# Patient Record
Sex: Male | Born: 1955 | Race: White | Hispanic: No | Marital: Single | Smoking: Never smoker
Health system: Southern US, Community
[De-identification: ages and names within clinical notes are randomized; demographics above are authoritative.]

## PROBLEM LIST (undated history)

## (undated) DIAGNOSIS — E785 Hyperlipidemia, unspecified: Secondary | ICD-10-CM

## (undated) DIAGNOSIS — R7303 Prediabetes: Secondary | ICD-10-CM

## (undated) DIAGNOSIS — F419 Anxiety disorder, unspecified: Secondary | ICD-10-CM

## (undated) DIAGNOSIS — G20 Parkinson's disease: Secondary | ICD-10-CM

## (undated) DIAGNOSIS — G20A1 Parkinson's disease without dyskinesia, without mention of fluctuations: Secondary | ICD-10-CM

## (undated) DIAGNOSIS — I2510 Atherosclerotic heart disease of native coronary artery without angina pectoris: Secondary | ICD-10-CM

## (undated) DIAGNOSIS — I10 Essential (primary) hypertension: Secondary | ICD-10-CM

## (undated) HISTORY — PX: CHOLECYSTECTOMY: SHX55

## (undated) HISTORY — PX: CORONARY ANGIOPLASTY: SHX604

## (undated) HISTORY — DX: Parkinson's disease: G20

## (undated) HISTORY — DX: Atherosclerotic heart disease of native coronary artery without angina pectoris: I25.10

## (undated) HISTORY — DX: Anxiety disorder, unspecified: F41.9

## (undated) HISTORY — PX: CARDIAC CATHETERIZATION: SHX172

## (undated) HISTORY — PX: TONSILLECTOMY: SUR1361

## (undated) HISTORY — PX: EYE SURGERY: SHX253

## (undated) HISTORY — DX: Hyperlipidemia, unspecified: E78.5

## (undated) HISTORY — DX: Prediabetes: R73.03

## (undated) HISTORY — DX: Essential (primary) hypertension: I10

## (undated) HISTORY — DX: Parkinson's disease without dyskinesia, without mention of fluctuations: G20.A1

## (undated) HISTORY — PX: CORONARY ARTERY BYPASS GRAFT: SHX141

## (undated) HISTORY — PX: APPENDECTOMY: SHX54

## (undated) HISTORY — PX: NASAL SEPTUM SURGERY: SHX37

---

## 2005-12-24 ENCOUNTER — Ambulatory Visit: Payer: Self-pay | Admitting: Family Medicine

## 2006-01-17 ENCOUNTER — Ambulatory Visit: Payer: Self-pay | Admitting: Family Medicine

## 2006-11-01 ENCOUNTER — Ambulatory Visit: Payer: Self-pay | Admitting: Unknown Physician Specialty

## 2007-05-13 ENCOUNTER — Ambulatory Visit: Payer: Self-pay | Admitting: Family Medicine

## 2007-05-16 ENCOUNTER — Ambulatory Visit: Payer: Self-pay | Admitting: Family Medicine

## 2007-06-11 ENCOUNTER — Inpatient Hospital Stay: Payer: Self-pay | Admitting: Internal Medicine

## 2007-06-11 ENCOUNTER — Other Ambulatory Visit: Payer: Self-pay

## 2007-06-12 ENCOUNTER — Other Ambulatory Visit: Payer: Self-pay

## 2007-08-20 ENCOUNTER — Encounter: Payer: Self-pay | Admitting: Cardiology

## 2007-09-03 ENCOUNTER — Encounter: Payer: Self-pay | Admitting: Cardiology

## 2009-07-10 ENCOUNTER — Inpatient Hospital Stay: Payer: Self-pay | Admitting: Internal Medicine

## 2010-12-20 ENCOUNTER — Ambulatory Visit: Payer: Self-pay | Admitting: Family Medicine

## 2012-01-02 ENCOUNTER — Ambulatory Visit: Payer: Self-pay | Admitting: Family Medicine

## 2012-05-02 ENCOUNTER — Ambulatory Visit: Payer: Self-pay | Admitting: Family Medicine

## 2013-07-13 ENCOUNTER — Ambulatory Visit: Payer: Self-pay | Admitting: Family Medicine

## 2014-07-01 ENCOUNTER — Ambulatory Visit: Payer: Self-pay | Admitting: Family Medicine

## 2015-07-04 HISTORY — PX: CAROTID STENT: SHX1301

## 2015-07-12 ENCOUNTER — Ambulatory Visit: Payer: Self-pay

## 2015-07-15 ENCOUNTER — Other Ambulatory Visit: Payer: Self-pay | Admitting: Neurology

## 2015-07-15 DIAGNOSIS — R1310 Dysphagia, unspecified: Secondary | ICD-10-CM

## 2015-07-19 ENCOUNTER — Other Ambulatory Visit: Payer: Self-pay

## 2015-07-25 ENCOUNTER — Encounter: Payer: BLUE CROSS/BLUE SHIELD | Attending: Cardiology | Admitting: *Deleted

## 2015-07-25 ENCOUNTER — Other Ambulatory Visit: Payer: Self-pay | Admitting: *Deleted

## 2015-07-25 VITALS — Ht 70.4 in | Wt 203.3 lb

## 2015-07-25 DIAGNOSIS — Z955 Presence of coronary angioplasty implant and graft: Secondary | ICD-10-CM | POA: Diagnosis not present

## 2015-07-25 NOTE — Patient Instructions (Signed)
Patient Instructions  Patient Details  Name: Sean Barajas MRN: [id number] Date of Birth: 01-28-1956 Referring Provider:  Marcina Millard, MD  Below are the personal goals you chose as well as exercise and nutrition goals. Our goal is to help you keep on track towards obtaining and maintaining your goals. We will be discussing your progress on these goals with you throughout the program.  Initial Exercise Prescription:     Initial Exercise Prescription - 07/25/15 1400    Date of Initial Exercise Prescription   Date 07/25/15   Treadmill   MPH 2.5   Grade 0   Minutes 15   Bike   Level 0.4   Minutes 10   Recumbant Bike   Level 3   RPM 50   Watts 25   Minutes 10   NuStep   Level 3   Watts 40   Minutes 15   Arm Ergometer   Level 1   Watts 8   Minutes 10   Arm/Foot Ergometer   Level 4   Watts 15   Minutes 10   Cybex   Level 1   RPM 50   Minutes 10   Recumbant Elliptical   Level 1   RPM 40   Watts 10   Minutes 10   Elliptical   Level 1   Speed 3   Minutes 1   REL-XR   Level 2   Watts 35   Minutes 15   Prescription Details   Frequency (times per week) 3   Duration Progress to 30 minutes of continuous aerobic without signs/symptoms of physical distress   Intensity   THRR REST +  30   Ratings of Perceived Exertion 11-15   Progression Continue progressive overload as per policy without signs/symptoms or physical distress.   Resistance Training   Training Prescription Yes   Weight 2   Reps 10-15      Exercise Goals: Frequency: Be able to perform aerobic exercise three times per week working toward 3-5 days per week.  Intensity: Work with a perceived exertion of 11 (fairly light) - 15 (hard) as tolerated. Follow your new exercise prescription and watch for changes in prescription as you progress with the program. Changes will be reviewed with you when they are made.  Duration: You should be able to do 30 minutes of continuous aerobic exercise in  addition to a 5 minute warm-up and a 5 minute cool-down routine.  Nutrition Goals: Your personal nutrition goals will be established when you do your nutrition analysis with the dietician.  The following are nutrition guidelines to follow: Cholesterol < /day Sodium < /day Fiber: Men over 50 yrs - 30 grams per day  Personal Goals:     Personal Goals and Risk Factors at Admission - 07/25/15 1531    Personal Goals and Risk Factors on Admission    Weight Management No   Increase Aerobic Exercise and Physical Activity Yes   Intervention While in program, learn and follow the exercise prescription taught. Start at a low level workload and increase workload after able to maintain previous level for 30 minutes. Increase time before increasing intensity.   Quit Smoking No   Understand more about Heart/Pulmonary Disease. Yes   Intervention While in program utilize professionals for any questions, and attend the education sessions. Great websites to use are www.americanheart.org or www.lung.org for reliable information.   Diabetes No   Hypertension Yes   Goal Participant will see blood pressure controlled within the values  of 140/35mm/Hg or within value directed by their physician.   Intervention Provide nutrition & aerobic exercise along with prescribed medications to achieve BP 140/90 or less.   Lipids Yes   Goal Cholesterol controlled with medications as prescribed, with individualized exercise RX and with personalized nutrition plan. Value goals: LDL < 70mg , HDL > 40mg . Participant states understanding of desired cholesterol values and following prescriptions.   Intervention Provide nutrition & aerobic exercise along with prescribed medications to achieve LDL 70mg , HDL >40mg .   Stress Yes   Goal To meet with psychosocial counselor for stress and relaxation information and guidance. To state understanding of performing relaxation techniques and or identifying personal stressors.    Intervention Provide education on types of stress, identifiying stressors, and ways to cope with stress. Provide demonstration and active practice of relaxation techniques.   Personal Goal Other No      Tobacco Use Initial Evaluation: History  Smoking status  . Never Smoker   Smokeless tobacco  . Not on file    Copy of goals given to participant.

## 2015-07-25 NOTE — Progress Notes (Signed)
Cardiac Individual Treatment Plan  Patient Details  Name: Sean Barajas MRN: [id number] Date of Birth: 1956-06-08 Referring Provider:  Marcina Millard, MD  Initial Encounter Date: Date: 07/25/15  Visit Diagnosis: No diagnosis found.  Patient's Home Medications on Admission:  Current outpatient prescriptions:  .  albuterol (PROAIR HFA) 108 (90 BASE) MCG/ACT inhaler, Inhale into the lungs., Disp: , Rfl:  .  ARTIFICIAL TEARS 0.1-0.3 % SOLN, Apply to eye., Disp: , Rfl:  .  aspirin EC 81 MG tablet, Take by mouth., Disp: , Rfl:  .  carbidopa-levodopa (SINEMET IR) 25-100 MG per tablet, Take by mouth., Disp: , Rfl:  .  carvedilol (COREG) 3.125 MG tablet, Take by mouth., Disp: , Rfl:  .  nitroGLYCERIN (NITROSTAT) 0.4 MG SL tablet, Place under the tongue., Disp: , Rfl:  .  rosuvastatin (CRESTOR) 20 MG tablet, Take by mouth., Disp: , Rfl:  .  sildenafil (VIAGRA) 100 MG tablet, Take by mouth., Disp: , Rfl:  .  ticagrelor (BRILINTA) 90 MG TABS tablet, Take by mouth., Disp: , Rfl:  .  traZODone (DESYREL) 50 MG tablet, Take by mouth., Disp: , Rfl:  .  valACYclovir (VALTREX) 500 MG tablet, 500 mg 2 (two) times daily. , Disp: , Rfl:   Past Medical History: No past medical history on file.  Tobacco Use: History  Smoking status  . Not on file  Smokeless tobacco  . Not on file    Labs: Recent Review Flowsheet Data    There is no flowsheet data to display.       Exercise Target Goals: Date: 07/25/15  Exercise Program Goal: Individual exercise prescription set with THRR, safety & activity barriers. Participant demonstrates ability to understand and report RPE using BORG scale, to self-measure pulse accurately, and to acknowledge the importance of the exercise prescription.  Exercise Prescription Goal: Starting with aerobic activity 30 plus minutes a day, 3 days per week for initial exercise prescription. Provide home exercise prescription and guidelines that participant  acknowledges understanding prior to discharge.  Activity Barriers & Risk Stratification:   6 Minute Walk:     6 Minute Walk      07/25/15 1413       6 Minute Walk   Phase Initial     Walk Time 6 minutes     Resting HR 58 bpm     Resting BP 138/84 mmHg        Initial Exercise Prescription:     Initial Exercise Prescription - 07/25/15 1400    Date of Initial Exercise Prescription   Date 07/25/15   Bike   Level 0.4   Minutes 10   Recumbant Bike   Level 3   RPM 50   Watts 25   Minutes 10   NuStep   Level 3   Watts 40   Minutes 15   Arm Ergometer   Level 1   Watts 8   Minutes 10   Arm/Foot Ergometer   Level 4   Watts 15   Minutes 10   Cybex   Level 1   RPM 50   Minutes 10   Recumbant Elliptical   Level 1   RPM 40   Watts 10   Minutes 10   Elliptical   Level 1   Speed 3   Minutes 1   REL-XR   Level 2   Watts 35   Minutes 15      Exercise Prescription Changes:   Discharge Exercise Prescription (Final Exercise Prescription Changes):  Nutrition:  Target Goals: Understanding of nutrition guidelines, daily intake of sodium 1500mg , cholesterol 200mg , calories 30% from fat and 7% or less from saturated fats, daily to have 5 or more servings of fruits and vegetables.  Biometrics:     Pre Biometrics - 07/25/15 1406    Pre Biometrics   Height 5' 10.4" (1.788 m)   Weight 203 lb 4.8 oz (92.216 kg)   Waist Circumference 37.75 inches   Hip Circumference 39.75 inches   Waist to Hip Ratio 0.95 %   BMI (Calculated) 28.9       Nutrition Therapy Plan and Nutrition Goals:   Nutrition Discharge: Rate Your Plate Scores:   Nutrition Goals Re-Evaluation:   Psychosocial: Target Goals: Acknowledge presence or absence of depression, maximize coping skills, provide positive support system. Participant is able to verbalize types and ability to use techniques and skills needed for reducing stress and depression.  Initial Review & Psychosocial  Screening:   Quality of Life Scores:   PHQ-9:     Recent Review Flowsheet Data    There is no flowsheet data to display.      Psychosocial Evaluation and Intervention:   Psychosocial Re-Evaluation:   Vocational Rehabilitation: Provide vocational rehab assistance to qualifying candidates.   Vocational Rehab Evaluation & Intervention:   Education: Education Goals: Education classes will be provided on a weekly basis, covering required topics. Participant will state understanding/return demonstration of topics presented.  Learning Barriers/Preferences:   Education Topics: General Nutrition Guidelines/Fats and Fiber: -Group instruction provided by verbal, written material, models and posters to present the general guidelines for heart healthy nutrition. Gives an explanation and review of dietary fats and fiber.   Controlling Sodium/Reading Food Labels: -Group verbal and written material supporting the discussion of sodium use in heart healthy nutrition. Review and explanation with models, verbal and written materials for utilization of the food label.   Exercise Physiology & Risk Factors: - Group verbal and written instruction with models to review the exercise physiology of the cardiovascular system and associated critical values. Details cardiovascular disease risk factors and the goals associated with each risk factor.   Aerobic Exercise & Resistance Training: - Gives group verbal and written discussion on the health impact of inactivity. On the components of aerobic and resistive training programs and the benefits of this training and how to safely progress through these programs.   Flexibility, Balance, General Exercise Guidelines: - Provides group verbal and written instruction on the benefits of flexibility and balance training programs. Provides general exercise guidelines with specific guidelines to those with heart or lung disease. Demonstration and skill  practice provided.   Stress Management: - Provides group verbal and written instruction about the health risks of elevated stress, cause of high stress, and healthy ways to reduce stress.   Depression: - Provides group verbal and written instruction on the correlation between heart/lung disease and depressed mood, treatment options, and the stigmas associated with seeking treatment.   Anatomy & Physiology of the Heart: - Group verbal and written instruction and models provide basic cardiac anatomy and physiology, with the coronary electrical and arterial systems. Review of: AMI, Angina, Valve disease, Heart Failure, Cardiac Arrhythmia, Pacemakers, and the ICD.   Cardiac Procedures: - Group verbal and written instruction and models to describe the testing methods done to diagnose heart disease. Reviews the outcomes of the test results. Describes the treatment choices: Medical Management, Angioplasty, or Coronary Bypass Surgery.   Cardiac Medications: - Group verbal and written instruction to  review commonly prescribed medications for heart disease. Reviews the medication, class of the drug, and side effects. Includes the steps to properly store meds and maintain the prescription regimen.   Go Sex-Intimacy & Heart Disease, Get SMART - Goal Setting: - Group verbal and written instruction through game format to discuss heart disease and the return to sexual intimacy. Provides group verbal and written material to discuss and apply goal setting through the application of the S.M.A.R.T. Method.   Other Matters of the Heart: - Provides group verbal, written materials and models to describe Heart Failure, Angina, Valve Disease, and Diabetes in the realm of heart disease. Includes description of the disease process and treatment options available to the cardiac patient.   Exercise & Equipment Safety: - Individual verbal instruction and demonstration of equipment use and safety with use of the  equipment.   Infection Prevention: - Provides verbal and written material to individual with discussion of infection control including proper hand washing and proper equipment cleaning during exercise session.   Falls Prevention: - Provides verbal and written material to individual with discussion of falls prevention and safety.   Diabetes: - Individual verbal and written instruction to review signs/symptoms of diabetes, desired ranges of glucose level fasting, after meals and with exercise. Advice that pre and post exercise glucose checks will be done for 3 sessions at entry of program.    Knowledge Questionnaire Score:   Personal Goals and Risk Factors at Admission:   Personal Goals and Risk Factors Review:    Personal Goals Discharge:     Comments: Orientation session today.

## 2015-07-28 DIAGNOSIS — Z955 Presence of coronary angioplasty implant and graft: Secondary | ICD-10-CM | POA: Diagnosis not present

## 2015-07-28 NOTE — Progress Notes (Signed)
Daily Session Note  Patient Details  Name: CARLISLE ENKE MRN: [id number] Date of Birth: [birth year] Referring Provider:  Isaias Cowman, MD  Encounter Date: 07/28/2015  Check In:     Session Check In - 07/28/15 0903    Check-In   Staff Present Lestine Box BS, ACSM EP-C, Exercise Physiologist;Carroll Enterkin RN, BSN;Other   ER physicians immediately available to respond to emergencies See telemetry face sheet for immediately available ER MD   Medication changes reported     No   Fall or balance concerns reported    No   Warm-up and Cool-down Performed on first and last piece of equipment   VAD Patient? No   Pain Assessment   Currently in Pain? No/denies         Goals Met:  Proper associated with RPD/PD & O2 Sat Exercise tolerated well No report of cardiac concerns or symptoms Strength training completed today  Goals Unmet:  Not Applicable  Goals Comments:    Dr. Emily Filbert is Medical Director for [place] and [facility].

## 2015-08-02 ENCOUNTER — Encounter: Payer: BLUE CROSS/BLUE SHIELD | Admitting: *Deleted

## 2015-08-02 DIAGNOSIS — Z955 Presence of coronary angioplasty implant and graft: Secondary | ICD-10-CM

## 2015-08-02 NOTE — Progress Notes (Signed)
Daily Session Note  Patient Details  Name: Mihran M Maldonado MRN: [id number] Date of Birth: 04/17/1956 Referring Provider:  Paraschos, Alexander, MD  Encounter Date: 08/02/2015  Check In:     Session Check In - 08/02/15 1041    Check-In   Staff Present Diane Wright RN, BSN;Renee MacMillan MS, ACSM CEP Exercise Physiologist;Other   ER physicians immediately available to respond to emergencies See telemetry face sheet for immediately available ER MD   Medication changes reported     No   Fall or balance concerns reported    No   Warm-up and Cool-down Performed on first and last piece of equipment   VAD Patient? No   Pain Assessment   Currently in Pain? No/denies   Multiple Pain Sites No         Goals Met:  Independence with exercise equipment Exercise tolerated well No report of cardiac concerns or symptoms Strength training completed today  Goals Unmet:  Not Applicable  Goals Comments:   Dr. Mark Miller is Medical Director for [facility] and [facility]. 

## 2015-08-04 ENCOUNTER — Encounter: Payer: BLUE CROSS/BLUE SHIELD | Attending: Cardiology

## 2015-08-04 DIAGNOSIS — Z955 Presence of coronary angioplasty implant and graft: Secondary | ICD-10-CM

## 2015-08-04 NOTE — Progress Notes (Signed)
Daily Session Note  Patient Details  Name: Sean Barajas MRN: [id number] Date of Birth: [birth year] Referring Provider:  Isaias Cowman, MD  Encounter Date: 08/04/2015  Check In:     Session Check In - 08/04/15 0927    Check-In   Staff Present Lestine Box BS, ACSM EP-C, Exercise Physiologist;Other;Carroll Enterkin RN, BSN   ER physicians immediately available to respond to emergencies See telemetry face sheet for immediately available ER MD   Medication changes reported     No   Fall or balance concerns reported    No   Warm-up and Cool-down Performed on first and last piece of equipment   VAD Patient? No   Pain Assessment   Currently in Pain? No/denies         Goals Met:  Proper associated with RPD/PD & O2 Sat Exercise tolerated well No report of cardiac concerns or symptoms Strength training completed today  Goals Unmet:  Not Applicable  Goals Comments:    Dr. Emily Filbert is Medical Director for [place] and [facility].

## 2015-08-11 DIAGNOSIS — Z955 Presence of coronary angioplasty implant and graft: Secondary | ICD-10-CM | POA: Diagnosis not present

## 2015-08-11 NOTE — Progress Notes (Signed)
Daily Session Note  Patient Details  Name: Sean Barajas MRN: [id number] Date of Birth: Jan 08, 1956 Referring Provider:  Isaias Cowman, MD  Encounter Date: 08/11/2015  Check In:     Session Check In - 08/11/15 0925    Check-In   Staff Present Nyoka Cowden RN;Steven Way BS, ACSM EP-C, Exercise Physiologist;Other   ER physicians immediately available to respond to emergencies See telemetry face sheet for immediately available ER MD   Medication changes reported     No   Fall or balance concerns reported    No   Warm-up and Cool-down Performed on first and last piece of equipment   VAD Patient? No           Exercise Prescription Changes - 08/11/15 0900    Recumbant Bike   Level 4   RPM 65   Minutes 15      Goals Met:  Independence with exercise equipment Exercise tolerated well No report of cardiac concerns or symptoms Strength training completed today  Goals Unmet:  Not Applicable  Goals Comments:    Dr. Emily Filbert is Medical Director for [place] [facility].

## 2015-08-16 ENCOUNTER — Ambulatory Visit: Payer: BLUE CROSS/BLUE SHIELD | Attending: Neurology

## 2015-08-16 ENCOUNTER — Encounter: Payer: Self-pay | Admitting: *Deleted

## 2015-08-16 DIAGNOSIS — Z955 Presence of coronary angioplasty implant and graft: Secondary | ICD-10-CM

## 2015-08-16 DIAGNOSIS — G4733 Obstructive sleep apnea (adult) (pediatric): Secondary | ICD-10-CM | POA: Insufficient documentation

## 2015-08-16 NOTE — Progress Notes (Signed)
Cardiac Individual Treatment Plan  Patient Details  Name: Sean Barajas MRN: [id number] Date of Birth: 04-22-1956 Referring Provider:  Marcina Millard, MD  Initial Encounter Date:    Visit Diagnosis: Stented coronary artery  Patient's Home Medications on Admission:  Current outpatient prescriptions:  .  albuterol (PROAIR HFA) 108 (90 BASE) MCG/ACT inhaler, Inhale into the lungs., Disp: , Rfl:  .  ARTIFICIAL TEARS 0.1-0.3 % SOLN, Apply to eye., Disp: , Rfl:  .  aspirin EC 81 MG tablet, Take by mouth., Disp: , Rfl:  .  carbidopa-levodopa (SINEMET IR) 25-100 MG per tablet, Take by mouth., Disp: , Rfl:  .  carvedilol (COREG) 3.125 MG tablet, Take by mouth., Disp: , Rfl:  .  nitroGLYCERIN (NITROSTAT) 0.4 MG SL tablet, Place under the tongue., Disp: , Rfl:  .  rosuvastatin (CRESTOR) 20 MG tablet, Take by mouth., Disp: , Rfl:  .  sildenafil (VIAGRA) 100 MG tablet, Take by mouth., Disp: , Rfl:  .  ticagrelor (BRILINTA) 90 MG TABS tablet, Take by mouth., Disp: , Rfl:  .  traZODone (DESYREL) 50 MG tablet, Take by mouth., Disp: , Rfl:  .  valACYclovir (VALTREX) 500 MG tablet, 500 mg 2 (two) times daily. , Disp: , Rfl:   Past Medical History: Past Medical History  Diagnosis Date  . Coronary artery disease   . Hypertension   . Hyperlipidemia     Tobacco Use: History  Smoking status  . Never Smoker   Smokeless tobacco  . Not on file    Labs: Recent Review Flowsheet Data    There is no flowsheet data to display.       Exercise Target Goals:    Exercise Program Goal: Individual exercise prescription set with THRR, safety & activity barriers. Participant demonstrates ability to understand and report RPE using BORG scale, to self-measure pulse accurately, and to acknowledge the importance of the exercise prescription.  Exercise Prescription Goal: Starting with aerobic activity 30 plus minutes a day, 3 days per week for initial exercise prescription. Provide home exercise  prescription and guidelines that participant acknowledges understanding prior to discharge.  Activity Barriers & Risk Stratification:     Activity Barriers & Risk Stratification - 07/25/15 1530    Activity Barriers & Risk Stratification   Activity Barriers Back Problems;Balance Concerns;History of Falls   Risk Stratification High      6 Minute Walk:     6 Minute Walk      07/25/15 1413       6 Minute Walk   Phase Initial     Distance 1500 feet     Walk Time 6 minutes     Resting HR 58 bpm     Resting BP 138/84 mmHg     Max Ex. HR 95 bpm     Max Ex. BP 136/80 mmHg     RPE 11     Symptoms No        Initial Exercise Prescription:     Initial Exercise Prescription - 07/25/15 1400    Date of Initial Exercise Prescription   Date 07/25/15   Treadmill   MPH 2.5   Grade 0   Minutes 15   Bike   Level 0.4   Minutes 10   Recumbant Bike   Level 3   RPM 50   Watts 25   Minutes 10   NuStep   Level 3   Watts 40   Minutes 15   Arm Ergometer   Level 1  Watts 8   Minutes 10   Arm/Foot Ergometer   Level 4   Watts 15   Minutes 10   Cybex   Level 1   RPM 50   Minutes 10   Recumbant Elliptical   Level 1   RPM 40   Watts 10   Minutes 10   Elliptical   Level 1   Speed 3   Minutes 1   REL-XR   Level 2   Watts 35   Minutes 15   Prescription Details   Frequency (times per week) 3   Duration Progress to 30 minutes of continuous aerobic without signs/symptoms of physical distress   Intensity   THRR REST +  30   Ratings of Perceived Exertion 11-15   Progression Continue progressive overload as per policy without signs/symptoms or physical distress.   Resistance Training   Training Prescription Yes   Weight 2   Reps 10-15      Exercise Prescription Changes:     Exercise Prescription Changes      08/11/15 0900 08/11/15 1300         Exercise Review   Progression  Yes      Response to Exercise   Blood Pressure (Admit)  118/74 mmHg      Blood  Pressure (Exercise)  146/86 mmHg      Blood Pressure (Exit)  118/64 mmHg      Heart Rate (Admit)  62 bpm      Heart Rate (Exercise)  98 bpm      Heart Rate (Exit)  58 bpm      Rating of Perceived Exertion (Exercise)  12      Duration  Progress to 30 minutes of continuous aerobic without signs/symptoms of physical distress      Intensity  Rest + 30      Progression  Continue progressive overload as per policy without signs/symptoms or physical distress.      Resistance Training   Training Prescription  Yes      Weight  4      Reps  10-15      Interval Training   Interval Training  No      Treadmill   MPH  3      Grade  0      Minutes  15      Recumbant Bike   Level 4 4      RPM 65 65      Minutes 15 15      NuStep   Level  4      Watts  50      Minutes  15         Discharge Exercise Prescription (Final Exercise Prescription Changes):     Exercise Prescription Changes - 08/11/15 1300    Exercise Review   Progression Yes   Response to Exercise   Blood Pressure (Admit) 118/74 mmHg   Blood Pressure (Exercise) 146/86 mmHg   Blood Pressure (Exit) 118/64 mmHg   Heart Rate (Admit) 62 bpm   Heart Rate (Exercise) 98 bpm   Heart Rate (Exit) 58 bpm   Rating of Perceived Exertion (Exercise) 12   Duration Progress to 30 minutes of continuous aerobic without signs/symptoms of physical distress   Intensity Rest + 30   Progression Continue progressive overload as per policy without signs/symptoms or physical distress.   Resistance Training   Training Prescription Yes   Weight 4   Reps 10-15   Interval Training  Interval Training No   Treadmill   MPH 3   Grade 0   Minutes 15   Recumbant Bike   Level 4   RPM 65   Minutes 15   NuStep   Level 4   Watts 50   Minutes 15      Nutrition:  Target Goals: Understanding of nutrition guidelines, daily intake of sodium 1500mg , cholesterol 200mg , calories 30% from fat and 7% or less from saturated fats, daily to have 5 or more  servings of fruits and vegetables.  Biometrics:     Pre Biometrics - 07/25/15 1406    Pre Biometrics   Height 5' 10.4" (1.788 m)   Weight 203 lb 4.8 oz (92.216 kg)   Waist Circumference 37.75 inches   Hip Circumference 39.75 inches   Waist to Hip Ratio 0.95 %   BMI (Calculated) 28.9       Nutrition Therapy Plan and Nutrition Goals:     Nutrition Therapy & Goals - 07/25/15 1529    Nutrition Therapy   Drug/Food Interactions Statins/Certain Fruits   Intervention Plan   Intervention Using nutrition plan and personal goals to gain a healthy nutrition lifestyle. Add exercise as prescribed.      Nutrition Discharge: Rate Your Plate Scores:   Nutrition Goals Re-Evaluation:   Psychosocial: Target Goals: Acknowledge presence or absence of depression, maximize coping skills, provide positive support system. Participant is able to verbalize types and ability to use techniques and skills needed for reducing stress and depression.  Initial Review & Psychosocial Screening:   Quality of Life Scores:     Quality of Life - 07/25/15 1549    Quality of Life Scores   Health/Function Pre 14.67 %   Socioeconomic Pre 24.21 %   Psych/Spiritual Pre 21.07 %   Family Pre 22.5 %   GLOBAL Pre 19 %      PHQ-9:     Recent Review Flowsheet Data    Depression screen [facility] 2/9 07/25/2015   Decreased Interest 2   Down, Depressed, Hopeless 0   PHQ - 2 Score 2   Altered sleeping 3   Tired, decreased energy 3   Change in appetite 0   Feeling bad or failure about yourself  0   Trouble concentrating 0   Moving slowly or fidgety/restless 3   Suicidal thoughts 0   PHQ-9 Score 11   Difficult doing work/chores Somewhat difficult       Psychosocial Evaluation and Intervention:   Psychosocial Re-Evaluation:   Vocational Rehabilitation: Provide vocational rehab assistance to qualifying candidates.   Vocational Rehab Evaluation & Intervention:     Vocational Rehab - 07/25/15 1530     Initial Vocational Rehab Evaluation & Intervention   Assessment shows need for Vocational Rehabilitation No      Education: Education Goals: Education classes will be provided on a weekly basis, covering required topics. Participant will state understanding/return demonstration of topics presented.  Learning Barriers/Preferences:     Learning Barriers/Preferences - 07/25/15 1530    Learning Barriers/Preferences   Learning Barriers None   Learning Preferences None      Education Topics: General Nutrition Guidelines/Fats and Fiber: -Group instruction provided by verbal, written material, models and posters to present the general guidelines for heart healthy nutrition. Gives an explanation and review of dietary fats and fiber.   Controlling Sodium/Reading Food Labels: -Group verbal and written material supporting the discussion of sodium use in heart healthy nutrition. Review and explanation with models, verbal and written materials  for utilization of the food label.   Exercise Physiology & Risk Factors: - Group verbal and written instruction with models to review the exercise physiology of the cardiovascular system and associated critical values. Details cardiovascular disease risk factors and the goals associated with each risk factor.          Cardiac Rehab from 08/11/2015 in [facility] Cardiac Re[facility]   Date  08/11/15   Educator  SW   Instruction Review Code  2- meets goals/outcomes      Aerobic Exercise & Resistance Training: - Gives group verbal and written discussion on the health impact of inactivity. On the components of aerobic and resistive training programs and the benefits of this training and how to safely progress through these programs.   Flexibility, Balance, General Exercise Guidelines: - Provides group verbal and written instruction on the benefits of flexibility and balance training programs. Provides general exercise guidelines with specific guidelines to those with  heart or lung disease. Demonstration and skill practice provided.   Stress Management: - Provides group verbal and written instruction about the health risks of elevated stress, cause of high stress, and healthy ways to reduce stress.   Depression: - Provides group verbal and written instruction on the correlation between heart/lung disease and depressed mood, treatment options, and the stigmas associated with seeking treatment.   Anatomy & Physiology of the Heart: - Group verbal and written instruction and models provide basic cardiac anatomy and physiology, with the coronary electrical and arterial systems. Review of: AMI, Angina, Valve disease, Heart Failure, Cardiac Arrhythmia, Pacemakers, and the ICD.   Cardiac Procedures: - Group verbal and written instruction and models to describe the testing methods done to diagnose heart disease. Reviews the outcomes of the test results. Describes the treatment choices: Medical Management, Angioplasty, or Coronary Bypass Surgery.   Cardiac Medications: - Group verbal and written instruction to review commonly prescribed medications for heart disease. Reviews the medication, class of the drug, and side effects. Includes the steps to properly store meds and maintain the prescription regimen.   Go Sex-Intimacy & Heart Disease, Get SMART - Goal Setting: - Group verbal and written instruction through game format to discuss heart disease and the return to sexual intimacy. Provides group verbal and written material to discuss and apply goal setting through the application of the S.M.A.R.T. Method.   Other Matters of the Heart: - Provides group verbal, written materials and models to describe Heart Failure, Angina, Valve Disease, and Diabetes in the realm of heart disease. Includes description of the disease process and treatment options available to the cardiac patient.   Exercise & Equipment Safety: - Individual verbal instruction and  demonstration of equipment use and safety with use of the equipment.      Cardiac Rehab from 08/11/2015 in [facility] Card[facility]   Date  07/25/15   Educator  C. Enterkin,RN   Instruction Review Code  1- partially meets, needs review/practice      Infection Prevention: - Provides verbal and written material to individual with discussion of infection control including proper hand washing and proper equipment cleaning during exercise session.      Cardiac Rehab from 08/11/2015 in [facility]   Date  07/25/15   Educator  C. Enterkin,RN   Instruction Review Code  2- meets goals/outcomes      Falls Prevention: - Provides verbal and written material to individual with discussion of falls prevention and safety.      Cardiac Rehab from 08/11/2015 in [facility]  Cardiac Rehab   Date  07/25/15   Educator  C. Enterkin,RN   Instruction Review Code  2- meets goals/outcomes      Diabetes: - Individual verbal and written instruction to review signs/symptoms of diabetes, desired ranges of glucose level fasting, after meals and with exercise. Advice that pre and post exercise glucose checks will be done for 3 sessions at entry of program.    Knowledge Questionnaire Score:     Knowledge Questionnaire Score - 07/25/15 1427    Knowledge Questionnaire Score   Pre Score 22      Personal Goals and Risk Factors at Admission:     Personal Goals and Risk Factors at Admission - 07/25/15 1531    Personal Goals and Risk Factors on Admission    Weight Management No   Increase Aerobic Exercise and Physical Activity Yes   Intervention While in program, learn and follow the exercise prescription taught. Start at a low level workload and increase workload after able to maintain previous level for 30 minutes. Increase time before increasing intensity.   Quit Smoking No   Understand more about Heart/Pulmonary Disease. Yes   Intervention While in program utilize professionals for any questions, and attend the  education sessions. Great websites to use are www.americanheart.org or www.lung.org for reliable information.   Diabetes No   Hypertension Yes   Goal Participant will see blood pressure controlled within the values of 140/15mm/Hg or within value directed by their physician.   Intervention Provide nutrition & aerobic exercise along with prescribed medications to achieve BP 140/90 or less.   Lipids Yes   Goal Cholesterol controlled with medications as prescribed, with individualized exercise RX and with personalized nutrition plan. Value goals: LDL < 70mg , HDL > 40mg . Participant states understanding of desired cholesterol values and following prescriptions.   Intervention Provide nutrition & aerobic exercise along with prescribed medications to achieve LDL 70mg , HDL >40mg .   Stress Yes   Goal To meet with psychosocial counselor for stress and relaxation information and guidance. To state understanding of performing relaxation techniques and or identifying personal stressors.   Intervention Provide education on types of stress, identifiying stressors, and ways to cope with stress. Provide demonstration and active practice of relaxation techniques.   Personal Goal Other No      Personal Goals and Risk Factors Review:    Personal Goals Discharge:     Comments: 30 day review  Has attended 3 sessions continue with ITP.

## 2015-08-17 ENCOUNTER — Other Ambulatory Visit: Payer: Self-pay | Admitting: *Deleted

## 2015-08-17 DIAGNOSIS — Z955 Presence of coronary angioplasty implant and graft: Secondary | ICD-10-CM

## 2015-09-06 ENCOUNTER — Encounter: Payer: BLUE CROSS/BLUE SHIELD | Attending: Cardiology

## 2015-09-06 DIAGNOSIS — Z955 Presence of coronary angioplasty implant and graft: Secondary | ICD-10-CM | POA: Insufficient documentation

## 2015-09-08 ENCOUNTER — Telehealth: Payer: Self-pay | Admitting: *Deleted

## 2015-09-08 ENCOUNTER — Encounter: Payer: Self-pay | Admitting: *Deleted

## 2015-09-08 DIAGNOSIS — Z955 Presence of coronary angioplasty implant and graft: Secondary | ICD-10-CM

## 2015-09-08 NOTE — Telephone Encounter (Signed)
I called Sean Barajas and he says he still wants to be on the Cardiac Rehab roster but his MD is sending him for balance retraining for his Parkinson's disease.

## 2015-09-08 NOTE — Progress Notes (Signed)
Cardiac Individual Treatment Plan  Patient Details  Name: Sean Barajas MRN: [id number] Date of Birth: 1956-10-25 Referring Provider:  No ref. provider found  Initial Encounter Date:    Visit Diagnosis: Stented coronary artery  Patient's Home Medications on Admission:  Current outpatient prescriptions:  .  albuterol (PROAIR HFA) 108 (90 BASE) MCG/ACT inhaler, Inhale into the lungs., Disp: , Rfl:  .  ARTIFICIAL TEARS 0.1-0.3 % SOLN, Apply to eye., Disp: , Rfl:  .  aspirin EC 81 MG tablet, Take by mouth., Disp: , Rfl:  .  carbidopa-levodopa (SINEMET IR) 25-100 MG per tablet, Take by mouth., Disp: , Rfl:  .  carvedilol (COREG) 3.125 MG tablet, Take by mouth., Disp: , Rfl:  .  nitroGLYCERIN (NITROSTAT) 0.4 MG SL tablet, Place under the tongue., Disp: , Rfl:  .  rosuvastatin (CRESTOR) 20 MG tablet, Take by mouth., Disp: , Rfl:  .  sildenafil (VIAGRA) 100 MG tablet, Take by mouth., Disp: , Rfl:  .  ticagrelor (BRILINTA) 90 MG TABS tablet, Take by mouth., Disp: , Rfl:  .  traZODone (DESYREL) 50 MG tablet, Take by mouth., Disp: , Rfl:  .  valACYclovir (VALTREX) 500 MG tablet, 500 mg 2 (two) times daily. , Disp: , Rfl:   Past Medical History: Past Medical History  Diagnosis Date  . Coronary artery disease   . Hypertension   . Hyperlipidemia     Tobacco Use: History  Smoking status  . Never Smoker   Smokeless tobacco  . Not on file    Labs: Recent Review Flowsheet Data    There is no flowsheet data to display.       Exercise Target Goals:    Exercise Program Goal: Individual exercise prescription set with THRR, safety & activity barriers. Participant demonstrates ability to understand and report RPE using BORG scale, to self-measure pulse accurately, and to acknowledge the importance of the exercise prescription.  Exercise Prescription Goal: Starting with aerobic activity 30 plus minutes a day, 3 days per week for initial exercise prescription. Provide home exercise  prescription and guidelines that participant acknowledges understanding prior to discharge.  Activity Barriers & Risk Stratification:     Activity Barriers & Risk Stratification - 07/25/15 1530    Activity Barriers & Risk Stratification   Activity Barriers Back Problems;Balance Concerns;History of Falls   Risk Stratification High      6 Minute Walk:     6 Minute Walk      07/25/15 1413       6 Minute Walk   Phase Initial     Distance 1500 feet     Walk Time 6 minutes     Resting HR 58 bpm     Resting BP 138/84 mmHg     Max Ex. HR 95 bpm     Max Ex. BP 136/80 mmHg     RPE 11     Symptoms No        Initial Exercise Prescription:     Initial Exercise Prescription - 07/25/15 1400    Date of Initial Exercise Prescription   Date 07/25/15   Treadmill   MPH 2.5   Grade 0   Minutes 15   Bike   Level 0.4   Minutes 10   Recumbant Bike   Level 3   RPM 50   Watts 25   Minutes 10   NuStep   Level 3   Watts 40   Minutes 15   Arm Ergometer   Level 1  Watts 8   Minutes 10   Arm/Foot Ergometer   Level 4   Watts 15   Minutes 10   Cybex   Level 1   RPM 50   Minutes 10   Recumbant Elliptical   Level 1   RPM 40   Watts 10   Minutes 10   Elliptical   Level 1   Speed 3   Minutes 1   REL-XR   Level 2   Watts 35   Minutes 15   Prescription Details   Frequency (times per week) 3   Duration Progress to 30 minutes of continuous aerobic without signs/symptoms of physical distress   Intensity   THRR REST +  30   Ratings of Perceived Exertion 11-15   Progression Continue progressive overload as per policy without signs/symptoms or physical distress.   Resistance Training   Training Prescription Yes   Weight 2   Reps 10-15      Exercise Prescription Changes:     Exercise Prescription Changes      08/11/15 0900 08/11/15 1300         Exercise Review   Progression  Yes      Response to Exercise   Blood Pressure (Admit)  118/74 mmHg      Blood  Pressure (Exercise)  146/86 mmHg      Blood Pressure (Exit)  118/64 mmHg      Heart Rate (Admit)  62 bpm      Heart Rate (Exercise)  98 bpm      Heart Rate (Exit)  58 bpm      Rating of Perceived Exertion (Exercise)  12      Duration  Progress to 30 minutes of continuous aerobic without signs/symptoms of physical distress      Intensity  Rest + 30      Progression  Continue progressive overload as per policy without signs/symptoms or physical distress.      Resistance Training   Training Prescription  Yes      Weight  4      Reps  10-15      Interval Training   Interval Training  No      Treadmill   MPH  3      Grade  0      Minutes  15      Recumbant Bike   Level 4 4      RPM 65 65      Minutes 15 15      NuStep   Level  4      Watts  50      Minutes  15         Discharge Exercise Prescription (Final Exercise Prescription Changes):     Exercise Prescription Changes - 08/11/15 1300    Exercise Review   Progression Yes   Response to Exercise   Blood Pressure (Admit) 118/74 mmHg   Blood Pressure (Exercise) 146/86 mmHg   Blood Pressure (Exit) 118/64 mmHg   Heart Rate (Admit) 62 bpm   Heart Rate (Exercise) 98 bpm   Heart Rate (Exit) 58 bpm   Rating of Perceived Exertion (Exercise) 12   Duration Progress to 30 minutes of continuous aerobic without signs/symptoms of physical distress   Intensity Rest + 30   Progression Continue progressive overload as per policy without signs/symptoms or physical distress.   Resistance Training   Training Prescription Yes   Weight 4   Reps 10-15   Interval Training  Interval Training No   Treadmill   MPH 3   Grade 0   Minutes 15   Recumbant Bike   Level 4   RPM 65   Minutes 15   NuStep   Level 4   Watts 50   Minutes 15      Nutrition:  Target Goals: Understanding of nutrition guidelines, daily intake of sodium 1500mg , cholesterol 200mg , calories 30% from fat and 7% or less from saturated fats, daily to have 5 or more  servings of fruits and vegetables.  Biometrics:     Pre Biometrics - 07/25/15 1406    Pre Biometrics   Height 5' 10.4" (1.788 m)   Weight 203 lb 4.8 oz (92.216 kg)   Waist Circumference 37.75 inches   Hip Circumference 39.75 inches   Waist to Hip Ratio 0.95 %   BMI (Calculated) 28.9       Nutrition Therapy Plan and Nutrition Goals:     Nutrition Therapy & Goals - 07/25/15 1529    Nutrition Therapy   Drug/Food Interactions Statins/Certain Fruits   Intervention Plan   Intervention Using nutrition plan and personal goals to gain a healthy nutrition lifestyle. Add exercise as prescribed.      Nutrition Discharge: Rate Your Plate Scores:   Nutrition Goals Re-Evaluation:     Nutrition Goals Re-Evaluation      09/08/15 1107           Personal Goal #1 Re-Evaluation   Personal Goal #1 Sanjiv has been dealing with his Parkinson's disease and going to a neurologist who is going to send him for physical therapy. Has been out of Cardiac Rehab since 08/11/2015       Goal Progress Seen No          Psychosocial: Target Goals: Acknowledge presence or absence of depression, maximize coping skills, provide positive support system. Participant is able to verbalize types and ability to use techniques and skills needed for reducing stress and depression.  Initial Review & Psychosocial Screening:   Quality of Life Scores:     Quality of Life - 07/25/15 1549    Quality of Life Scores   Health/Function Pre 14.67 %   Socioeconomic Pre 24.21 %   Psych/Spiritual Pre 21.07 %   Family Pre 22.5 %   GLOBAL Pre 19 %      PHQ-9:     Recent Review Flowsheet Data    Depression screen [facility] 2/9 07/25/2015   Decreased Interest 2   Down, Depressed, Hopeless 0   PHQ - 2 Score 2   Altered sleeping 3   Tired, decreased energy 3   Change in appetite 0   Feeling bad or failure about yourself  0   Trouble concentrating 0   Moving slowly or fidgety/restless 3   Suicidal thoughts 0    PHQ-9 Score 11   Difficult doing work/chores Somewhat difficult       Psychosocial Evaluation and Intervention:   Psychosocial Re-Evaluation:     Psychosocial Re-Evaluation      09/08/15 1109           Psychosocial Re-Evaluation   Interventions Encouraged to attend Cardiac Rehabilitation for the exercise;Stress management education       Comments Stress due to "late stage Parkinson's disease [place] reports". Neal has been dealing with his Parkinson's disease and going to a neurologist who is going to send him for physical therapy. Has been out of Cardiac Rehab since 08/11/2015  Vocational Rehabilitation: Provide vocational rehab assistance to qualifying candidates.   Vocational Rehab Evaluation & Intervention:     Vocational Rehab - 07/25/15 1530    Initial Vocational Rehab Evaluation & Intervention   Assessment shows need for Vocational Rehabilitation No      Education: Education Goals: Education classes will be provided on a weekly basis, covering required topics. Participant will state understanding/return demonstration of topics presented.  Learning Barriers/Preferences:     Learning Barriers/Preferences - 07/25/15 1530    Learning Barriers/Preferences   Learning Barriers None   Learning Preferences None      Education Topics: General Nutrition Guidelines/Fats and Fiber: -Group instruction provided by verbal, written material, models and posters to present the general guidelines for heart healthy nutrition. Gives an explanation and review of dietary fats and fiber.   Controlling Sodium/Reading Food Labels: -Group verbal and written material supporting the discussion of sodium use in heart healthy nutrition. Review and explanation with models, verbal and written materials for utilization of the food label.   Exercise Physiology & Risk Factors: - Group verbal and written instruction with models to review the exercise physiology of the cardiovascular  system and associated critical values. Details cardiovascular disease risk factors and the goals associated with each risk factor.          Cardiac Rehab from 08/11/2015 in [facility] Cardiac Rehab   Date  08/11/15   Educator  SW   Instruction Review Code  2- meets goals/outcomes      Aerobic Exercise & Resistance Training: - Gives group verbal and written discussion on the health impact of inactivity. On the components of aerobic and resistive training programs and the benefits of this training and how to safely progress through these programs.   Flexibility, Balance, General Exercise Guidelines: - Provides group verbal and written instruction on the benefits of flexibility and balance training programs. Provides general exercise guidelines with specific guidelines to those with heart or lung disease. Demonstration and skill practice provided.   Stress Management: - Provides group verbal and written instruction about the health risks of elevated stress, cause of high stress, and healthy ways to reduce stress.   Depression: - Provides group verbal and written instruction on the correlation between heart/lung disease and depressed mood, treatment options, and the stigmas associated with seeking treatment.   Anatomy & Physiology of the Heart: - Group verbal and written instruction and models provide basic cardiac anatomy and physiology, with the coronary electrical and arterial systems. Review of: AMI, Angina, Valve disease, Heart Failure, Cardiac Arrhythmia, Pacemakers, and the ICD.   Cardiac Procedures: - Group verbal and written instruction and models to describe the testing methods done to diagnose heart disease. Reviews the outcomes of the test results. Describes the treatment choices: Medical Management, Angioplasty, or Coronary Bypass Surgery.   Cardiac Medications: - Group verbal and written instruction to review commonly prescribed medications for heart disease. Reviews the  medication, class of the drug, and side effects. Includes the steps to properly store meds and maintain the prescription regimen.   Go Sex-Intimacy & Heart Disease, Get SMART - Goal Setting: - Group verbal and written instruction through game format to discuss heart disease and the return to sexual intimacy. Provides group verbal and written material to discuss and apply goal setting through the application of the S.M.A.R.T. Method.   Other Matters of the Heart: - Provides group verbal, written materials and models to describe Heart Failure, Angina, Valve Disease, and Diabetes in the realm of heart  disease. Includes description of the disease process and treatment options available to the cardiac patient.   Exercise & Equipment Safety: - Individual verbal instruction and demonstration of equipment use and safety with use of the equipment.      Cardiac Rehab from 08/11/2015 in [facility] Cardiac Rehab   Date  07/25/15   Educator  C. Safiatou Islam,RN   Instruction Review Code  1- partially meets, needs review/practice      Infection Prevention: - Provides verbal and written material to individual with discussion of infection control including proper hand washing and proper equipment cleaning during exercise session.      Cardiac Rehab from 08/11/2015 in [facility] Cardiac Rehab   Date  07/25/15   Educator  C. Sanae Willetts,RN   Instruction Review Code  2- meets goals/outcomes      Falls Prevention: - Provides verbal and written material to individual with discussion of falls prevention and safety.      Cardiac Rehab from 08/11/2015 in [facility] Cardiac Rehab   Date  07/25/15   Educator  C. Wah Sabic,RN   Instruction Review Code  2- meets goals/outcomes      Diabetes: - Individual verbal and written instruction to review signs/symptoms of diabetes, desired ranges of glucose level fasting, after meals and with exercise. Advice that pre and post exercise glucose checks will be done for 3 sessions at entry of  program.    Knowledge Questionnaire Score:     Knowledge Questionnaire Score - 07/25/15 1427    Knowledge Questionnaire Score   Pre Score 22      Personal Goals and Risk Factors at Admission:     Personal Goals and Risk Factors at Admission - 07/25/15 1531    Personal Goals and Risk Factors on Admission    Weight Management No   Increase Aerobic Exercise and Physical Activity Yes   Intervention While in program, learn and follow the exercise prescription taught. Start at a low level workload and increase workload after able to maintain previous level for 30 minutes. Increase time before increasing intensity.   Quit Smoking No   Understand more about Heart/Pulmonary Disease. Yes   Intervention While in program utilize professionals for any questions, and attend the education sessions. Great websites to use are www.americanheart.org or www.lung.org for reliable information.   Diabetes No   Hypertension Yes   Goal Participant will see blood pressure controlled within the values of 140/9mm/Hg or within value directed by their physician.   Intervention Provide nutrition & aerobic exercise along with prescribed medications to achieve BP 140/90 or less.   Lipids Yes   Goal Cholesterol controlled with medications as prescribed, with individualized exercise RX and with personalized nutrition plan. Value goals: LDL < , HDL > . Participant states understanding of desired cholesterol values and following prescriptions.   Intervention Provide nutrition & aerobic exercise along with prescribed medications to achieve LDL 70mg , HDL >40mg .   Stress Yes   Goal To meet with psychosocial counselor for stress and relaxation information and guidance. To state understanding of performing relaxation techniques and or identifying personal stressors.   Intervention Provide education on types of stress, identifiying stressors, and ways to cope with stress. Provide demonstration and active practice of  relaxation techniques.   Personal Goal Other No      Personal Goals and Risk Factors Review:      Goals and Risk Factor Review      09/08/15 1108           Increase Aerobic  Exercise and Physical Activity   Goals Progress/Improvement seen  No       Comments Markis has been dealing with his Parkinson's disease and going to a neurologist who is going to send him for physical therapy. Has been out of Cardiac Rehab since 08/11/2015       Hypertension   Goal --  Bp checked at his neurology appt lately.        Stress   Goal --  Isahia has been dealing with his Parkinson's disease and going to a neurologist who is going to send him for physical therapy. Has been out of Cardiac Rehab since 08/11/2015          Personal Goals Discharge (Final Personal Goals and Risk Factors Review):      Goals and Risk Factor Review - 09/08/15 1108    Increase Aerobic Exercise and Physical Activity   Goals Progress/Improvement seen  No   Comments Laquinton has been dealing with his Parkinson's disease and going to a neurologist who is going to send him for physical therapy. Has been out of Cardiac Rehab since 08/11/2015   Hypertension   Goal --  Bp checked at his neurology appt lately.    Stress   Goal --  Earnest has been dealing with his Parkinson's disease and going to a neurologist who is going to send him for physical therapy. Has been out of Cardiac Rehab since 08/11/2015       Comments: Sopheap has been dealing with his Parkinson's disease and going to a neurologist who is going to send him for physical therapy. Has been out of Cardiac Rehab since 08/11/2015

## 2015-09-14 NOTE — Progress Notes (Signed)
Cardiac Individual Treatment Plan  Patient Details  Name: Sean Barajas MRN: [id number] Date of Birth: 1956-10-25 Referring Provider:  No ref. provider found  Initial Encounter Date:    Visit Diagnosis: Stented coronary artery  Patient's Home Medications on Admission:  Current outpatient prescriptions:  .  albuterol (PROAIR HFA) 108 (90 BASE) MCG/ACT inhaler, Inhale into the lungs., Disp: , Rfl:  .  ARTIFICIAL TEARS 0.1-0.3 % SOLN, Apply to eye., Disp: , Rfl:  .  aspirin EC 81 MG tablet, Take by mouth., Disp: , Rfl:  .  carbidopa-levodopa (SINEMET IR) 25-100 MG per tablet, Take by mouth., Disp: , Rfl:  .  carvedilol (COREG) 3.125 MG tablet, Take by mouth., Disp: , Rfl:  .  nitroGLYCERIN (NITROSTAT) 0.4 MG SL tablet, Place under the tongue., Disp: , Rfl:  .  rosuvastatin (CRESTOR) 20 MG tablet, Take by mouth., Disp: , Rfl:  .  sildenafil (VIAGRA) 100 MG tablet, Take by mouth., Disp: , Rfl:  .  ticagrelor (BRILINTA) 90 MG TABS tablet, Take by mouth., Disp: , Rfl:  .  traZODone (DESYREL) 50 MG tablet, Take by mouth., Disp: , Rfl:  .  valACYclovir (VALTREX) 500 MG tablet, 500 mg 2 (two) times daily. , Disp: , Rfl:   Past Medical History: Past Medical History  Diagnosis Date  . Coronary artery disease   . Hypertension   . Hyperlipidemia     Tobacco Use: History  Smoking status  . Never Smoker   Smokeless tobacco  . Not on file    Labs: Recent Review Flowsheet Data    There is no flowsheet data to display.       Exercise Target Goals:    Exercise Program Goal: Individual exercise prescription set with THRR, safety & activity barriers. Participant demonstrates ability to understand and report RPE using BORG scale, to self-measure pulse accurately, and to acknowledge the importance of the exercise prescription.  Exercise Prescription Goal: Starting with aerobic activity 30 plus minutes a day, 3 days per week for initial exercise prescription. Provide home exercise  prescription and guidelines that participant acknowledges understanding prior to discharge.  Activity Barriers & Risk Stratification:     Activity Barriers & Risk Stratification - 07/25/15 1530    Activity Barriers & Risk Stratification   Activity Barriers Back Problems;Balance Concerns;History of Falls   Risk Stratification High      6 Minute Walk:     6 Minute Walk      07/25/15 1413       6 Minute Walk   Phase Initial     Distance 1500 feet     Walk Time 6 minutes     Resting HR 58 bpm     Resting BP 138/84 mmHg     Max Ex. HR 95 bpm     Max Ex. BP 136/80 mmHg     RPE 11     Symptoms No        Initial Exercise Prescription:     Initial Exercise Prescription - 07/25/15 1400    Date of Initial Exercise Prescription   Date 07/25/15   Treadmill   MPH 2.5   Grade 0   Minutes 15   Bike   Level 0.4   Minutes 10   Recumbant Bike   Level 3   RPM 50   Watts 25   Minutes 10   NuStep   Level 3   Watts 40   Minutes 15   Arm Ergometer   Level 1  Watts 8   Minutes 10   Arm/Foot Ergometer   Level 4   Watts 15   Minutes 10   Cybex   Level 1   RPM 50   Minutes 10   Recumbant Elliptical   Level 1   RPM 40   Watts 10   Minutes 10   Elliptical   Level 1   Speed 3   Minutes 1   REL-XR   Level 2   Watts 35   Minutes 15   Prescription Details   Frequency (times per week) 3   Duration Progress to 30 minutes of continuous aerobic without signs/symptoms of physical distress   Intensity   THRR REST +  30   Ratings of Perceived Exertion 11-15   Progression Continue progressive overload as per policy without signs/symptoms or physical distress.   Resistance Training   Training Prescription Yes   Weight 2   Reps 10-15      Exercise Prescription Changes:     Exercise Prescription Changes      08/11/15 0900 08/11/15 1300         Exercise Review   Progression  Yes      Response to Exercise   Blood Pressure (Admit)  118/74 mmHg      Blood  Pressure (Exercise)  146/86 mmHg      Blood Pressure (Exit)  118/64 mmHg      Heart Rate (Admit)  62 bpm      Heart Rate (Exercise)  98 bpm      Heart Rate (Exit)  58 bpm      Rating of Perceived Exertion (Exercise)  12      Duration  Progress to 30 minutes of continuous aerobic without signs/symptoms of physical distress      Intensity  Rest + 30      Progression  Continue progressive overload as per policy without signs/symptoms or physical distress.      Resistance Training   Training Prescription  Yes      Weight  4      Reps  10-15      Interval Training   Interval Training  No      Treadmill   MPH  3      Grade  0      Minutes  15      Recumbant Bike   Level 4 4      RPM 65 65      Minutes 15 15      NuStep   Level  4      Watts  50      Minutes  15         Discharge Exercise Prescription (Final Exercise Prescription Changes):     Exercise Prescription Changes - 08/11/15 1300    Exercise Review   Progression Yes   Response to Exercise   Blood Pressure (Admit) 118/74 mmHg   Blood Pressure (Exercise) 146/86 mmHg   Blood Pressure (Exit) 118/64 mmHg   Heart Rate (Admit) 62 bpm   Heart Rate (Exercise) 98 bpm   Heart Rate (Exit) 58 bpm   Rating of Perceived Exertion (Exercise) 12   Duration Progress to 30 minutes of continuous aerobic without signs/symptoms of physical distress   Intensity Rest + 30   Progression Continue progressive overload as per policy without signs/symptoms or physical distress.   Resistance Training   Training Prescription Yes   Weight 4   Reps 10-15   Interval Training  Interval Training No   Treadmill   MPH 3   Grade 0   Minutes 15   Recumbant Bike   Level 4   RPM 65   Minutes 15   NuStep   Level 4   Watts 50   Minutes 15      Nutrition:  Target Goals: Understanding of nutrition guidelines, daily intake of sodium 1500mg , cholesterol 200mg , calories 30% from fat and 7% or less from saturated fats, daily to have 5 or more  servings of fruits and vegetables.  Biometrics:     Pre Biometrics - 07/25/15 1406    Pre Biometrics   Height 5' 10.4" (1.788 m)   Weight 203 lb 4.8 oz (92.216 kg)   Waist Circumference 37.75 inches   Hip Circumference 39.75 inches   Waist to Hip Ratio 0.95 %   BMI (Calculated) 28.9       Nutrition Therapy Plan and Nutrition Goals:     Nutrition Therapy & Goals - 07/25/15 1529    Nutrition Therapy   Drug/Food Interactions Statins/Certain Fruits   Intervention Plan   Intervention Using nutrition plan and personal goals to gain a healthy nutrition lifestyle. Add exercise as prescribed.      Nutrition Discharge: Rate Your Plate Scores:   Nutrition Goals Re-Evaluation:     Nutrition Goals Re-Evaluation      09/08/15 1107           Personal Goal #1 Re-Evaluation   Personal Goal #1 Mansel has been dealing with his Parkinson's disease and going to a neurologist who is going to send him for physical therapy. Has been out of Cardiac Rehab since 08/11/2015       Goal Progress Seen No          Psychosocial: Target Goals: Acknowledge presence or absence of depression, maximize coping skills, provide positive support system. Participant is able to verbalize types and ability to use techniques and skills needed for reducing stress and depression.  Initial Review & Psychosocial Screening:   Quality of Life Scores:     Quality of Life - 07/25/15 1549    Quality of Life Scores   Health/Function Pre 14.67 %   Socioeconomic Pre 24.21 %   Psych/Spiritual Pre 21.07 %   Family Pre 22.5 %   GLOBAL Pre 19 %      PHQ-9:     Recent Review Flowsheet Data    Depression screen [facility] 2/9 07/25/2015   Decreased Interest 2   Down, Depressed, Hopeless 0   PHQ - 2 Score 2   Altered sleeping 3   Tired, decreased energy 3   Change in appetite 0   Feeling bad or failure about yourself  0   Trouble concentrating 0   Moving slowly or fidgety/restless 3   Suicidal thoughts 0    PHQ-9 Score 11   Difficult doing work/chores Somewhat difficult       Psychosocial Evaluation and Intervention:   Psychosocial Re-Evaluation:     Psychosocial Re-Evaluation      09/08/15 1109           Psychosocial Re-Evaluation   Interventions Encouraged to attend Cardiac Rehabilitation for the exercise;Stress management education       Comments Stress due to "late stage Parkinson's disease Jamaul reports". [place] has been dealing with his Parkinson's disease and going to a neurologist who is going to send him for physical therapy. Has been out of Cardiac Rehab since 08/11/2015  Vocational Rehabilitation: Provide vocational rehab assistance to qualifying candidates.   Vocational Rehab Evaluation & Intervention:     Vocational Rehab - 07/25/15 1530    Initial Vocational Rehab Evaluation & Intervention   Assessment shows need for Vocational Rehabilitation No      Education: Education Goals: Education classes will be provided on a weekly basis, covering required topics. Participant will state understanding/return demonstration of topics presented.  Learning Barriers/Preferences:     Learning Barriers/Preferences - 07/25/15 1530    Learning Barriers/Preferences   Learning Barriers None   Learning Preferences None      Education Topics: General Nutrition Guidelines/Fats and Fiber: -Group instruction provided by verbal, written material, models and posters to present the general guidelines for heart healthy nutrition. Gives an explanation and review of dietary fats and fiber.   Controlling Sodium/Reading Food Labels: -Group verbal and written material supporting the discussion of sodium use in heart healthy nutrition. Review and explanation with models, verbal and written materials for utilization of the food label.   Exercise Physiology & Risk Factors: - Group verbal and written instruction with models to review the exercise physiology of the cardiovascular  system and associated critical values. Details cardiovascular disease risk factors and the goals associated with each risk factor.          Cardiac Rehab from 08/11/2015 in [facility] Cardiac Rehab   Date  08/11/15   Educator  SW   Instruction Review Code  2- meets goals/outcomes      Aerobic Exercise & Resistance Training: - Gives group verbal and written discussion on the health impact of inactivity. On the components of aerobic and resistive training programs and the benefits of this training and how to safely progress through these programs.   Flexibility, Balance, General Exercise Guidelines: - Provides group verbal and written instruction on the benefits of flexibility and balance training programs. Provides general exercise guidelines with specific guidelines to those with heart or lung disease. Demonstration and skill practice provided.   Stress Management: - Provides group verbal and written instruction about the health risks of elevated stress, cause of high stress, and healthy ways to reduce stress.   Depression: - Provides group verbal and written instruction on the correlation between heart/lung disease and depressed mood, treatment options, and the stigmas associated with seeking treatment.   Anatomy & Physiology of the Heart: - Group verbal and written instruction and models provide basic cardiac anatomy and physiology, with the coronary electrical and arterial systems. Review of: AMI, Angina, Valve disease, Heart Failure, Cardiac Arrhythmia, Pacemakers, and the ICD.   Cardiac Procedures: - Group verbal and written instruction and models to describe the testing methods done to diagnose heart disease. Reviews the outcomes of the test results. Describes the treatment choices: Medical Management, Angioplasty, or Coronary Bypass Surgery.   Cardiac Medications: - Group verbal and written instruction to review commonly prescribed medications for heart disease. Reviews the  medication, class of the drug, and side effects. Includes the steps to properly store meds and maintain the prescription regimen.   Go Sex-Intimacy & Heart Disease, Get SMART - Goal Setting: - Group verbal and written instruction through game format to discuss heart disease and the return to sexual intimacy. Provides group verbal and written material to discuss and apply goal setting through the application of the S.M.A.R.T. Method.   Other Matters of the Heart: - Provides group verbal, written materials and models to describe Heart Failure, Angina, Valve Disease, and Diabetes in the realm of heart  disease. Includes description of the disease process and treatment options available to the cardiac patient.   Exercise & Equipment Safety: - Individual verbal instruction and demonstration of equipment use and safety with use of the equipment.      Cardiac Rehab from 08/11/2015 in [facility] Cardiac Rehab   Date  07/25/15   Educator  C. Annakate Soulier,RN   Instruction Review Code  1- partially meets, needs review/practice      Infection Prevention: - Provides verbal and written material to individual with discussion of infection control including proper hand washing and proper equipment cleaning during exercise session.      Cardiac Rehab from 08/11/2015 in [facility] Cardiac Rehab   Date  07/25/15   Educator  C. Justen Fonda,RN   Instruction Review Code  2- meets goals/outcomes      Falls Prevention: - Provides verbal and written material to individual with discussion of falls prevention and safety.      Cardiac Rehab from 08/11/2015 in [facility] Cardiac Rehab   Date  07/25/15   Educator  C. Denzil Bristol,RN   Instruction Review Code  2- meets goals/outcomes      Diabetes: - Individual verbal and written instruction to review signs/symptoms of diabetes, desired ranges of glucose level fasting, after meals and with exercise. Advice that pre and post exercise glucose checks will be done for 3 sessions at entry of  program.    Knowledge Questionnaire Score:     Knowledge Questionnaire Score - 07/25/15 1427    Knowledge Questionnaire Score   Pre Score 22      Personal Goals and Risk Factors at Admission:     Personal Goals and Risk Factors at Admission - 07/25/15 1531    Personal Goals and Risk Factors on Admission    Weight Management No   Increase Aerobic Exercise and Physical Activity Yes   Intervention While in program, learn and follow the exercise prescription taught. Start at a low level workload and increase workload after able to maintain previous level for 30 minutes. Increase time before increasing intensity.   Quit Smoking No   Understand more about Heart/Pulmonary Disease. Yes   Intervention While in program utilize professionals for any questions, and attend the education sessions. Great websites to use are www.americanheart.org or www.lung.org for reliable information.   Diabetes No   Hypertension Yes   Goal Participant will see blood pressure controlled within the values of 140/70mm/Hg or within value directed by their physician.   Intervention Provide nutrition & aerobic exercise along with prescribed medications to achieve BP 140/90 or less.   Lipids Yes   Goal Cholesterol controlled with medications as prescribed, with individualized exercise RX and with personalized nutrition plan. Value goals: LDL < , HDL > . Participant states understanding of desired cholesterol values and following prescriptions.   Intervention Provide nutrition & aerobic exercise along with prescribed medications to achieve LDL 70mg , HDL >40mg .   Stress Yes   Goal To meet with psychosocial counselor for stress and relaxation information and guidance. To state understanding of performing relaxation techniques and or identifying personal stressors.   Intervention Provide education on types of stress, identifiying stressors, and ways to cope with stress. Provide demonstration and active practice of  relaxation techniques.   Personal Goal Other No      Personal Goals and Risk Factors Review:      Goals and Risk Factor Review      09/08/15 1108 09/14/15 1417         Increase Aerobic  Exercise and Physical Activity   Goals Progress/Improvement seen  No No      Comments [name] has been dealing with his Parkinson's disease and going to a neurologist who is going to send him for physical therapy. Has been out of Cardiac Rehab since 08/11/2015 Out a lot.      Hypertension   Goal --  Bp checked at his neurology appt lately.        Stress   Goal --  [name] has been dealing with his Parkinson's disease and going to a neurologist who is going to send him for physical therapy. Has been out of Cardiac Rehab since 08/11/2015          Personal Goals Discharge (Final Personal Goals and Risk Factors Review):      Goals and Risk Factor Review - 09/14/15 1417    Increase Aerobic Exercise and Physical Activity   Goals Progress/Improvement seen  No   Comments Out a lot.       Comments: Has been out due to balance problems with Parkinson's disease.

## 2015-09-14 NOTE — Addendum Note (Signed)
Addended by: Virgina OrganENTERKIN, Alie Hardgrove on: 09/14/2015 02:19 PM   Modules accepted: Orders

## 2015-09-27 ENCOUNTER — Emergency Department
Admission: EM | Admit: 2015-09-27 | Discharge: 2015-09-27 | Disposition: A | Discharge status: Home / Self Care | Payer: BLUE CROSS/BLUE SHIELD | Attending: Emergency Medicine | Admitting: Emergency Medicine

## 2015-09-27 ENCOUNTER — Emergency Department: Payer: BLUE CROSS/BLUE SHIELD

## 2015-09-27 DIAGNOSIS — R4182 Altered mental status, unspecified: Secondary | ICD-10-CM | POA: Insufficient documentation

## 2015-09-27 DIAGNOSIS — T50905A Adverse effect of unspecified drugs, medicaments and biological substances, initial encounter: Secondary | ICD-10-CM

## 2015-09-27 DIAGNOSIS — T424X5A Adverse effect of benzodiazepines, initial encounter: Secondary | ICD-10-CM | POA: Diagnosis not present

## 2015-09-27 DIAGNOSIS — Z7982 Long term (current) use of aspirin: Secondary | ICD-10-CM | POA: Diagnosis not present

## 2015-09-27 DIAGNOSIS — I10 Essential (primary) hypertension: Secondary | ICD-10-CM | POA: Insufficient documentation

## 2015-09-27 DIAGNOSIS — F1210 Cannabis abuse, uncomplicated: Secondary | ICD-10-CM | POA: Insufficient documentation

## 2015-09-27 DIAGNOSIS — Z79899 Other long term (current) drug therapy: Secondary | ICD-10-CM | POA: Diagnosis not present

## 2015-09-27 DIAGNOSIS — F1310 Sedative, hypnotic or anxiolytic abuse, uncomplicated: Secondary | ICD-10-CM | POA: Diagnosis not present

## 2015-09-27 LAB — CBC
HCT: 46.8 % (ref 40.0–52.0)
HEMOGLOBIN: 16.2 g/dL (ref 13.0–18.0)
MCH: 32.3 pg (ref 26.0–34.0)
MCHC: 34.7 g/dL (ref 32.0–36.0)
MCV: 93.2 fL (ref 80.0–100.0)
Platelets: 144 10*3/uL — ABNORMAL LOW (ref 150–440)
RBC: 5.02 MIL/uL (ref 4.40–5.90)
RDW: 13.6 % (ref 11.5–14.5)
WBC: 4.5 10*3/uL (ref 3.8–10.6)

## 2015-09-27 LAB — ETHANOL: Alcohol, Ethyl (B): <5 mg/dL (ref <5)

## 2015-09-27 LAB — COMPREHENSIVE METABOLIC PANEL
ALK PHOS: 95 U/L (ref 38–126)
ALT: 14 U/L — ABNORMAL LOW (ref 17–63)
ANION GAP: 9 (ref 5–15)
AST: 27 U/L (ref 15–41)
Albumin: 5.3 g/dL — ABNORMAL HIGH (ref 3.5–5.0)
BUN: 14 mg/dL (ref 6–20)
CALCIUM: 9.3 mg/dL (ref 8.9–10.3)
CO2: 27 mmol/L (ref 22–32)
Chloride: 102 mmol/L (ref 101–111)
Creatinine, Ser: 0.93 mg/dL (ref 0.61–1.24)
Glucose, Bld: 143 mg/dL — ABNORMAL HIGH (ref 65–99)
Potassium: 3.8 mmol/L (ref 3.5–5.1)
Sodium: 138 mmol/L (ref 135–145)
TOTAL PROTEIN: 8.5 g/dL — AB (ref 6.5–8.1)
Total Bilirubin: 1.4 mg/dL — ABNORMAL HIGH (ref 0.3–1.2)

## 2015-09-27 LAB — URINE DRUG SCREEN, QUALITATIVE (ARMC ONLY)
Amphetamines, Ur Screen: NOT DETECTED
BARBITURATES, UR SCREEN: NOT DETECTED
BENZODIAZEPINE, UR SCRN: POSITIVE — AB
CANNABINOID 50 NG, UR ~~LOC~~: POSITIVE — AB
COCAINE METABOLITE, UR ~~LOC~~: NOT DETECTED
MDMA (Ecstasy)Ur Screen: NOT DETECTED
METHADONE SCREEN, URINE: NOT DETECTED
Opiate, Ur Screen: NOT DETECTED
Phencyclidine (PCP) Ur S: NOT DETECTED
TRICYCLIC, UR SCREEN: NOT DETECTED

## 2015-09-27 LAB — PROTIME-INR
INR: 1.03
Prothrombin Time: 13.7 seconds (ref 11.4–15.0)

## 2015-09-27 LAB — URINALYSIS COMPLETE WITH MICROSCOPIC (ARMC ONLY)
BILIRUBIN URINE: NEGATIVE
Bacteria, UA: NONE SEEN
GLUCOSE, UA: NEGATIVE mg/dL
HGB URINE DIPSTICK: NEGATIVE
KETONES UR: NEGATIVE mg/dL
LEUKOCYTES UA: NEGATIVE
NITRITE: NEGATIVE
Protein, ur: 100 mg/dL — AB
SPECIFIC GRAVITY, URINE: 1.012 (ref 1.005–1.030)
Squamous Epithelial / LPF: NONE SEEN
pH: 7 (ref 5.0–8.0)

## 2015-09-27 LAB — DIFFERENTIAL
BASOS ABS: 0 10*3/uL (ref 0–0.1)
Basophils Relative: 1 %
EOS PCT: 2 %
Eosinophils Absolute: 0.1 10*3/uL (ref 0–0.7)
LYMPHS PCT: 19 %
Lymphs Abs: 0.9 10*3/uL — ABNORMAL LOW (ref 1.0–3.6)
MONO ABS: 0.4 10*3/uL (ref 0.2–1.0)
MONOS PCT: 8 %
NEUTROS PCT: 70 %
Neutro Abs: 3.1 10*3/uL (ref 1.4–6.5)

## 2015-09-27 LAB — SALICYLATE LEVEL: Salicylate Lvl: <4 mg/dL (ref 2.8–30.0)

## 2015-09-27 LAB — ACETAMINOPHEN LEVEL: Acetaminophen (Tylenol), Serum: <10 ug/mL — ABNORMAL LOW (ref 10–30)

## 2015-09-27 LAB — GLUCOSE, CAPILLARY: Glucose-Capillary: 134 mg/dL — ABNORMAL HIGH (ref 65–99)

## 2015-09-27 LAB — APTT: APTT: 28 s (ref 24–36)

## 2015-09-27 LAB — TROPONIN I

## 2015-09-27 NOTE — ED Notes (Signed)
Pt arrived via EMS c/o altered mental status, headache, fatigue, tired, and lethargic. Co-worker reports pt appearing normal this morning, and around 4PM today began acting "different". Negative stroke scale according to EMS. CBG 139, BP 190/100.

## 2015-09-27 NOTE — Progress Notes (Signed)
   09/27/15 1900  Clinical Encounter Type  Visited With Patient and family together  Visit Type Code  Referral From Nurse  Consult/Referral To Chaplain  Responded to Code Stroke in ED.  Pt was responsive, family member present.  Provided pastoral presence and support to patient and family member.  Asbury Automotive GroupChaplain Ravonda Brecheen-pager [phone]

## 2015-09-27 NOTE — ED Provider Notes (Signed)
[facility] Emergency Department Provider Note  ____________________________________________  Time seen: Seen upon arrival to the emergency department  I have reviewed the triage vital signs and the nursing notes.   HISTORY  Chief Complaint Altered Mental Status    HPI Sean Barajas is a 59 y.o. male history of Parkinson's disease was presenting with a sudden onset of weakness that started at 4 PM. The patient says that he feels like he all the energy "sucked out of him." He says that he has been having difficulty sleeping for the past several evenings. Despite this he has still been taking his Xanax.He denies any pain at this time. He says he took Viagra recently but otherwise has not taken any unusual medications or any unusual amount of medications. Denies any nausea vomiting or diarrhea. No chest pain or shortness of breath.   Past Medical History  Diagnosis Date  . Coronary artery disease   . Hypertension   . Hyperlipidemia     There are no active problems to display for this patient.   Past Surgical History  Procedure Laterality Date  . Cardiac catheterization    . Coronary angioplasty    . Coronary artery bypass graft      Current Outpatient Rx  Name  Route  Sig  Dispense  Refill  . acetaminophen (TYLENOL) 500 MG tablet   Oral   Take 1,000 mg by mouth every 6 (six) hours as needed for mild pain.         Marland Kitchen albuterol (PROVENTIL HFA;VENTOLIN HFA) 108 (90 BASE) MCG/ACT inhaler   Inhalation   Inhale 2 puffs into the lungs every 6 (six) hours as needed for wheezing or shortness of breath.         . ALPRAZolam (XANAX) 0.5 MG tablet   Oral   Take 0.5 mg by mouth 2 (two) times daily as needed for anxiety.         . ARTIFICIAL TEAR OP   Ophthalmic   Apply 1 drop to eye as needed (for dry eyes).         Marland Kitchen aspirin EC 81 MG tablet   Oral   Take 81 mg by mouth daily.         . carbidopa-levodopa (SINEMET CR) 50-200 MG tablet    Oral   Take 1 tablet by mouth 2 (two) times daily.          . carbidopa-levodopa (SINEMET IR) 25-100 MG tablet   Oral   Take 1.5 tablets by mouth daily.         . carvedilol (COREG) 3.125 MG tablet   Oral   Take 3.125 mg by mouth 2 (two) times daily.         . nitroGLYCERIN (NITROSTAT) 0.4 MG SL tablet   Sublingual   Place 0.4 mg under the tongue every 5 (five) minutes as needed for chest pain.         . rasagiline (AZILECT) 1 MG TABS tablet   Oral   Take 1 mg by mouth daily.         . rosuvastatin (CRESTOR) 20 MG tablet   Oral   Take 20 mg by mouth at bedtime.         . selegiline (ELDEPRYL) 5 MG capsule   Oral   Take 5 mg by mouth 2 (two) times daily with a meal.         . sildenafil (VIAGRA) 100 MG tablet   Oral  Take 100 mg by mouth daily as needed for erectile dysfunction.         . ticagrelor (BRILINTA) 90 MG TABS tablet   Oral   Take 90 mg by mouth every 12 (twelve) hours.         . traZODone (DESYREL) 50 MG tablet   Oral   Take 50-75 mg by mouth at bedtime.         . valACYclovir (VALTREX) 500 MG tablet   Oral   Take 500 mg by mouth 2 (two) times daily.           Allergies Iodinated diagnostic agents and Sulfa antibiotics  History reviewed. No pertinent family history.  Social History Social History  Substance Use Topics  . Smoking status: Never Smoker   . Smokeless tobacco: None  . Alcohol Use: No    Review of Systems Constitutional: No fever/chills Eyes: No visual changes. ENT: No sore throat. Cardiovascular: Denies chest pain. Respiratory: Denies shortness of breath. Gastrointestinal: No abdominal pain.  No nausea, no vomiting.  No diarrhea.  No constipation. Genitourinary: Negative for dysuria. Musculoskeletal: Negative for back pain. Skin: Negative for rash. Neurological: Negative for headaches, focal weakness or numbness.  10-point ROS otherwise  negative.  ____________________________________________   PHYSICAL EXAM:  VITAL SIGNS: ED Triage Vitals  Enc Vitals Group     BP 09/27/15 1831 186/102 mmHg     Pulse Rate 09/27/15 1831 67     Resp 09/27/15 1831 18     Temp 09/27/15 1831 97.6 F (36.4 C)     Temp Source 09/27/15 1831 Oral     SpO2 09/27/15 1831 98 %     Weight 09/27/15 1831 213 lb (96.616 kg)     Height 09/27/15 1831 5\' 11"  (1.803 m)     Head Cir --      Peak Flow --      Pain Score 09/27/15 1833 3     Pain Loc --      Pain Edu? --      Excl. in GC? --     Constitutional: Alert and oriented. Appears drowsy but responsive to questioning.  Eyes: Conjunctivae are normal. PERRL. EOMI. Head: Atraumatic. Nose: No congestion/rhinnorhea. Mouth/Throat: Mucous membranes are moist.  Oropharynx non-erythematous. Neck: No stridor.   Cardiovascular: Normal rate, regular rhythm. Grossly normal heart sounds.  Good peripheral circulation. Respiratory: Normal respiratory effort.  No retractions. Lungs CTAB. Gastrointestinal: Soft and nontender. No distention. No abdominal bruits. No CVA tenderness. Musculoskeletal: No lower extremity tenderness nor edema.  No joint effusions. Neurologic:  Speech is slow but understandable. Appears to have mild left facial asymmetry with a possible mild left facial droop. However, when he smiles broadly his smile is equal. Skin:  Skin is warm, dry and intact. No rash noted. Psychiatric: Mood and affect are normal. Speech and behavior are normal.  NIH Stroke Scale   Person Administering Scale: Arelia LongestSchaevitz,  Thelda Gagan M  Administer stroke scale items in the order listed. Record performance in each category after each subscale exam. Do not go back and change scores. Follow directions provided for each exam technique. Scores should reflect what the patient does, not what the clinician thinks the patient can do. The clinician should record answers while administering the exam and work quickly. Except  where indicated, the patient should not be coached (i.e., repeated requests to patient to make a special effort).   1a  Level of consciousness: 0=alert; keenly responsive  1b. LOC questions:  0=Performs both  tasks correctly  1c. LOC commands: 0=Performs both tasks correctly  2.  Best Gaze: 0=normal  3.  Visual: 0=No visual loss  4. Facial Palsy: 1=Minor paralysis (flattened nasolabial fold, asymmetric on smiling)  5a.  Motor left arm: 0=No drift, limb holds 90 (or 45) degrees for full 10 seconds  5b.  Motor right arm: 0=No drift, limb holds 90 (or 45) degrees for full 10 seconds  6a. motor left leg: 0=No drift, limb holds 90 (or 45) degrees for full 10 seconds  6b  Motor right leg:  0=No drift, limb holds 90 (or 45) degrees for full 10 seconds  7. Limb Ataxia: 0=Absent  8.  Sensory: 0=Normal; no sensory loss  9. Best Language:  0=No aphasia, normal  10. Dysarthria: 0=Normal  11. Extinction and Inattention: 0=No abnormality  12. Distal motor function: 0=Normal   Total:   1   ____________________________________________   LABS (all labs ordered are listed, but only abnormal results are displayed)  Labs Reviewed  CBC - Abnormal; Notable for the following:    Platelets 144 (*)    All other components within normal limits  DIFFERENTIAL - Abnormal; Notable for the following:    Lymphs Abs 0.9 (*)    All other components within normal limits  COMPREHENSIVE METABOLIC PANEL - Abnormal; Notable for the following:    Glucose, Bld 143 (*)    Total Protein 8.5 (*)    Albumin 5.3 (*)    ALT 14 (*)    Total Bilirubin 1.4 (*)    All other components within normal limits  URINE DRUG SCREEN, QUALITATIVE ([facility] ONLY) - Abnormal; Notable for the following:    Cannabinoid 50 Ng, Ur  POSITIVE (*)    Benzodiazepine, Ur Scrn POSITIVE (*)    All other components within normal limits  URINALYSIS COMPLETEWITH MICROSCOPIC ([facility] ONLY) - Abnormal; Notable for the following:    Color, Urine YELLOW  (*)    APPearance CLEAR (*)    Protein, ur 100 (*)    All other components within normal limits  GLUCOSE, CAPILLARY - Abnormal; Notable for the following:    Glucose-Capillary 134 (*)    All other components within normal limits  ACETAMINOPHEN LEVEL - Abnormal; Notable for the following:    Acetaminophen (Tylenol), Serum <10 (*)    All other components within normal limits  ETHANOL  PROTIME-INR  APTT  TROPONIN I  SALICYLATE LEVEL   ____________________________________________  EKG  EMS EKG  ED ECG REPORT I, Arelia Longest, the attending physician, personally viewed and interpreted this ECG.   Date: 09/27/2015  EKG Time: 1802  Rate: 68  Rhythm: normal sinus rhythm  Axis: Normal axis  Intervals:none  ST&T Change: T-wave inversion in aVL. No ST elevation or depressions. No significant change from 07/10/2009. ____________________________________________  RADIOLOGY  Acute cardiopulmonary disease on the chest x-ray. No acute intracranial abnormality on the CT of the brain. ____________________________________________   PROCEDURES    ____________________________________________   INITIAL IMPRESSION / ASSESSMENT AND PLAN / ED COURSE  Pertinent labs & imaging results that were available during my care of the patient were reviewed by me and considered in my medical decision making (see chart for details).  ----------------------------------------- 8:39 PM on 09/27/2015 -----------------------------------------  I discussed case with Dr. Cyndie Chime of the specialist on-call neurology service who believes that the patient's presentation could be related to his Azilect medication. I then discussed the case with Dr. Wandra Arthurs and of the neurology service who recommends cutting the patient's  dose of Azilect 0.5 mg. Upon reevaluating the patient the patient says that he feels back to normal. His speech as well his affect have now returned normal. He still has no focal weakness  or facial asymmetry with smiling. I discussed with him the findings of his labs as well as imaging workup as well as the conversations I had with the neurologist. He says he has a pill cutter at home and will be able to cut his Azilect in half so that he only takes 0.5 mg per day. The patient also knows that he will follow-up with his neurologist Dr. Malvin Johns and needs call schedule an appointment. He is understanding of the plan and willing to comply. ____________________________________________   FINAL CLINICAL IMPRESSION(S) / ED DIAGNOSES  Acute medication side effect. Acute altered mental status.    Myrna Blazer, MD 09/27/15 [phone]

## 2015-09-28 ENCOUNTER — Encounter: Payer: Self-pay | Admitting: Occupational Therapy

## 2015-09-28 ENCOUNTER — Ambulatory Visit: Payer: BLUE CROSS/BLUE SHIELD | Attending: Neurology | Admitting: Occupational Therapy

## 2015-09-28 DIAGNOSIS — G20 Parkinson's disease: Secondary | ICD-10-CM

## 2015-09-28 DIAGNOSIS — R2681 Unsteadiness on feet: Secondary | ICD-10-CM | POA: Diagnosis present

## 2015-09-28 DIAGNOSIS — R279 Unspecified lack of coordination: Secondary | ICD-10-CM | POA: Diagnosis present

## 2015-09-28 DIAGNOSIS — M6281 Muscle weakness (generalized): Secondary | ICD-10-CM | POA: Diagnosis present

## 2015-09-28 DIAGNOSIS — R262 Difficulty in walking, not elsewhere classified: Secondary | ICD-10-CM | POA: Insufficient documentation

## 2015-09-28 NOTE — Therapy (Signed)
[facility] [address] [place], [place], [address] Phone: [phone]   Fax:  [phone]  Occupational Therapy Evaluation  Patient Details  Name: Sean Barajas MRN: [id number] Date of Birth: 10/08/1956 No Data Recorded  Encounter Date: 09/28/2015      OT End of Session - 09/28/15 1626    Visit Number 1   Number of Visits 17   Date for OT Re-Evaluation 11/07/15   OT Start Time 1100   OT Stop Time 1202   OT Time Calculation (min) 62 min   Activity Tolerance Patient tolerated treatment well   Behavior During Therapy [facility] for tasks assessed/performed      Past Medical History  Diagnosis Date  . Coronary artery disease   . Hypertension   . Hyperlipidemia     Past Surgical History  Procedure Laterality Date  . Cardiac catheterization    . Coronary angioplasty    . Coronary artery bypass graft      There were no vitals filed for this visit.  Visit Diagnosis:  Parkinson's disease (HCC) - Plan: Ot plan of care cert/re-cert  Muscle weakness (generalized) - Plan: Ot plan of care cert/re-cert  Lack of coordination - Plan: Ot plan of care cert/re-cert  Difficulty walking - Plan: Ot plan of care cert/re-cert  Unsteady gait - Plan: Ot plan of care cert/re-cert      Subjective Assessment - 09/28/15 1109    Patient Stated Goals Patient reports he wants to walk without stumbling, better balance.     Currently in Pain? Yes   Pain Score 2    Pain Location Head   Pain Orientation Right   Pain Type Acute pain   Pain Onset Yesterday   Pain Frequency Intermittent   Multiple Pain Sites No           [facility] OT Assessment - 09/28/15 1111    Assessment   Diagnosis Parkinson's disease   Onset Date 04/02/13   Prior Therapy cardiac rehab in 2016, stent placed in august   Balance Screen   Has the patient fallen in the past 6 months Yes   How many times? 12   Has the patient had a decrease in activity level  because of a fear of falling?  No   Is the patient reluctant to leave their home because of a fear of falling?  No   Home  Environment   Family/patient expects to be discharged to: Private residence   Living Arrangements Parent   Available Help at Discharge Family   Type of Home House   Home Access Stairs   Home Layout One level   Bathroom Shower/Tub Tub/Shower unit;Curtain   Bathroom Toilet Standard   Home Equipment [place] Parkane - single point   Additional Comments Pt has difficulty with picking up items from various heights, squatting to perform tasks at home and at work, tends to lose balance posterior, difficulty with walking down uneven surfaces such as small hill and tends to fall forwards.     Lives With Family  mom   Prior Function   Level of Independence Independent   Vocation Full time employment   ADL   Eating/Feeding Modified independent  difficulty swallowing, drinks at room temp   Grooming Modified independent   Lower Body Bathing Increased time   Upper Body Dressing Needs assist for fasteners;Increased time   Lower Body Dressing Increased time;Needs assist for fasteners  decreased balance   Toilet Tranfer Modified independent  Toileting - Clothing Manipulation Increased time   Tub/Shower Transfer Modified independent   IADL   Prior Level of Function Shopping independent   Shopping Takes care of all shopping needs independently   Prior Level of Function Light Housekeeping independent   Light Housekeeping Performs light daily tasks such as dishwashing, bed making   Meal Prep Able to complete simple cold meal and snack prep   Community Mobility Drives own vehicle   Medication Management Is responsible for taking medication in correct dosages at correct time   Prior Level of Function Financial Management independent   Development worker, community financial matters independently (budgets, writes checks, pays rent, bills goes to bank), collects and keeps track of income    Mobility   Mobility Status Independent;History of falls   Written Expression   Dominant Hand Right   Handwriting Not legible   Cognition   Overall Cognitive Status Within Functional Limits for tasks assessed   Memory Appears intact  increased difficulty with short term memory   Sensation   Light Touch Appears Intact  increased numbness bilateral hands but worse on right   Coordination   Gross Motor Movements are Fluid and Coordinated No   Fine Motor Movements are Fluid and Coordinated No   9 Hole Peg Test Right;Left   Right 9 Hole Peg Test 27 secs   Left 9 Hole Peg Test 28 secs   ROM / Strength   AROM / PROM / Strength AROM;Strength   AROM   Overall AROM  Within functional limits for tasks performed   Strength   Overall Strength Deficits   Overall Strength Comments 4/5 overall UE   Hand Function   Right Hand Grip (lbs) 61   Right Hand Lateral Pinch 21 lbs   Right Hand 3 Point Pinch 17 lbs   Left Hand Grip (lbs) 63   Left Hand Lateral Pinch 21 lbs   Left 3 point pinch 17 lbs   Comment 2 point right 13#, left 15#                         OT Education - 09/28/15 1634    Education provided Yes   Education Details LSVT BIG program, role of therapist, answered questions regarding therapy.   Person(s) Educated Patient   Methods Explanation   Comprehension Verbalized understanding             OT Long Term Goals - 09/28/15 1634    OT LONG TERM GOAL #1   Title Patient will improve gait speed and endurance and be able to walk 1500 feet in 6 minutes to negotiate around the home and community safely in 4 weeks.   Baseline 1315 feet at evaluation   Time 4   Period Weeks   Status New   OT LONG TERM GOAL #2   Title Patient will complete HEP for maximal daily exercises with modified independence in 4 weeks   Baseline no current exercise program for parkinson's   Time 4   Period Weeks   Status New   OT LONG TERM GOAL #3   Title Patient will transfer from  sit to stand without the use of arms safely and independently from a variety of chairs/surfaces in 4 weeks.    Baseline difficulty from low surfaces   Time 4   Period Weeks   Status New   OT LONG TERM GOAL #4   Title Patient will decrease frequency of freezing episodes with score  of 8 or less on Freezing of Gait Questionnaire    Baseline score of 9 at evaluation.    OT LONG TERM GOAL #5   Title Patient will be modified independent with home and work tasks including squatting to pick up items from the floor.   Time 4   Period Weeks   Status New               Plan - 10-13-2015 1624    Clinical Impression Statement Patient is a 59 yo male diagnosed with Parkinson's disease and was referred by his physician for LSVT BIG program. Patient presents with repeated falls, decreased step length with gait patterns, decreased reciprocal arm swing, decreased balance, occasional hesitation and freezing of gait with turns, decreased coordination, and muscle strength which affect his ability to perform daily tasks. The patient is judged to be an excellent candidate for the LSVT BIG program. He would benefit from and was referred for the LSVT BIG program which is an intensive program designed specifically for Parkinson's patients with a focus on increasing amplitude and speed of movements, improving self-care and daily tasks and providing patients with daily exercises to improve overall function. It is recommended that the patient receive the LSVT BIG program which is comprised of 16 intensive sessions (4 times per week for 4 weeks, one hour sessions). Prognosis for improvement is good based on his motivation and strong family support. LSVT BIG has been documented in the literature as efficacious for individuals with Parkinson's disease.    Pt will benefit from skilled therapeutic intervention in order to improve on the following deficits (Retired) Abnormal gait;Decreased knowledge of use of DME;Decreased  strength;Decreased balance;Decreased mobility;Difficulty walking;Decreased coordination   Rehab Potential Good   OT Frequency 4x / week   OT Duration 4 weeks   OT Treatment/Interventions Self-care/ADL training;Therapeutic exercise;Functional Mobility Training;Patient/family education;Neuromuscular education;DME and/or AE instruction;Gait Training;Stair Training   Plan Patient to be seen for eval plus 16 tx sessions for a total of 17 sessions.     Recommended Other Services SLP for LSVT LOUD   Consulted and Agree with Plan of Care Patient          G-Codes - 2015-10-13 1647    Functional Assessment Tool Used 6 minute walk, 5 times sit to stand, BERG balance, clinical judgment, freezing of gait questionairre   Functional Limitation Mobility: Walking and moving around   Mobility: Walking and Moving Around Current Status (Z6109) At least 20 percent but less than 40 percent impaired, limited or restricted   Mobility: Walking and Moving Around Goal Status [phone]) At least 1 percent but less than 20 percent impaired, limited or restricted      Problem List There are no active problems to display for this patient.  [name], OTR/L, CLT Gerard Bonus 10/13/15, 4:50 PM  [facility] MAIN [facility] SERVICES [address] [place], [place], [address] Phone: [phone]   Fax:  [phone]  Name: Sean Barajas MRN: [id number] Date of Birth: [birth year]

## 2015-09-29 ENCOUNTER — Ambulatory Visit: Payer: BLUE CROSS/BLUE SHIELD | Attending: Neurology

## 2015-09-29 DIAGNOSIS — G4733 Obstructive sleep apnea (adult) (pediatric): Secondary | ICD-10-CM | POA: Insufficient documentation

## 2015-10-04 ENCOUNTER — Encounter: Payer: BLUE CROSS/BLUE SHIELD | Attending: Cardiology

## 2015-10-04 DIAGNOSIS — Z955 Presence of coronary angioplasty implant and graft: Secondary | ICD-10-CM | POA: Insufficient documentation

## 2015-10-10 ENCOUNTER — Ambulatory Visit: Payer: BLUE CROSS/BLUE SHIELD | Attending: Neurology | Admitting: Occupational Therapy

## 2015-10-10 DIAGNOSIS — M6281 Muscle weakness (generalized): Secondary | ICD-10-CM

## 2015-10-10 DIAGNOSIS — R262 Difficulty in walking, not elsewhere classified: Secondary | ICD-10-CM | POA: Diagnosis present

## 2015-10-10 DIAGNOSIS — R279 Unspecified lack of coordination: Secondary | ICD-10-CM

## 2015-10-10 DIAGNOSIS — G20 Parkinson's disease: Secondary | ICD-10-CM | POA: Diagnosis present

## 2015-10-10 DIAGNOSIS — R2681 Unsteadiness on feet: Secondary | ICD-10-CM | POA: Diagnosis present

## 2015-10-11 ENCOUNTER — Ambulatory Visit: Payer: BLUE CROSS/BLUE SHIELD | Admitting: Occupational Therapy

## 2015-10-11 ENCOUNTER — Telehealth: Payer: Self-pay | Admitting: *Deleted

## 2015-10-11 DIAGNOSIS — M6281 Muscle weakness (generalized): Secondary | ICD-10-CM

## 2015-10-11 DIAGNOSIS — R2681 Unsteadiness on feet: Secondary | ICD-10-CM

## 2015-10-11 DIAGNOSIS — G20 Parkinson's disease: Secondary | ICD-10-CM | POA: Diagnosis not present

## 2015-10-11 DIAGNOSIS — R262 Difficulty in walking, not elsewhere classified: Secondary | ICD-10-CM

## 2015-10-11 DIAGNOSIS — R279 Unspecified lack of coordination: Secondary | ICD-10-CM

## 2015-10-11 NOTE — Therapy (Signed)
[place] [facility] MAIN [facility] SERVICES [address] [place], [place], [address] Phone: [phone]   Fax:  [phone]  Occupational Therapy Treatment  Patient Details  Name: Sean Barajas MRN: [id number] Date of Birth: [birth year] No Data Recorded  Encounter Date: 10/10/2015      OT End of Session - 10/10/15 2035    Visit Number 2   Number of Visits 17   Date for OT Re-Evaluation 11/07/15   OT Start Time 1100   OT Stop Time 1159   OT Time Calculation (min) 59 min   Activity Tolerance Patient tolerated treatment well   Behavior During Therapy [facility] for tasks assessed/performed      Past Medical History  Diagnosis Date  . Coronary artery disease   . Hypertension   . Hyperlipidemia     Past Surgical History  Procedure Laterality Date  . Cardiac catheterization    . Coronary angioplasty    . Coronary artery bypass graft      There were no vitals filed for this visit.  Visit Diagnosis:  Parkinson's disease (HCC)  Muscle weakness (generalized)  Lack of coordination  Difficulty walking  Unsteady gait      Subjective Assessment - 10/10/15 2029    Subjective  Patient reports he had a reaction last week from some of his medication and had to go to the ER.  He feels better now than he did at his initial evaluation.     Patient Stated Goals Patient reports he wants to walk without stumbling, better balance.     Currently in Pain? No/denies   Pain Score 0-No pain   Multiple Pain Sites No      Patient seen for initial instruction of LSVT BIG exercises: LSVT Daily Session Maximal Daily Exercises: Sustained movements are designed to rescale the amplitude of movement output for generalization to daily functional activities. Performed as follows for 1 set of 10 repetitions each: Multi directional sustained movements- 1) Floor to ceiling, 2) Side to side. Multi directional Repetitive movements performed in standing and are designed to  provide retraining effort needed for sustained muscle activation in tasks Performed as follows: 3) Step and reach forward, 4) Step and Reach Backwards, 5) Step and reach sideways, 6) Rock and reach forward/backward, 7) Rock and reach sideways. Sit to stand from mat table on lowest setting with cues for weight shift, technique and CGA for 5 reps for 2 sets. Patient seen for functional mobility tasks this date with emphasis on gait speed, length of steps with short distance ambulation. Patient requires cues for BIG movements and cadenceAll standing exercises performed this date with CGA, verbal and tactile cues as needed.      Other Exercises 2 Functional mobility utilizing LSVT BIG techniques for 2 trials of 250 feet, cues for increasing amplitude of steps as well as reciprocal arm swing.                        OT Treatments/Exercises (OP) - 10/11/15 0001    Transfers   Comments --                OT Education - 10/10/15 2031    Education provided Yes   Education Details LSVT BIG, maximal daily exercises   Person(s) Educated Patient   Methods Explanation;Demonstration;Verbal cues;Tactile cues   Comprehension Verbal cues required;Returned demonstration;Verbalized understanding;Tactile cues required             OT Long Term  Goals - 09/28/15 1634    OT LONG TERM GOAL #1   Title Patient will improve gait speed and endurance and be able to walk 1500 feet in 6 minutes to negotiate around the home and community safely in 4 weeks.   Baseline 1315 feet at evaluation   Time 4   Period Weeks   Status New   OT LONG TERM GOAL #2   Title Patient will complete HEP for maximal daily exercises with modified independence in 4 weeks   Baseline no current exercise program for parkinson's   Time 4   Period Weeks   Status New   OT LONG TERM GOAL #3   Title Patient will transfer from sit to stand without the use of arms safely and independently from a variety of  chairs/surfaces in 4 weeks.    Baseline difficulty from low surfaces   Time 4   Period Weeks   Status New   OT LONG TERM GOAL #4   Title Patient will decrease frequency of freezing episodes with score of 8 or less on Freezing of Gait Questionnaire    Baseline score of 9 at evaluation.    OT LONG TERM GOAL #5   Title Patient will be modified independent with home and work tasks including squatting to pick up items from the floor.   Time 4   Period Weeks   Status New               Plan - 10/10/15 2036    Clinical Impression Statement Patient seen for initial instruction on LSVT BIG maximal daily exercises this session, required moderate cues and CGA for all exercises performed in standing for balance.  Patient will benefit from focus on balance and amplitude of gait in the next sessions to improve these areas for work tasks.   Pt will benefit from skilled therapeutic intervention in order to improve on the following deficits (Retired) Abnormal gait;Decreased knowledge of use of DME;Decreased strength;Decreased balance;Decreased mobility;Difficulty walking;Decreased coordination   Rehab Potential Good   OT Frequency 4x / week   OT Duration 4 weeks   OT Treatment/Interventions Self-care/ADL training;Therapeutic exercise;Functional Mobility Training;Patient/family education;Neuromuscular education;DME and/or AE instruction;Gait Training;Stair Training   Consulted and Agree with Plan of Care Patient        Problem List There are no active problems to display for this patient.  Kerrie Buffalomy T Miranda Frese, OTR/L, CLT Elver Stadler 10/11/2015, 8:48 PM  [place] [facility] [address] [place], [place], [address] Phone: [phone]   Fax:  [phone]  Name: Sean Barajas MRN: [id number] Date of Birth: 05/16/1956

## 2015-10-11 NOTE — Telephone Encounter (Signed)
Called to check on status to return to program. Mr Cleophas DunkerWhitfield plans to return to class this Thursday 11/10

## 2015-10-12 ENCOUNTER — Encounter: Payer: Self-pay | Admitting: *Deleted

## 2015-10-12 ENCOUNTER — Ambulatory Visit: Payer: BLUE CROSS/BLUE SHIELD | Admitting: Occupational Therapy

## 2015-10-12 DIAGNOSIS — R279 Unspecified lack of coordination: Secondary | ICD-10-CM

## 2015-10-12 DIAGNOSIS — G20 Parkinson's disease: Secondary | ICD-10-CM

## 2015-10-12 DIAGNOSIS — Z955 Presence of coronary angioplasty implant and graft: Secondary | ICD-10-CM

## 2015-10-12 DIAGNOSIS — R2681 Unsteadiness on feet: Secondary | ICD-10-CM

## 2015-10-12 DIAGNOSIS — M6281 Muscle weakness (generalized): Secondary | ICD-10-CM

## 2015-10-12 DIAGNOSIS — R262 Difficulty in walking, not elsewhere classified: Secondary | ICD-10-CM

## 2015-10-12 NOTE — Progress Notes (Signed)
Cardiac Individual Treatment Plan  Patient Details  Name: Sean Barajas MRN: [id number] Date of Birth: [birth year] Referring Provider:  Marcina Millard, MD  Initial Encounter Date:    Visit Diagnosis: Stented coronary artery  Patient's Home Medications on Admission:  Current outpatient prescriptions:  .  acetaminophen (TYLENOL) 500 MG tablet, Take 1,000 mg by mouth every 6 (six) hours as needed for mild pain., Disp: , Rfl:  .  albuterol (PROVENTIL HFA;VENTOLIN HFA) 108 (90 BASE) MCG/ACT inhaler, Inhale 2 puffs into the lungs every 6 (six) hours as needed for wheezing or shortness of breath., Disp: , Rfl:  .  ALPRAZolam (XANAX) 0.5 MG tablet, Take 0.5 mg by mouth 2 (two) times daily as needed for anxiety., Disp: , Rfl:  .  ARTIFICIAL TEAR OP, Apply 1 drop to eye as needed (for dry eyes)., Disp: , Rfl:  .  aspirin EC 81 MG tablet, Take 81 mg by mouth daily., Disp: , Rfl:  .  carbidopa-levodopa (SINEMET CR) 50-200 MG tablet, Take 1 tablet by mouth 2 (two) times daily. , Disp: , Rfl:  .  carbidopa-levodopa (SINEMET IR) 25-100 MG tablet, Take 1.5 tablets by mouth daily., Disp: , Rfl:  .  carvedilol (COREG) 3.125 MG tablet, Take 3.125 mg by mouth 2 (two) times daily., Disp: , Rfl:  .  nitroGLYCERIN (NITROSTAT) 0.4 MG SL tablet, Place 0.4 mg under the tongue every 5 (five) minutes as needed for chest pain., Disp: , Rfl:  .  rasagiline (AZILECT) 1 MG TABS tablet, Take 1 mg by mouth daily., Disp: , Rfl:  .  rosuvastatin (CRESTOR) 20 MG tablet, Take 20 mg by mouth at bedtime., Disp: , Rfl:  .  selegiline (ELDEPRYL) 5 MG capsule, Take 5 mg by mouth 2 (two) times daily with a meal., Disp: , Rfl:  .  sildenafil (VIAGRA) 100 MG tablet, Take 100 mg by mouth daily as needed for erectile dysfunction., Disp: , Rfl:  .  ticagrelor (BRILINTA) 90 MG TABS tablet, Take 90 mg by mouth every 12 (twelve) hours., Disp: , Rfl:  .  traZODone (DESYREL) 50 MG tablet, Take 50-75 mg by mouth at bedtime., Disp: ,  Rfl:  .  valACYclovir (VALTREX) 500 MG tablet, Take 500 mg by mouth 2 (two) times daily., Disp: , Rfl:   Past Medical History: Past Medical History  Diagnosis Date  . Coronary artery disease   . Hypertension   . Hyperlipidemia     Tobacco Use: History  Smoking status  . Never Smoker   Smokeless tobacco  . Not on file    Labs: Recent Review Flowsheet Data    There is no flowsheet data to display.       Exercise Target Goals:    Exercise Program Goal: Individual exercise prescription set with THRR, safety & activity barriers. Participant demonstrates ability to understand and report RPE using BORG scale, to self-measure pulse accurately, and to acknowledge the importance of the exercise prescription.  Exercise Prescription Goal: Starting with aerobic activity 30 plus minutes a day, 3 days per week for initial exercise prescription. Provide home exercise prescription and guidelines that participant acknowledges understanding prior to discharge.  Activity Barriers & Risk Stratification:     Activity Barriers & Risk Stratification - 07/25/15 1530    Activity Barriers & Risk Stratification   Activity Barriers Back Problems;Balance Concerns;History of Falls   Risk Stratification High      6 Minute Walk:     6 Minute Walk  07/25/15 1413       6 Minute Walk   Phase Initial     Distance 1500 feet     Walk Time 6 minutes     Resting HR 58 bpm     Resting BP 138/84 mmHg     Max Ex. HR 95 bpm     Max Ex. BP 136/80 mmHg     RPE 11     Symptoms No        Initial Exercise Prescription:     Initial Exercise Prescription - 07/25/15 1400    Date of Initial Exercise Prescription   Date 07/25/15   Treadmill   MPH 2.5   Grade 0   Minutes 15   Bike   Level 0.4   Minutes 10   Recumbant Bike   Level 3   RPM 50   Watts 25   Minutes 10   NuStep   Level 3   Watts 40   Minutes 15   Arm Ergometer   Level 1   Watts 8   Minutes 10   Arm/Foot Ergometer    Level 4   Watts 15   Minutes 10   Cybex   Level 1   RPM 50   Minutes 10   Recumbant Elliptical   Level 1   RPM 40   Watts 10   Minutes 10   Elliptical   Level 1   Speed 3   Minutes 1   REL-XR   Level 2   Watts 35   Minutes 15   Prescription Details   Frequency (times per week) 3   Duration Progress to 30 minutes of continuous aerobic without signs/symptoms of physical distress   Intensity   THRR REST +  30   Ratings of Perceived Exertion 11-15   Progression Continue progressive overload as per policy without signs/symptoms or physical distress.   Resistance Training   Training Prescription Yes   Weight 2   Reps 10-15      Exercise Prescription Changes:     Exercise Prescription Changes      08/11/15 0900 08/11/15 1300         Exercise Review   Progression  Yes      Response to Exercise   Blood Pressure (Admit)  118/74 mmHg      Blood Pressure (Exercise)  146/86 mmHg      Blood Pressure (Exit)  118/64 mmHg      Heart Rate (Admit)  62 bpm      Heart Rate (Exercise)  98 bpm      Heart Rate (Exit)  58 bpm      Rating of Perceived Exertion (Exercise)  12      Duration  Progress to 30 minutes of continuous aerobic without signs/symptoms of physical distress      Intensity  Rest + 30      Progression  Continue progressive overload as per policy without signs/symptoms or physical distress.      Resistance Training   Training Prescription  Yes      Weight  4      Reps  10-15      Interval Training   Interval Training  No      Treadmill   MPH  3      Grade  0      Minutes  15      Recumbant Bike   Level 4 4      RPM 65 65  Minutes 15 15      NuStep   Level  4      Watts  50      Minutes  15         Discharge Exercise Prescription (Final Exercise Prescription Changes):     Exercise Prescription Changes - 08/11/15 1300    Exercise Review   Progression Yes   Response to Exercise   Blood Pressure (Admit) 118/74 mmHg   Blood Pressure (Exercise)  146/86 mmHg   Blood Pressure (Exit) 118/64 mmHg   Heart Rate (Admit) 62 bpm   Heart Rate (Exercise) 98 bpm   Heart Rate (Exit) 58 bpm   Rating of Perceived Exertion (Exercise) 12   Duration Progress to 30 minutes of continuous aerobic without signs/symptoms of physical distress   Intensity Rest + 30   Progression Continue progressive overload as per policy without signs/symptoms or physical distress.   Resistance Training   Training Prescription Yes   Weight 4   Reps 10-15   Interval Training   Interval Training No   Treadmill   MPH 3   Grade 0   Minutes 15   Recumbant Bike   Level 4   RPM 65   Minutes 15   NuStep   Level 4   Watts 50   Minutes 15      Nutrition:  Target Goals: Understanding of nutrition guidelines, daily intake of sodium 1500mg , cholesterol 200mg , calories 30% from fat and 7% or less from saturated fats, daily to have 5 or more servings of fruits and vegetables.  Biometrics:     Pre Biometrics - 07/25/15 1406    Pre Biometrics   Height 5' 10.4" (1.788 m)   Weight 203 lb 4.8 oz (92.216 kg)   Waist Circumference 37.75 inches   Hip Circumference 39.75 inches   Waist to Hip Ratio 0.95 %   BMI (Calculated) 28.9       Nutrition Therapy Plan and Nutrition Goals:     Nutrition Therapy & Goals - 07/25/15 1529    Nutrition Therapy   Drug/Food Interactions Statins/Certain Fruits   Intervention Plan   Intervention Using nutrition plan and personal goals to gain a healthy nutrition lifestyle. Add exercise as prescribed.      Nutrition Discharge: Rate Your Plate Scores:   Nutrition Goals Re-Evaluation:     Nutrition Goals Re-Evaluation      09/08/15 1107           Personal Goal #1 Re-Evaluation   Personal Goal #1 Adriann has been dealing with his Parkinson's disease and going to a neurologist who is going to send him for physical therapy. Has been out of Cardiac Rehab since 08/11/2015       Goal Progress Seen No           Psychosocial: Target Goals: Acknowledge presence or absence of depression, maximize coping skills, provide positive support system. Participant is able to verbalize types and ability to use techniques and skills needed for reducing stress and depression.  Initial Review & Psychosocial Screening:   Quality of Life Scores:     Quality of Life - 07/25/15 1549    Quality of Life Scores   Health/Function Pre 14.67 %   Socioeconomic Pre 24.21 %   Psych/Spiritual Pre 21.07 %   Family Pre 22.5 %   GLOBAL Pre 19 %      PHQ-9:     Recent Review Flowsheet Data    Depression screen [facility] 2/9 07/25/2015  Decreased Interest 2   Down, Depressed, Hopeless 0   PHQ - 2 Score 2   Altered sleeping 3   Tired, decreased energy 3   Change in appetite 0   Feeling bad or failure about yourself  0   Trouble concentrating 0   Moving slowly or fidgety/restless 3   Suicidal thoughts 0   PHQ-9 Score 11   Difficult doing work/chores Somewhat difficult       Psychosocial Evaluation and Intervention:   Psychosocial Re-Evaluation:     Psychosocial Re-Evaluation      09/08/15 1109           Psychosocial Re-Evaluation   Interventions Encouraged to attend Cardiac Rehabilitation for the exercise;Stress management education       Comments Stress due to "late stage Parkinson's disease Rea reports". Cesare has been dealing with his Parkinson's disease and going to a neurologist who is going to send him for physical therapy. Has been out of Cardiac Rehab since 08/11/2015          Vocational Rehabilitation: Provide vocational rehab assistance to qualifying candidates.   Vocational Rehab Evaluation & Intervention:     Vocational Rehab - 07/25/15 1530    Initial Vocational Rehab Evaluation & Intervention   Assessment shows need for Vocational Rehabilitation No      Education: Education Goals: Education classes will be provided on a weekly basis, covering required topics. Participant  will state understanding/return demonstration of topics presented.  Learning Barriers/Preferences:     Learning Barriers/Preferences - 07/25/15 1530    Learning Barriers/Preferences   Learning Barriers None   Learning Preferences None      Education Topics: General Nutrition Guidelines/Fats and Fiber: -Group instruction provided by verbal, written material, models and posters to present the general guidelines for heart healthy nutrition. Gives an explanation and review of dietary fats and fiber.   Controlling Sodium/Reading Food Labels: -Group verbal and written material supporting the discussion of sodium use in heart healthy nutrition. Review and explanation with models, verbal and written materials for utilization of the food label.   Exercise Physiology & Risk Factors: - Group verbal and written instruction with models to review the exercise physiology of the cardiovascular system and associated critical values. Details cardiovascular disease risk factors and the goals associated with each risk factor.          Cardiac Rehab from 08/11/2015 in [facility] Cardiac Rehab   Date  08/11/15   Educator  SW   Instruction Review Code  2- meets goals/outcomes      Aerobic Exercise & Resistance Training: - Gives group verbal and written discussion on the health impact of inactivity. On the components of aerobic and resistive training programs and the benefits of this training and how to safely progress through these programs.   Flexibility, Balance, General Exercise Guidelines: - Provides group verbal and written instruction on the benefits of flexibility and balance training programs. Provides general exercise guidelines with specific guidelines to those with heart or lung disease. Demonstration and skill practice provided.   Stress Management: - Provides group verbal and written instruction about the health risks of elevated stress, cause of high stress, and healthy ways to reduce  stress.   Depression: - Provides group verbal and written instruction on the correlation between heart/lung disease and depressed mood, treatment options, and the stigmas associated with seeking treatment.   Anatomy & Physiology of the Heart: - Group verbal and written instruction and models provide basic cardiac anatomy and physiology, with  the coronary electrical and arterial systems. Review of: AMI, Angina, Valve disease, Heart Failure, Cardiac Arrhythmia, Pacemakers, and the ICD.   Cardiac Procedures: - Group verbal and written instruction and models to describe the testing methods done to diagnose heart disease. Reviews the outcomes of the test results. Describes the treatment choices: Medical Management, Angioplasty, or Coronary Bypass Surgery.   Cardiac Medications: - Group verbal and written instruction to review commonly prescribed medications for heart disease. Reviews the medication, class of the drug, and side effects. Includes the steps to properly store meds and maintain the prescription regimen.   Go Sex-Intimacy & Heart Disease, Get SMART - Goal Setting: - Group verbal and written instruction through game format to discuss heart disease and the return to sexual intimacy. Provides group verbal and written material to discuss and apply goal setting through the application of the S.M.A.R.T. Method.   Other Matters of the Heart: - Provides group verbal, written materials and models to describe Heart Failure, Angina, Valve Disease, and Diabetes in the realm of heart disease. Includes description of the disease process and treatment options available to the cardiac patient.   Exercise & Equipment Safety: - Individual verbal instruction and demonstration of equipment use and safety with use of the equipment.      Cardiac Rehab from 08/11/2015 in [facility] Cardiac Rehab   Date  07/25/15   Educator  C. Enterkin,RN   Instruction Review Code  1- partially meets, needs review/practice       Infection Prevention: - Provides verbal and written material to individual with discussion of infection control including proper hand washing and proper equipment cleaning during exercise session.      [facility]   Date  07/25/15   Educator  C. Enterkin,RN   Instruction Review Code  2- meets goals/outcomes      Falls Prevention: - Provides verbal and written material to individual with discussion of falls prevention and safety.      [facility]   Date  07/25/15   Educator  C. Enterkin,RN   Instruction Review Code  2- meets goals/outcomes      Diabetes: - Individual verbal and written instruction to review signs/symptoms of diabetes, desired ranges of glucose level fasting, after meals and with exercise. Advice that pre and post exercise glucose checks will be done for 3 sessions at entry of program.    Knowledge Questionnaire Score:     Knowledge Questionnaire Score - 07/25/15 1427    Knowledge Questionnaire Score   Pre Score 22      Personal Goals and Risk Factors at Admission:     Personal Goals and Risk Factors at Admission - 07/25/15 1531    Personal Goals and Risk Factors on Admission    Weight Management No   Increase Aerobic Exercise and Physical Activity Yes   Intervention While in program, learn and follow the exercise prescription taught. Start at a low level workload and increase workload after able to maintain previous level for 30 minutes. Increase time before increasing intensity.   Quit Smoking No   Understand more about Heart/Pulmonary Disease. Yes   Intervention While in program utilize professionals for any questions, and attend the education sessions. Great websites to use are www.americanheart.org or www.lung.org for reliable information.   Diabetes No   Hypertension Yes   Goal Participant will see blood pressure controlled within the values of 140/5090mm/Hg or within  value directed by their  physician.   Intervention Provide nutrition & aerobic exercise along with prescribed medications to achieve BP 140/90 or less.   Lipids Yes   Goal Cholesterol controlled with medications as prescribed, with individualized exercise RX and with personalized nutrition plan. Value goals: LDL < 70mg , HDL > 40mg . Participant states understanding of desired cholesterol values and following prescriptions.   Intervention Provide nutrition & aerobic exercise along with prescribed medications to achieve LDL 70mg , HDL >40mg .   Stress Yes   Goal To meet with psychosocial counselor for stress and relaxation information and guidance. To state understanding of performing relaxation techniques and or identifying personal stressors.   Intervention Provide education on types of stress, identifiying stressors, and ways to cope with stress. Provide demonstration and active practice of relaxation techniques.   Personal Goal Other No      Personal Goals and Risk Factors Review:      Goals and Risk Factor Review      09/08/15 1108 09/14/15 1417         Increase Aerobic Exercise and Physical Activity   Goals Progress/Improvement seen  No No      Comments [name] has been dealing with his Parkinson's disease and going to a neurologist who is going to send him for physical therapy. Has been out of Cardiac Rehab since 08/11/2015 Out a lot.      Hypertension   Goal --  Bp checked at his neurology appt lately.        Stress   Goal --  [name] has been dealing with his Parkinson's disease and going to a neurologist who is going to send him for physical therapy. Has been out of Cardiac Rehab since 08/11/2015          Personal Goals Discharge (Final Personal Goals and Risk Factors Review):      Goals and Risk Factor Review - 09/14/15 1417    Increase Aerobic Exercise and Physical Activity   Goals Progress/Improvement seen  No   Comments Out a lot.       Comments: 30 day review. Continue  with ITP.

## 2015-10-12 NOTE — Progress Notes (Signed)
Cardiac Individual Treatment Plan  Patient Details  Name: KILLIAN SCHWER MRN: [id number] Date of Birth: February 07, 1956 Referring Provider:  No ref. provider found  Initial Encounter Date:    Visit Diagnosis: No diagnosis found.  Patient's Home Medications on Admission:  Current outpatient prescriptions:  .  acetaminophen (TYLENOL) 500 MG tablet, Take 1,000 mg by mouth every 6 (six) hours as needed for mild pain., Disp: , Rfl:  .  albuterol (PROVENTIL HFA;VENTOLIN HFA) 108 (90 BASE) MCG/ACT inhaler, Inhale 2 puffs into the lungs every 6 (six) hours as needed for wheezing or shortness of breath., Disp: , Rfl:  .  ALPRAZolam (XANAX) 0.5 MG tablet, Take 0.5 mg by mouth 2 (two) times daily as needed for anxiety., Disp: , Rfl:  .  ARTIFICIAL TEAR OP, Apply 1 drop to eye as needed (for dry eyes)., Disp: , Rfl:  .  aspirin EC 81 MG tablet, Take 81 mg by mouth daily., Disp: , Rfl:  .  carbidopa-levodopa (SINEMET CR) 50-200 MG tablet, Take 1 tablet by mouth 2 (two) times daily. , Disp: , Rfl:  .  carbidopa-levodopa (SINEMET IR) 25-100 MG tablet, Take 1.5 tablets by mouth daily., Disp: , Rfl:  .  carvedilol (COREG) 3.125 MG tablet, Take 3.125 mg by mouth 2 (two) times daily., Disp: , Rfl:  .  nitroGLYCERIN (NITROSTAT) 0.4 MG SL tablet, Place 0.4 mg under the tongue every 5 (five) minutes as needed for chest pain., Disp: , Rfl:  .  rasagiline (AZILECT) 1 MG TABS tablet, Take 1 mg by mouth daily., Disp: , Rfl:  .  rosuvastatin (CRESTOR) 20 MG tablet, Take 20 mg by mouth at bedtime., Disp: , Rfl:  .  selegiline (ELDEPRYL) 5 MG capsule, Take 5 mg by mouth 2 (two) times daily with a meal., Disp: , Rfl:  .  sildenafil (VIAGRA) 100 MG tablet, Take 100 mg by mouth daily as needed for erectile dysfunction., Disp: , Rfl:  .  ticagrelor (BRILINTA) 90 MG TABS tablet, Take 90 mg by mouth every 12 (twelve) hours., Disp: , Rfl:  .  traZODone (DESYREL) 50 MG tablet, Take 50-75 mg by mouth at bedtime., Disp: , Rfl:   .  valACYclovir (VALTREX) 500 MG tablet, Take 500 mg by mouth 2 (two) times daily., Disp: , Rfl:   Past Medical History: Past Medical History  Diagnosis Date  . Coronary artery disease   . Hypertension   . Hyperlipidemia     Tobacco Use: History  Smoking status  . Never Smoker   Smokeless tobacco  . Not on file    Labs: Recent Review Flowsheet Data    There is no flowsheet data to display.       Exercise Target Goals:    Exercise Program Goal: Individual exercise prescription set with THRR, safety & activity barriers. Participant demonstrates ability to understand and report RPE using BORG scale, to self-measure pulse accurately, and to acknowledge the importance of the exercise prescription.  Exercise Prescription Goal: Starting with aerobic activity 30 plus minutes a day, 3 days per week for initial exercise prescription. Provide home exercise prescription and guidelines that participant acknowledges understanding prior to discharge.  Activity Barriers & Risk Stratification:     Activity Barriers & Risk Stratification - 07/25/15 1530    Activity Barriers & Risk Stratification   Activity Barriers Back Problems;Balance Concerns;History of Falls   Risk Stratification High      6 Minute Walk:     6 Minute Walk  07/25/15 1413       6 Minute Walk   Phase Initial     Distance 1500 feet     Walk Time 6 minutes     Resting HR 58 bpm     Resting BP 138/84 mmHg     Max Ex. HR 95 bpm     Max Ex. BP 136/80 mmHg     RPE 11     Symptoms No        Initial Exercise Prescription:     Initial Exercise Prescription - 07/25/15 1400    Date of Initial Exercise Prescription   Date 07/25/15   Treadmill   MPH 2.5   Grade 0   Minutes 15   Bike   Level 0.4   Minutes 10   Recumbant Bike   Level 3   RPM 50   Watts 25   Minutes 10   NuStep   Level 3   Watts 40   Minutes 15   Arm Ergometer   Level 1   Watts 8   Minutes 10   Arm/Foot Ergometer    Level 4   Watts 15   Minutes 10   Cybex   Level 1   RPM 50   Minutes 10   Recumbant Elliptical   Level 1   RPM 40   Watts 10   Minutes 10   Elliptical   Level 1   Speed 3   Minutes 1   REL-XR   Level 2   Watts 35   Minutes 15   Prescription Details   Frequency (times per week) 3   Duration Progress to 30 minutes of continuous aerobic without signs/symptoms of physical distress   Intensity   THRR REST +  30   Ratings of Perceived Exertion 11-15   Progression Continue progressive overload as per policy without signs/symptoms or physical distress.   Resistance Training   Training Prescription Yes   Weight 2   Reps 10-15      Exercise Prescription Changes:     Exercise Prescription Changes      08/11/15 0900 08/11/15 1300         Exercise Review   Progression  Yes      Response to Exercise   Blood Pressure (Admit)  118/74 mmHg      Blood Pressure (Exercise)  146/86 mmHg      Blood Pressure (Exit)  118/64 mmHg      Heart Rate (Admit)  62 bpm      Heart Rate (Exercise)  98 bpm      Heart Rate (Exit)  58 bpm      Rating of Perceived Exertion (Exercise)  12      Duration  Progress to 30 minutes of continuous aerobic without signs/symptoms of physical distress      Intensity  Rest + 30      Progression  Continue progressive overload as per policy without signs/symptoms or physical distress.      Resistance Training   Training Prescription  Yes      Weight  4      Reps  10-15      Interval Training   Interval Training  No      Treadmill   MPH  3      Grade  0      Minutes  15      Recumbant Bike   Level 4 4      RPM 65 65  Minutes 15 15      NuStep   Level  4      Watts  50      Minutes  15         Discharge Exercise Prescription (Final Exercise Prescription Changes):     Exercise Prescription Changes - 08/11/15 1300    Exercise Review   Progression Yes   Response to Exercise   Blood Pressure (Admit) 118/74 mmHg   Blood Pressure (Exercise)  146/86 mmHg   Blood Pressure (Exit) 118/64 mmHg   Heart Rate (Admit) 62 bpm   Heart Rate (Exercise) 98 bpm   Heart Rate (Exit) 58 bpm   Rating of Perceived Exertion (Exercise) 12   Duration Progress to 30 minutes of continuous aerobic without signs/symptoms of physical distress   Intensity Rest + 30   Progression Continue progressive overload as per policy without signs/symptoms or physical distress.   Resistance Training   Training Prescription Yes   Weight 4   Reps 10-15   Interval Training   Interval Training No   Treadmill   MPH 3   Grade 0   Minutes 15   Recumbant Bike   Level 4   RPM 65   Minutes 15   NuStep   Level 4   Watts 50   Minutes 15      Nutrition:  Target Goals: Understanding of nutrition guidelines, daily intake of sodium 1500mg , cholesterol 200mg , calories 30% from fat and 7% or less from saturated fats, daily to have 5 or more servings of fruits and vegetables.  Biometrics:     Pre Biometrics - 07/25/15 1406    Pre Biometrics   Height 5' 10.4" (1.788 m)   Weight 203 lb 4.8 oz (92.216 kg)   Waist Circumference 37.75 inches   Hip Circumference 39.75 inches   Waist to Hip Ratio 0.95 %   BMI (Calculated) 28.9       Nutrition Therapy Plan and Nutrition Goals:     Nutrition Therapy & Goals - 07/25/15 1529    Nutrition Therapy   Drug/Food Interactions Statins/Certain Fruits   Intervention Plan   Intervention Using nutrition plan and personal goals to gain a healthy nutrition lifestyle. Add exercise as prescribed.      Nutrition Discharge: Rate Your Plate Scores:   Nutrition Goals Re-Evaluation:     Nutrition Goals Re-Evaluation      09/08/15 1107           Personal Goal #1 Re-Evaluation   Personal Goal #1 Elisandro has been dealing with his Parkinson's disease and going to a neurologist who is going to send him for physical therapy. Has been out of Cardiac Rehab since 08/11/2015       Goal Progress Seen No           Psychosocial: Target Goals: Acknowledge presence or absence of depression, maximize coping skills, provide positive support system. Participant is able to verbalize types and ability to use techniques and skills needed for reducing stress and depression.  Initial Review & Psychosocial Screening:   Quality of Life Scores:     Quality of Life - 07/25/15 1549    Quality of Life Scores   Health/Function Pre 14.67 %   Socioeconomic Pre 24.21 %   Psych/Spiritual Pre 21.07 %   Family Pre 22.5 %   GLOBAL Pre 19 %      PHQ-9:     Recent Review Flowsheet Data    Depression screen [facility] 2/9 07/25/2015  Decreased Interest 2   Down, Depressed, Hopeless 0   PHQ - 2 Score 2   Altered sleeping 3   Tired, decreased energy 3   Change in appetite 0   Feeling bad or failure about yourself  0   Trouble concentrating 0   Moving slowly or fidgety/restless 3   Suicidal thoughts 0   PHQ-9 Score 11   Difficult doing work/chores Somewhat difficult       Psychosocial Evaluation and Intervention:   Psychosocial Re-Evaluation:     Psychosocial Re-Evaluation      09/08/15 1109           Psychosocial Re-Evaluation   Interventions Encouraged to attend Cardiac Rehabilitation for the exercise;Stress management education       Comments Stress due to "late stage Parkinson's disease Elisa reports". Robel has been dealing with his Parkinson's disease and going to a neurologist who is going to send him for physical therapy. Has been out of Cardiac Rehab since 08/11/2015          Vocational Rehabilitation: Provide vocational rehab assistance to qualifying candidates.   Vocational Rehab Evaluation & Intervention:     Vocational Rehab - 07/25/15 1530    Initial Vocational Rehab Evaluation & Intervention   Assessment shows need for Vocational Rehabilitation No      Education: Education Goals: Education classes will be provided on a weekly basis, covering required topics. Participant  will state understanding/return demonstration of topics presented.  Learning Barriers/Preferences:     Learning Barriers/Preferences - 07/25/15 1530    Learning Barriers/Preferences   Learning Barriers None   Learning Preferences None      Education Topics: General Nutrition Guidelines/Fats and Fiber: -Group instruction provided by verbal, written material, models and posters to present the general guidelines for heart healthy nutrition. Gives an explanation and review of dietary fats and fiber.   Controlling Sodium/Reading Food Labels: -Group verbal and written material supporting the discussion of sodium use in heart healthy nutrition. Review and explanation with models, verbal and written materials for utilization of the food label.   Exercise Physiology & Risk Factors: - Group verbal and written instruction with models to review the exercise physiology of the cardiovascular system and associated critical values. Details cardiovascular disease risk factors and the goals associated with each risk factor.          Cardiac Rehab from 08/11/2015 in [facility] Cardiac Rehab   Date  08/11/15   Educator  SW   Instruction Review Code  2- meets goals/outcomes      Aerobic Exercise & Resistance Training: - Gives group verbal and written discussion on the health impact of inactivity. On the components of aerobic and resistive training programs and the benefits of this training and how to safely progress through these programs.   Flexibility, Balance, General Exercise Guidelines: - Provides group verbal and written instruction on the benefits of flexibility and balance training programs. Provides general exercise guidelines with specific guidelines to those with heart or lung disease. Demonstration and skill practice provided.   Stress Management: - Provides group verbal and written instruction about the health risks of elevated stress, cause of high stress, and healthy ways to reduce  stress.   Depression: - Provides group verbal and written instruction on the correlation between heart/lung disease and depressed mood, treatment options, and the stigmas associated with seeking treatment.   Anatomy & Physiology of the Heart: - Group verbal and written instruction and models provide basic cardiac anatomy and physiology, with  the coronary electrical and arterial systems. Review of: AMI, Angina, Valve disease, Heart Failure, Cardiac Arrhythmia, Pacemakers, and the ICD.   Cardiac Procedures: - Group verbal and written instruction and models to describe the testing methods done to diagnose heart disease. Reviews the outcomes of the test results. Describes the treatment choices: Medical Management, Angioplasty, or Coronary Bypass Surgery.   Cardiac Medications: - Group verbal and written instruction to review commonly prescribed medications for heart disease. Reviews the medication, class of the drug, and side effects. Includes the steps to properly store meds and maintain the prescription regimen.   Go Sex-Intimacy & Heart Disease, Get SMART - Goal Setting: - Group verbal and written instruction through game format to discuss heart disease and the return to sexual intimacy. Provides group verbal and written material to discuss and apply goal setting through the application of the S.M.A.R.T. Method.   Other Matters of the Heart: - Provides group verbal, written materials and models to describe Heart Failure, Angina, Valve Disease, and Diabetes in the realm of heart disease. Includes description of the disease process and treatment options available to the cardiac patient.   Exercise & Equipment Safety: - Individual verbal instruction and demonstration of equipment use and safety with use of the equipment.      Cardiac Rehab from 08/11/2015 in [facility] Cardiac Rehab   Date  07/25/15   Educator  C. Enterkin,RN   Instruction Review Code  1- partially meets, needs review/practice       Infection Prevention: - Provides verbal and written material to individual with discussion of infection control including proper hand washing and proper equipment cleaning during exercise session.      [facility]   Date  07/25/15   Educator  C. Enterkin,RN   Instruction Review Code  2- meets goals/outcomes      Falls Prevention: - Provides verbal and written material to individual with discussion of falls prevention and safety.      [facility]   Date  07/25/15   Educator  C. Enterkin,RN   Instruction Review Code  2- meets goals/outcomes      Diabetes: - Individual verbal and written instruction to review signs/symptoms of diabetes, desired ranges of glucose level fasting, after meals and with exercise. Advice that pre and post exercise glucose checks will be done for 3 sessions at entry of program.    Knowledge Questionnaire Score:     Knowledge Questionnaire Score - 07/25/15 1427    Knowledge Questionnaire Score   Pre Score 22      Personal Goals and Risk Factors at Admission:     Personal Goals and Risk Factors at Admission - 07/25/15 1531    Personal Goals and Risk Factors on Admission    Weight Management No   Increase Aerobic Exercise and Physical Activity Yes   Intervention While in program, learn and follow the exercise prescription taught. Start at a low level workload and increase workload after able to maintain previous level for 30 minutes. Increase time before increasing intensity.   Quit Smoking No   Understand more about Heart/Pulmonary Disease. Yes   Intervention While in program utilize professionals for any questions, and attend the education sessions. Great websites to use are www.americanheart.org or www.lung.org for reliable information.   Diabetes No   Hypertension Yes   Goal Participant will see blood pressure controlled within the values of 140/5090mm/Hg or within  value directed by their  physician.   Intervention Provide nutrition & aerobic exercise along with prescribed medications to achieve BP 140/90 or less.   Lipids Yes   Goal Cholesterol controlled with medications as prescribed, with individualized exercise RX and with personalized nutrition plan. Value goals: LDL < 70mg , HDL > 40mg . Participant states understanding of desired cholesterol values and following prescriptions.   Intervention Provide nutrition & aerobic exercise along with prescribed medications to achieve LDL 70mg , HDL >40mg .   Stress Yes   Goal To meet with psychosocial counselor for stress and relaxation information and guidance. To state understanding of performing relaxation techniques and or identifying personal stressors.   Intervention Provide education on types of stress, identifiying stressors, and ways to cope with stress. Provide demonstration and active practice of relaxation techniques.   Personal Goal Other No      Personal Goals and Risk Factors Review:      Goals and Risk Factor Review      09/08/15 1108 09/14/15 1417         Increase Aerobic Exercise and Physical Activity   Goals Progress/Improvement seen  No No      Comments Cope has been dealing with his Parkinson's disease and going to a neurologist who is going to send him for physical therapy. Has been out of Cardiac Rehab since 08/11/2015 Out a lot.      Hypertension   Goal --  Bp checked at his neurology appt lately.        Stress   Goal --  Yorel has been dealing with his Parkinson's disease and going to a neurologist who is going to send him for physical therapy. Has been out of Cardiac Rehab since 08/11/2015          Personal Goals Discharge (Final Personal Goals and Risk Factors Review):      Goals and Risk Factor Review - 09/14/15 1417    Increase Aerobic Exercise and Physical Activity   Goals Progress/Improvement seen  No   Comments Out a lot.       Comments: 30 day review. Continue  with ITP.

## 2015-10-13 ENCOUNTER — Ambulatory Visit: Payer: BLUE CROSS/BLUE SHIELD | Admitting: Occupational Therapy

## 2015-10-13 DIAGNOSIS — R2681 Unsteadiness on feet: Secondary | ICD-10-CM

## 2015-10-13 DIAGNOSIS — G20 Parkinson's disease: Secondary | ICD-10-CM

## 2015-10-13 DIAGNOSIS — R262 Difficulty in walking, not elsewhere classified: Secondary | ICD-10-CM

## 2015-10-13 DIAGNOSIS — R279 Unspecified lack of coordination: Secondary | ICD-10-CM

## 2015-10-13 DIAGNOSIS — M6281 Muscle weakness (generalized): Secondary | ICD-10-CM

## 2015-10-13 NOTE — Therapy (Signed)
[facility] MAIN [facility] SERVICES [address] [place], [place], [address] Phone: [phone]   Fax:  [phone]  Occupational Therapy Treatment  Patient Details  Name: Sean Barajas MRN: [id number] Date of Birth: [birth year] No Data Recorded  Encounter Date: 10/11/2015      OT End of Session - 10/13/15 1531    Visit Number 3   Number of Visits 17   Date for OT Re-Evaluation 11/07/15      Past Medical History  Diagnosis Date  . Coronary artery disease   . Hypertension   . Hyperlipidemia     Past Surgical History  Procedure Laterality Date  . Cardiac catheterization    . Coronary angioplasty    . Coronary artery bypass graft      There were no vitals filed for this visit.  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    Patient Stated Goals Patient reports he wants to walk without stumbling, better balance.     Currently in Pain? No/denies   Pain Score 0-No pain      Patient seen for initial instruction of LSVT BIG exercises:  LSVT Daily Session Maximal Daily Exercises: Sustained movements are designed to rescale the amplitude of movement output for generalization to daily functional activities. Performed as follows for 1 set of 10 repetitions each: Multi directional sustained movements- 1) Floor to ceiling, 2) Side to side. Multi directional Repetitive movements performed in standing and are designed to provide retraining effort needed for sustained muscle activation in tasks Performed as follows: 3) Step and reach forward, 4) Step and Reach Backwards, 5) Step and reach sideways, 6) Rock and reach forward/backward, 7) Rock and reach sideways. Sit to stand from  mat table on lowest setting with cues for weight shift, technique and CGA for 5 reps for 2 sets. Patient seen for functional mobility tasks this date with emphasis on gait speed, length of steps with short distance ambulation. Patient requires cues for BIG movements and cadence. All standing exercises performed this date with CGA, verbal and tactile cues as needed.    Functional mobility utilizing LSVT BIG techniques for 3 trials of 250 feet, cues for increasing amplitude of steps as well as reciprocal arm swing.                        OT Education - 10/13/15 1529    Education provided Yes   Education Details LSVT BIG exercises, issued written/pictorial instructions., amplitude of gait   Person(s) Educated Patient   Methods Explanation;Demonstration;Verbal cues   Comprehension Verbal cues required;Returned demonstration;Verbalized understanding             OT Long Term Goals - 09/28/15 1634    OT LONG TERM GOAL #1   Title Patient will improve gait speed and endurance and be able to walk 1500 feet in 6 minutes to negotiate around the home and community safely in 4 weeks.   Baseline 1315 feet at evaluation   Time 4   Period Weeks   Status New   OT LONG TERM GOAL #2   Title Patient will complete HEP for maximal daily exercises with modified independence  in 4 weeks   Baseline no current exercise program for parkinson's   Time 4   Period Weeks   Status New   OT LONG TERM GOAL #3   Title Patient will transfer from sit to stand without the use of arms safely and independently from a variety of chairs/surfaces in 4 weeks.    Baseline difficulty from low surfaces   Time 4   Period Weeks   Status New   OT LONG TERM GOAL #4   Title Patient will decrease frequency of freezing episodes with score of 8 or less on Freezing of Gait Questionnaire    Baseline score of 9 at evaluation.    OT LONG TERM GOAL #5   Title Patient will be modified independent with home and work  tasks including squatting to pick up items from the floor.   Time 4   Period Weeks   Status New               Plan - 10/13/15 1531    Clinical Impression Statement Patient is progressing with exercises this date and responds well to verbal and tactile cues, able to recall portions of exercises from previous date.  Incorporated balance activities this session which is a primary focus area for patient.     Pt will benefit from skilled therapeutic intervention in order to improve on the following deficits (Retired) Abnormal gait;Decreased knowledge of use of DME;Decreased strength;Decreased balance;Decreased mobility;Difficulty walking;Decreased coordination   Rehab Potential Good   OT Frequency 4x / week   OT Duration 4 weeks   OT Treatment/Interventions Self-care/ADL training;Therapeutic exercise;Functional Mobility Training;Patient/family education;Neuromuscular education;DME and/or AE instruction;Gait Training;Stair Training   Consulted and Agree with Plan of Care Patient        Problem List There are no active problems to display for this patient.  Kerrie Buffalomy T Uzma Hellmer, OTR/L, CLT Savian Mazon 10/13/2015, 3:35 PM  [facility] MAIN [facility] [address] [place], [place], [address] Phone: [phone]   Fax:  [phone]  Name: Sean Barajas MRN: [id number] Date of Birth: 09/23/1956

## 2015-10-13 NOTE — Therapy (Signed)
[facility] [address] [place], [place], [address] Phone: [phone]   Fax:  [phone]  Occupational Therapy Treatment  Patient Details  Name: Sean EdisonRobert M Mancinas MRN: [id number] Date of Birth: 09/17/1956 No Data Recorded  Encounter Date: 10/12/2015      OT End of Session - 10/13/15 1531    Visit Number 3   Number of Visits 17   Date for OT Re-Evaluation 11/07/15      Past Medical History  Diagnosis Date  . Coronary artery disease   . Hypertension   . Hyperlipidemia     Past Surgical History  Procedure Laterality Date  . Cardiac catheterization    . Coronary angioplasty    . Coronary artery bypass graft      There were no vitals filed for this visit.  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    Patient Stated Goals Patient reports he wants to walk without stumbling, better balance.     Currently in Pain? No/denies   Pain Score 0-No pain                      OT Treatments/Exercises (OP) - 10/13/15 1609    Neurological Re-education Exercises   Other Exercises 1 Patient seen for initial instruction of LSVT BIG exercises:  LSVT Daily Session Maximal Daily Exercises: Sustained movements are designed to rescale the amplitude of movement output for generalization to daily functional activities. Performed as follows for 1 set of 10 repetitions each: Multi directional sustained movements- 1) Floor to ceiling, 2) Side to side. Multi directional Repetitive movements performed in standing and are designed to provide retraining effort needed for sustained muscle activation in tasks Performed as follows: 3) Step and reach forward, 4)  Step and Reach Backwards, 5) Step and reach sideways, 6) Rock and reach forward/backward, 7) Rock and reach sideways. Sit to stand from mat table on lowest setting with cues for weight shift, technique and CGA for 5 reps for 2 sets. Patient seen for functional mobility tasks this date with emphasis on gait speed, length of steps with short distance ambulation. Patient requires cues for BIG movements and cadence. All standing exercises performed this date with CGA, verbal and tactile cues as needed.   Other Exercises 2  Functional mobility utilizing LSVT BIG techniques for 3 trials of 300 feet, cues for increasing amplitude of steps as well as reciprocal arm swing..  Added leg press for 10 reps for 2 sets, 120# for increased quad strength to assist with sit to stand. Stair negotiation for 5 steps for 5 trials with cues for cues and reciprocal stepping patterns.  Toe taps alternating for 10 reps each, bilaterally.  Balance acts in standing on balance pad with incorporating use of arms during tasks as well as occasional squatting to pick up items with CGA.                OT Education - 10/13/15 1529    Education provided Yes   Education Details LSVT BIG exercises, issued written/pictorial instructions., amplitude of gait   Person(s) Educated Patient   Methods Explanation;Demonstration;Verbal cues   Comprehension Verbal cues required;Returned  demonstration;Verbalized understanding             OT Long Term Goals - 09/28/15 1634    OT LONG TERM GOAL #1   Title Patient will improve gait speed and endurance and be able to walk 1500 feet in 6 minutes to negotiate around the home and community safely in 4 weeks.   Baseline 1315 feet at evaluation   Time 4   Period Weeks   Status New   OT LONG TERM GOAL #2   Title Patient will complete HEP for maximal daily exercises with modified independence in 4 weeks   Baseline no current exercise program for parkinson's   Time 4   Period Weeks    Status New   OT LONG TERM GOAL #3   Title Patient will transfer from sit to stand without the use of arms safely and independently from a variety of chairs/surfaces in 4 weeks.    Baseline difficulty from low surfaces   Time 4   Period Weeks   Status New   OT LONG TERM GOAL #4   Title Patient will decrease frequency of freezing episodes with score of 8 or less on Freezing of Gait Questionnaire    Baseline score of 9 at evaluation.    OT LONG TERM GOAL #5   Title Patient will be modified independent with home and work tasks including squatting to pick up items from the floor.   Time 4   Period Weeks   Status New               Plan - 10/13/15 1531    Clinical Impression Statement Patient is progressing with exercises this date and responds well to verbal and tactile cues, able to recall portions of exercises from previous date.  Incorporated balance activities this session which is a primary focus area for patient.     Pt will benefit from skilled therapeutic intervention in order to improve on the following deficits (Retired) Abnormal gait;Decreased knowledge of use of DME;Decreased strength;Decreased balance;Decreased mobility;Difficulty walking;Decreased coordination   Rehab Potential Good   OT Frequency 4x / week   OT Duration 4 weeks   OT Treatment/Interventions Self-care/ADL training;Therapeutic exercise;Functional Mobility Training;Patient/family education;Neuromuscular education;DME and/or AE instruction;Gait Training;Stair Training   Consulted and Agree with Plan of Care Patient        Problem List There are no active problems to display for this patient.  Sean Barajas, OTR/L, CLT, Krisanne Lich 10/13/2015, 4:14 PM  [place] [facility] MAIN [facility] SERVICES [address] [place], [place], [address] Phone: [phone]   Fax:  [phone]  Name: Sean Barajas MRN: [id number] Date of Birth: [birth year]

## 2015-10-14 ENCOUNTER — Ambulatory Visit: Payer: BLUE CROSS/BLUE SHIELD | Admitting: Occupational Therapy

## 2015-10-14 NOTE — Therapy (Signed)
[place] [facility] MAIN [facility] SERVICES [address] [place], [place], [address] Phone: [phone]   Fax:  [phone]  Occupational Therapy Treatment  Patient Details  Name: Sean Barajas MRN: [id number] Date of Birth: 11/30/1956 No Data Recorded  Encounter Date: 10/13/2015      OT End of Session - 10/13/15 1620    Visit Number 5   Number of Visits 17   Date for OT Re-Evaluation 11/07/15   OT Start Time 1105   OT Stop Time 1202   OT Time Calculation (min) 57 min   Activity Tolerance Patient tolerated treatment well   Behavior During Therapy [facility] for tasks assessed/performed      Past Medical History  Diagnosis Date  . Coronary artery disease   . Hypertension   . Hyperlipidemia     Past Surgical History  Procedure Laterality Date  . Cardiac catheterization    . Coronary angioplasty    . Coronary artery bypass graft      There were no vitals filed for this visit.  Visit Diagnosis:  Parkinson's disease (HCC)  Muscle weakness (generalized)  Lack of coordination  Difficulty walking  Unsteady gait      Subjective Assessment - 10/13/15 1616    Subjective  Patient reports he did try the exercises last night but did not feel he was able to do a great job.  Feels he needs more instruction and practice.  Had to get up early this morning for a work call and has been busy today.  Planning to perform exercises tonight for a second set.    Patient Stated Goals Patient reports he wants to walk without stumbling, better balance.     Currently in Pain? No/denies   Pain Score 0-No pain                      OT Treatments/Exercises (OP) - 10/13/15 1617    Neurological Re-education Exercises   Other Exercises 1 Patient seen for initial instruction of LSVT BIG exercises: LSVT Daily Session Maximal Daily Exercises: Sustained movements are designed to rescale the amplitude of movement output for generalization to daily  functional activities. Performed as follows for 1 set of 10 repetitions each: Multi directional sustained movements- 1) Floor to ceiling, 2) Side to side. Multi directional Repetitive movements performed in standing and are designed to provide retraining effort needed for sustained muscle activation in tasks Performed as follows: 3) Step and reach forward, 4) Step and Reach Backwards, 5) Step and reach sideways, 6) Rock and reach forward/backward, 7) Rock and reach sideways. Sit to stand from mat table on lowest setting with cues for weight shift, technique and CGA for 5 reps for 2 sets. Patient seen for functional mobility tasks this date with emphasis on gait speed, length of steps with short distance ambulation. Patient requires cues for BIG movements and cadence. All standing exercises performed this date with CGA, verbal and tactile cues as needed.   Other Exercises 2 Functional mobility utilizing LSVT BIG techniques for 2 trials of 500 feet, cues for increasing amplitude of steps as well as reciprocal arm swing. Leg press for 10 reps for 2 sets, 120# for increased quad strength to assist with sit to stand. Stair negotiation for 5 steps for 5 trials with cues for cues and reciprocal stepping patterns. Toe taps alternating for 10 reps each, bilaterally.  Sit to stand for 10 reps from mat and then repeated from a variety of surfaces  during functional mobility.                 OT Education - 10/13/15 1620    Education provided Yes   Education Details Balance tasks, LSVT BIG exercises   Person(s) Educated Patient   Methods Explanation;Demonstration;Verbal cues   Comprehension Verbal cues required;Returned demonstration;Verbalized understanding             OT Long Term Goals - 09/28/15 1634    OT LONG TERM GOAL #1   Title Patient will improve gait speed and endurance and be able to walk 1500 feet in 6 minutes to negotiate around the home and community safely in 4 weeks.   Baseline 1315  feet at evaluation   Time 4   Period Weeks   Status New   OT LONG TERM GOAL #2   Title Patient will complete HEP for maximal daily exercises with modified independence in 4 weeks   Baseline no current exercise program for parkinson's   Time 4   Period Weeks   Status New   OT LONG TERM GOAL #3   Title Patient will transfer from sit to stand without the use of arms safely and independently from a variety of chairs/surfaces in 4 weeks.    Baseline difficulty from low surfaces   Time 4   Period Weeks   Status New   OT LONG TERM GOAL #4   Title Patient will decrease frequency of freezing episodes with score of 8 or less on Freezing of Gait Questionnaire    Baseline score of 9 at evaluation.    OT LONG TERM GOAL #5   Title Patient will be modified independent with home and work tasks including squatting to pick up items from the floor.   Time 4   Period Weeks   Status New               Plan - 10/13/15 1622    Clinical Impression Statement Patient will need to perform HEP 2x a day for the next 3 days and will return on Monday of next week.  He has a written/pictorial handout as a guide and has demonstrated knowledge of exercises in the clinic.  He has continued to require cues for proper technique with select exercises and CGA at times especially with stepping backwards exercise in which it is recommended he use a chair for a modified technique at home.  He continues to progress with amplitude of gait and demos signs of internal feedback to correct some of these deficit areas.  Will continue to work towards challenging of balance especially with squatting and bending to retrieve items.     Pt will benefit from skilled therapeutic intervention in order to improve on the following deficits (Retired) Abnormal gait;Decreased knowledge of use of DME;Decreased strength;Decreased balance;Decreased mobility;Difficulty walking;Decreased coordination   Rehab Potential Good   OT Frequency 4x /  week   OT Duration 4 weeks   OT Treatment/Interventions Self-care/ADL training;Therapeutic exercise;Functional Mobility Training;Patient/family education;Neuromuscular education;DME and/or AE instruction;Gait Training;Stair Training   Consulted and Agree with Plan of Care Patient        Problem List There are no active problems to display for this patient.  Sean Barajas, Sean Barajas, Sean Barajas Sean Barajas 10/14/2015, 4:30 PM  [facility] [address] [place], [place], [address] Phone: [phone]   Fax:  [phone]  Name: Sean Barajas MRN: [id number] Date of Birth: 06/13/1956

## 2015-10-17 ENCOUNTER — Ambulatory Visit: Payer: BLUE CROSS/BLUE SHIELD | Admitting: Occupational Therapy

## 2015-10-17 DIAGNOSIS — R2681 Unsteadiness on feet: Secondary | ICD-10-CM

## 2015-10-17 DIAGNOSIS — R262 Difficulty in walking, not elsewhere classified: Secondary | ICD-10-CM

## 2015-10-17 DIAGNOSIS — R279 Unspecified lack of coordination: Secondary | ICD-10-CM

## 2015-10-17 DIAGNOSIS — G20 Parkinson's disease: Secondary | ICD-10-CM | POA: Diagnosis not present

## 2015-10-17 DIAGNOSIS — M6281 Muscle weakness (generalized): Secondary | ICD-10-CM

## 2015-10-18 ENCOUNTER — Ambulatory Visit: Payer: BLUE CROSS/BLUE SHIELD | Admitting: Occupational Therapy

## 2015-10-18 DIAGNOSIS — R2681 Unsteadiness on feet: Secondary | ICD-10-CM

## 2015-10-18 DIAGNOSIS — M6281 Muscle weakness (generalized): Secondary | ICD-10-CM

## 2015-10-18 DIAGNOSIS — R279 Unspecified lack of coordination: Secondary | ICD-10-CM

## 2015-10-18 DIAGNOSIS — R262 Difficulty in walking, not elsewhere classified: Secondary | ICD-10-CM

## 2015-10-18 DIAGNOSIS — G20 Parkinson's disease: Secondary | ICD-10-CM

## 2015-10-18 NOTE — Therapy (Signed)
[place] [facility] [address] [place], [place], [address] Phone: [phone]   Fax:  [phone]  Occupational Therapy Treatment  Patient Details  Name: Sean EdisonRobert M Freyre MRN: [id number] Date of Birth: 07/22/1956 No Data Recorded  Encounter Date: 10/17/2015      OT End of Session - 10/17/15 2006    Visit Number 6   Number of Visits 17   Date for OT Re-Evaluation 11/07/15   OT Start Time 1101   OT Stop Time 1203   OT Time Calculation (min) 62 min   Activity Tolerance Patient tolerated treatment well   Behavior [facility] for tasks assessed/performed      Past Medical History  Diagnosis Date  . Coronary artery disease   . Hypertension   . Hyperlipidemia     Past Surgical History  Procedure Laterality Date  . Cardiac catheterization    . Coronary angioplasty    . Coronary artery bypass graft      There were no vitals filed for this visit.  Visit Diagnosis:  Parkinson's disease (HCC)  Muscle weakness (generalized)  Lack of coordination  Difficulty walking  Unsteady gait      Subjective Assessment - 10/17/15 2000    Subjective  Patient reports he had a busy weekend, working most of the weekend.  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     Patient Stated Goals Patient reports he wants to walk without stumbling, better balance.     Currently in Pain? No/denies   Pain Score 0-No pain                      OT Treatments/Exercises (OP) - 10/17/15 2002    ADLs   ADL Comments Patient seen for functional component tasks to include sit to stand from a variety of surfaces, squatting to pick up items, stair negotiation, rolling in bed and stepping backwards.   Neurological Re-education Exercises   Other Exercises 1 Patient seen for initial instruction of LSVT BIG exercises: LSVT Daily  Session Maximal Daily Exercises: Sustained movements are designed to rescale the amplitude of movement output for generalization to daily functional activities. Performed as follows for 1 set of 10 repetitions each: Multi directional sustained movements- 1) Floor to ceiling, 2) Side to side. Multi directional Repetitive movements performed in standing and are designed to provide retraining effort needed for sustained muscle activation in tasks Performed as follows: 3) Step and reach forward, 4) Step and Reach Backwards, 5) Step and reach sideways, 6) Rock and reach forward/backward, 7) Rock and reach sideways. Sit to stand from mat table on lowest setting with cues for weight shift, technique and CGA for 5 reps for 2 sets. Patient seen for functional mobility tasks this date with emphasis on gait speed, length of steps with short distance ambulation. Patient requires cues for BIG movements and cadence. All standing exercises performed this date with CGA, verbal and tactile cues as needed   Other Exercises 2 Functional mobility utilizing LSVT BIG techniques for 2 trials of 500 feet, cues for increasing amplitude of steps as well as reciprocal arm swing. Leg press for 10 reps for 2 sets, 120# for increased quad strength to assist with sit to stand. Stair negotiation for 5 steps for 5 trials with cues for cues and reciprocal stepping patterns. Toe taps alternating for  10 reps each, bilaterally.  Balance tasks this date including standing on 1/2 wedge, tandem feet with CGA, alternating right and left foot placement, with and without the use of arms to help balance.                  OT Education - 10/17/15 2005    Education provided Yes   Education Details Exercises, balance   Person(s) Educated Patient   Methods Explanation;Demonstration;Verbal cues   Comprehension Verbal cues required;Returned demonstration;Verbalized understanding             OT Long Term Goals - 09/28/15 1634    OT LONG  TERM GOAL #1   Title Patient will improve gait speed and endurance and be able to walk 1500 feet in 6 minutes to negotiate around the home and community safely in 4 weeks.   Baseline 1315 feet at evaluation   Time 4   Period Weeks   Status New   OT LONG TERM GOAL #2   Title Patient will complete HEP for maximal daily exercises with modified independence in 4 weeks   Baseline no current exercise program for parkinson's   Time 4   Period Weeks   Status New   OT LONG TERM GOAL #3   Title Patient will transfer from sit to stand without the use of arms safely and independently from a variety of chairs/surfaces in 4 weeks.    Baseline difficulty from low surfaces   Time 4   Period Weeks   Status New   OT LONG TERM GOAL #4   Title Patient will decrease frequency of freezing episodes with score of 8 or less on Freezing of Gait Questionnaire    Baseline score of 9 at evaluation.    OT LONG TERM GOAL #5   Title Patient will be modified independent with home and work tasks including squatting to pick up items from the floor.   Time 4   Period Weeks   Status New               Plan - 10/17/15 2006    Clinical Impression Statement Patient reports performing exercises one time a day on Sat and sun.  Discussed the importance of 2x a day exercises on all days, he demos understanding.  Continues to demo issues with balance and complains of mild dizziness at times with turns. Will continue to work towards performing work and home tasks for balance.     Pt will benefit from skilled therapeutic intervention in order to improve on the following deficits (Retired) Abnormal gait;Decreased knowledge of use of DME;Decreased strength;Decreased balance;Decreased mobility;Difficulty walking;Decreased coordination   Rehab Potential Good   OT Frequency 4x / week   OT Duration 4 weeks   OT Treatment/Interventions Self-care/ADL training;Therapeutic exercise;Functional Mobility Training;Patient/family  education;Neuromuscular education;DME and/or AE instruction;Gait Training;Stair Training   Consulted and Agree with Plan of Care Patient        Problem List There are no active problems to display for this patient.  [name], OTR/L, CLT Lovett,Amy 10/18/2015, 8:10 PM  [place] [facility] MAIN [facility] SERVICES [address] [place], [place], [address] Phone: [phone]   Fax:  [phone]  Name: HASEEB FIALLOS MRN: [id number] Date of Birth: [birth year]

## 2015-10-18 NOTE — Therapy (Signed)
[facility] [address] [place], [place], [address] Phone: [phone]   Fax:  [phone]  Occupational Therapy Treatment  Patient Details  Name: Sean Barajas MRN: [id number] Date of Birth: 04/07/1956 No Data Recorded  Encounter Date: 10/18/2015      OT End of Session - 10/18/15 2017    Visit Number 7   Number of Visits 17   Date for OT Re-Evaluation 11/07/15   OT Start Time 1105   OT Stop Time 1208   OT Time Calculation (min) 63 min   Activity Tolerance Patient tolerated treatment well   Behavior [facility] Therapy [facility] for [facility] assessed/performed      Past Medical History  Diagnosis Date  . Coronary artery disease   . Hypertension   . Hyperlipidemia     Past Surgical History  Procedure Laterality Date  . Cardiac catheterization    . Coronary angioplasty    . Coronary artery bypass graft      There were no vitals filed for this visit.  Visit Diagnosis:  Parkinson's disease (HCC)  Muscle weakness (generalized)  Lack of coordination  Difficulty walking  Unsteady gait      Subjective Assessment - 10/18/15 2012    Subjective  Patient reports he did perform another set of exercises last night.  No complaints this date, ready to work and wants to work on some balance tasks this date in addition to his exercises and functional mobility.     Patient Stated Goals Patient reports he wants to walk without stumbling, better balance.     Currently in Pain? No/denies   Pain Score 0-No pain                      OT Treatments/Exercises (OP) - 10/18/15 2013    ADLs   ADL Comments Patient seen for functional component tasks to include sit to stand from a variety of surfaces, squatting to pick up items, stair negotiation, rolling in bed and stepping backwards.  CGA for acts in standing and squatting, cues for rolling in bed.    Neurological Re-education Exercises   Other Exercises 1 Patient  seen for initial instruction of LSVT BIG exercises: LSVT Daily Session Maximal Daily Exercises: Sustained movements are designed to rescale the amplitude of movement output for generalization to daily functional activities. Performed as follows for 1 set of 10 repetitions each: Multi directional sustained movements- 1) Floor to ceiling, 2) Side to side. Multi directional Repetitive movements performed in standing and are designed to provide retraining effort needed for sustained muscle activation in tasks Performed as follows: 3) Step and reach forward, 4) Step and Reach Backwards, 5) Step and reach sideways, 6) Rock and reach forward/backward, 7) Rock and reach sideways. Sit to stand from mat table on lowest setting with cues for weight shift, technique and CGA for 5 reps for 2 sets. Patient seen for functional mobility tasks this date with emphasis on gait speed, length of steps with short distance ambulation. Patient requires cues for BIG movements and cadence. All standing exercises performed this date with CGA, verbal and tactile cues as needed.     Other Exercises 2 Functional mobility utilizing LSVT BIG techniques for 3 trials of 300 feet, cues for increasing amplitude of steps as well as reciprocal arm swing.. Added leg press for 10 reps for 2 sets, 120# for increased quad strength to assist with sit to stand. Stair negotiation for 5 steps for  5 trials with cues for cues and reciprocal stepping patterns. Toe taps alternating for 10 reps each, bilaterally.  Balance activities with functional mobility to weave in and out of objects placed on floor, obstacle course setup with step over items placed strategically.                  OT Education - 10/18/15 2017    Education provided Yes   Education Details LSVT BIG exercises, maximal daily exercises   Person(s) Educated Patient   Methods Explanation;Demonstration;Verbal cues   Comprehension Verbal cues required;Returned demonstration;Verbalized  understanding             OT Long Term Goals - 09/28/15 1634    OT LONG TERM GOAL #1   Title Patient will improve gait speed and endurance and be able to walk 1500 feet in 6 minutes to negotiate around the home and community safely in 4 weeks.   Baseline 1315 feet at evaluation   Time 4   Period Weeks   Status New   OT LONG TERM GOAL #2   Title Patient will complete HEP for maximal daily exercises with modified independence in 4 weeks   Baseline no current exercise program for parkinson's   Time 4   Period Weeks   Status New   OT LONG TERM GOAL #3   Title Patient will transfer from sit to stand without the use of arms safely and independently from a variety of chairs/surfaces in 4 weeks.    Baseline difficulty from low surfaces   Time 4   Period Weeks   Status New   OT LONG TERM GOAL #4   Title Patient will decrease frequency of freezing episodes with score of 8 or less on Freezing of Gait Questionnaire    Baseline score of 9 at evaluation.    OT LONG TERM GOAL #5   Title Patient will be modified independent with home and work tasks including squatting to pick up items from the floor.   Time 4   Period Weeks   Status New               Plan - 10/18/15 2018    Clinical Impression Statement Continued progress in all areas.  Decreased cues with exercises this date but is still challenged by stepping backwards. CGA for stepping backwards and SBA for other standing exercises.  Continue to focus on balance tasks, functional mobility with emphasis on amplitude of steps and performance of daily exercises as per protocol.    Pt will benefit from skilled therapeutic intervention in order to improve on the following deficits (Retired) Abnormal gait;Decreased knowledge of use of DME;Decreased strength;Decreased balance;Decreased mobility;Difficulty walking;Decreased coordination   Rehab Potential Good   OT Frequency 4x / week   OT Duration 4 weeks   OT Treatment/Interventions  Self-care/ADL training;Therapeutic exercise;Functional Mobility Training;Patient/family education;Neuromuscular education;DME and/or AE instruction;Gait Training;Stair Training   Consulted and Agree with Plan of Care Patient        Problem List There are no active problems to display for this patient.  [name], OTR/L, CLT  Lovett,Amy 10/18/2015, 8:21 PM  [place] [facility] MAIN [facility] SERVICES [address] [place], [place], [address] Phone: [phone]   Fax:  [phone]  Name: Sean Barajas MRN: [id number] Date of Birth: [birth year]

## 2015-10-19 ENCOUNTER — Ambulatory Visit: Payer: BLUE CROSS/BLUE SHIELD | Admitting: Occupational Therapy

## 2015-10-19 DIAGNOSIS — G20 Parkinson's disease: Secondary | ICD-10-CM | POA: Diagnosis not present

## 2015-10-19 DIAGNOSIS — R262 Difficulty in walking, not elsewhere classified: Secondary | ICD-10-CM

## 2015-10-19 DIAGNOSIS — R2681 Unsteadiness on feet: Secondary | ICD-10-CM

## 2015-10-19 DIAGNOSIS — M6281 Muscle weakness (generalized): Secondary | ICD-10-CM

## 2015-10-19 DIAGNOSIS — R279 Unspecified lack of coordination: Secondary | ICD-10-CM

## 2015-10-20 ENCOUNTER — Ambulatory Visit: Payer: BLUE CROSS/BLUE SHIELD | Admitting: Occupational Therapy

## 2015-10-20 ENCOUNTER — Encounter: Payer: Self-pay | Admitting: Occupational Therapy

## 2015-10-20 DIAGNOSIS — G20 Parkinson's disease: Secondary | ICD-10-CM | POA: Diagnosis not present

## 2015-10-20 DIAGNOSIS — R279 Unspecified lack of coordination: Secondary | ICD-10-CM

## 2015-10-20 DIAGNOSIS — R262 Difficulty in walking, not elsewhere classified: Secondary | ICD-10-CM

## 2015-10-20 DIAGNOSIS — R2681 Unsteadiness on feet: Secondary | ICD-10-CM

## 2015-10-20 DIAGNOSIS — M6281 Muscle weakness (generalized): Secondary | ICD-10-CM

## 2015-10-20 NOTE — Therapy (Signed)
[place] [facility] [address] [place], [place], [address] Phone: [phone]   Fax:  [phone]  Occupational Therapy Treatment  Patient Details  Name: Sean Barajas MRN: [id number] Date of Birth: 04/02/1956 No Data Recorded  Encounter Date: 10/19/2015      OT End of Session - 10/19/15 1644    Visit Number 8   Number of Visits 17   Date for OT Re-Evaluation 11/07/15   OT Start Time 1106   OT Stop Time 1205   OT Time Calculation (min) 59 min   Activity Tolerance Patient tolerated treatment well   Behavior During Therapy [facility] for tasks assessed/performed      Past Medical History  Diagnosis Date  . Coronary artery disease   . Hypertension   . Hyperlipidemia     Past Surgical History  Procedure Laterality Date  . Cardiac catheterization    . Coronary angioplasty    . Coronary artery bypass graft      There were no vitals filed for this visit.  Visit Diagnosis:  Parkinson's disease (HCC)  Muscle weakness (generalized)  Lack of coordination  Difficulty walking  Unsteady gait      Subjective Assessment - 10/19/15 1642    Subjective  Patient reports he is doing better with his exercises but still has some trouble at times with stepping backwards exercise and balance at times. Did his exercises 2 times yesterday.   Patient Stated Goals Patient reports he wants to walk without stumbling, better balance.     Currently in Pain? No/denies   Pain Score 0-No pain                      OT Treatments/Exercises (OP) - 10/19/15 1647    ADLs   ADL Comments Patient seen for functional component tasks to include sit to stand from a variety of surfaces, squatting to pick up items, stair negotiation, rolling in bed and stepping backwards. CGA for acts in standing and squatting, cues for rolling in bed.Simulated work tasks for squatting and holding position to manage using tools.   Neurological  Re-education Exercises   Other Exercises 1 Patient seen for initial instruction of LSVT BIG exercises: LSVT Daily Session Maximal Daily Exercises: Sustained movements are designed to rescale the amplitude of movement output for generalization to daily functional activities. Performed as follows for 1 set of 10 repetitions each: Multi directional sustained movements- 1) Floor to ceiling, 2) Side to side. Multi directional Repetitive movements performed in standing and are designed to provide retraining effort needed for sustained muscle activation in tasks Performed as follows: 3) Step and reach forward, 4) Step and Reach Backwards, 5) Step and reach sideways, 6) Rock and reach forward/backward, 7) Rock and reach sideways. Sit to stand from mat table on lowest setting with cues for weight shift, technique for 10 reps. Patient seen for functional mobility tasks this date with emphasis on gait speed, length of steps with short distance ambulation. Patient requires cues for BIG movements and cadence. All standing exercises performed this date with SBA except stepping backwards which required CGA, verbal and tactile cues as needed.    Other Exercises 2 Functional mobility utilizing LSVT BIG techniques for 3 trials of 400 feet, cues for increasing amplitude of steps as well as reciprocal arm swing. Leg press for 10 reps for 2 sets, 130# for increased quad strength to assist with sit to stand. Stair negotiation for 5 steps for 5  trials with cues for cues and reciprocal stepping patterns. Toe taps alternating for 10 reps each, bilaterally. Balance activities with BOSU ball utilizing both side for standing on uneven surface with balance challenges, with and without arm involvement, added mini squats with CGA to minimal assist for balance.  Attempted trials of stepping backwards off of BOSU ball with min to CGA for balance since this is a movement which tends to result in patient imbalance.                OT  Education - 10/19/15 1644    Education provided Yes   Education Details LSVT BIG maximal daily exercises, balance tasks, safety with squatting.   Person(s) Educated Patient   Methods Explanation;Demonstration;Verbal cues   Comprehension Verbal cues required;Returned demonstration;Verbalized understanding             OT Long Term Goals - 09/28/15 1634    OT LONG TERM GOAL #1   Title Patient will improve gait speed and endurance and be able to walk 1500 feet in 6 minutes to negotiate around the home and community safely in 4 weeks.   Baseline 1315 feet at evaluation   Time 4   Period Weeks   Status New   OT LONG TERM GOAL #2   Title Patient will complete HEP for maximal daily exercises with modified independence in 4 weeks   Baseline no current exercise program for parkinson's   Time 4   Period Weeks   Status New   OT LONG TERM GOAL #3   Title Patient will transfer from sit to stand without the use of arms safely and independently from a variety of chairs/surfaces in 4 weeks.    Baseline difficulty from low surfaces   Time 4   Period Weeks   Status New   OT LONG TERM GOAL #4   Title Patient will decrease frequency of freezing episodes with score of 8 or less on Freezing of Gait Questionnaire    Baseline score of 9 at evaluation.    OT LONG TERM GOAL #5   Title Patient will be modified independent with home and work tasks including squatting to pick up items from the floor.   Time 4   Period Weeks   Status New               Plan - 10/19/15 1645    Clinical Impression Statement Patient continues to benefit from skilled OT for LSVT BIG program with focus on amplitude of gait, maximal daily exercises, functional component tasks and balance tasks to improve independence and performance of tasks at home and at work.  No falls reported since starting program.  Will continue to advance exercises and challenge balance in conjunction to simulated work tasks.    Pt will  benefit from skilled therapeutic intervention in order to improve on the following deficits (Retired) Abnormal gait;Decreased knowledge of use of DME;Decreased strength;Decreased balance;Decreased mobility;Difficulty walking;Decreased coordination   Rehab Potential Good   OT Frequency 4x / week   OT Duration 4 weeks   OT Treatment/Interventions Self-care/ADL training;Therapeutic exercise;Functional Mobility Training;Patient/family education;Neuromuscular education;DME and/or AE instruction;Gait Training;Stair Training   Consulted and Agree with Plan of Care Patient        Problem List There are no active problems to display for this patient.  Sean Barajas, OTR/L, CLT  Sean Barajas,Sean Barajas 10/20/2015, 7:13 PM  [place] [facility] MAIN [facility] SERVICES [address] [place], [place], [address] Phone: [phone]  Fax:  [phone]  Name: Sean Barajas MRN: [id number] Date of Birth: [birth year]

## 2015-10-21 NOTE — Therapy (Signed)
[facility] MAIN [facility] SERVICES [address] [place], [place], [address] Phone: [phone]   Fax:  [phone]  Occupational Therapy Treatment  Patient Details  Name: Sean Barajas MRN: [id number] Date of Birth: March 03, 1956 No Data Recorded  Encounter Date: 10/20/2015      OT End of Session - 10/20/15 1557    Visit Number 9   Number of Visits 17   Date for OT Re-Evaluation 11/07/15   OT Start Time 1107   OT Stop Time 1210   OT Time Calculation (min) 63 min   Activity Tolerance Patient tolerated treatment well   Behavior During Therapy [facility] for tasks assessed/performed      Past Medical History  Diagnosis Date  . Coronary artery disease   . Hypertension   . Hyperlipidemia     Past Surgical History  Procedure Laterality Date  . Cardiac catheterization    . Coronary angioplasty    . Coronary artery bypass graft      There were no vitals filed for this visit.  Visit Diagnosis:  Parkinson's disease (HCC)  Muscle weakness (generalized)  Lack of coordination  Difficulty walking  Unsteady gait      Subjective Assessment - 10/20/15 1552    Subjective  Patient reports he is tired but has been trying to keep up with exercises.     Patient Stated Goals Patient reports he wants to walk without stumbling, better balance.     Currently in Pain? No/denies   Pain Score 0-No pain   Multiple Pain Sites No                      OT Treatments/Exercises (OP) - 10/20/15 1553    ADLs   ADL Comments Patient seen for functional component tasks to include sit to stand from a variety of surfaces, squatting to pick up items, stair negotiation, rolling in bed and stepping backwards. CGA for acts in standing and squatting, cues for rolling in bed.Simulated work tasks for squatting and holding position to manage using tools.  Squatting for sustained time of 20-30 seconds for work tasks.   Neurological Re-education Exercises   Other Exercises 1 Patient seen for initial instruction of LSVT BIG exercises: LSVT Daily Session Maximal Daily Exercises: Sustained movements are designed to rescale the amplitude of movement output for generalization to daily functional activities. Performed as follows for 1 set of 10 repetitions each: Multi directional sustained movements- 1) Floor to ceiling, 2) Side to side. Multi directional Repetitive movements performed in standing and are designed to provide retraining effort needed for sustained muscle activation in tasks Performed as follows: 3) Step and reach forward, 4) Step and Reach Backwards, 5) Step and reach sideways, 6) Rock and reach forward/backward, 7) Rock and reach sideways. Sit to stand from mat table on lowest setting with cues for weight shift, technique for 10 reps. Patient seen for functional mobility tasks this date with emphasis on gait speed, length of steps with short distance ambulation. Patient requires cues for BIG movements and cadence. All standing exercises performed this date with SBA except stepping backwards which required CGA, verbal and tactile cues as needed.    Other Exercises 2 .  Step over balance tasks with BOSU ball turned upside down with CGA for balance.  Performed on both right and left sides.                OT Education - 10/20/15 1555    Education  provided Yes   Education Details Patient aware he needs to focus on maximal daily exercises 2 times a day for the next 3 days as well as functional mobility despite going out of town this weekend.   Person(s) Educated Patient   Methods Explanation;Demonstration;Verbal cues   Comprehension Verbal cues required;Returned demonstration;Verbalized understanding             OT Long Term Goals - 09/28/15 1634    OT LONG TERM GOAL #1   Title Patient will improve gait speed and endurance and be able to walk 1500 feet in 6 minutes to negotiate around the home and community safely in 4 weeks.    Baseline 1315 feet at evaluation   Time 4   Period Weeks   Status New   OT LONG TERM GOAL #2   Title Patient will complete HEP for maximal daily exercises with modified independence in 4 weeks   Baseline no current exercise program for parkinson's   Time 4   Period Weeks   Status New   OT LONG TERM GOAL #3   Title Patient will transfer from sit to stand without the use of arms safely and independently from a variety of chairs/surfaces in 4 weeks.    Baseline difficulty from low surfaces   Time 4   Period Weeks   Status New   OT LONG TERM GOAL #4   Title Patient will decrease frequency of freezing episodes with score of 8 or less on Freezing of Gait Questionnaire    Baseline score of 9 at evaluation.    OT LONG TERM GOAL #5   Title Patient will be modified independent with home and work tasks including squatting to pick up items from the floor.   Time 4   Period Weeks   Status New               Plan - 10/20/15 1558    Clinical Impression Statement Patient is going out of town this weekend to the beach with a friend.  He realizes he needs to perform exercises and walking while he is gone.  Recommended he have his friend with him when walking on the beach in the sand secondary to balance issues in loose sand areas.  Patient is becoming more proficient at performing maximal daily exercises, struggles slightly still with stepping backwards.  Focused on this area today with use of BOSU ball  with step overs and step on and off backwards.  Patient occasionally hitting his toe on the step or when stepping up onto BOSU ball despite repeated cues for increasing the amplitude of his step with these tasks.    Pt will benefit from skilled therapeutic intervention in order to improve on the following deficits (Retired) Abnormal gait;Decreased knowledge of use of DME;Decreased strength;Decreased balance;Decreased mobility;Difficulty walking;Decreased coordination   Rehab Potential Good   OT  Frequency 4x / week   OT Duration 4 weeks   OT Treatment/Interventions Self-care/ADL training;Therapeutic exercise;Functional Mobility Training;Patient/family education;Neuromuscular education;DME and/or AE instruction;Gait Training;Stair Training   Consulted and Agree with Plan of Care Patient        Problem List There are no active problems to display for this patient.  Kerrie Buffalomy T Alyria Krack, OTR/L, CLT  Renatha Rosen 10/21/2015, 4:01 PM  [place] [facility] [address] [place], [place], [address] Phone: [phone]   Fax:  [phone]  Name: Sean Barajas MRN: [id number] Date of Birth: 11/01/1956

## 2015-10-24 ENCOUNTER — Ambulatory Visit: Payer: BLUE CROSS/BLUE SHIELD | Admitting: Occupational Therapy

## 2015-10-24 ENCOUNTER — Encounter: Payer: Self-pay | Admitting: Occupational Therapy

## 2015-10-24 DIAGNOSIS — R279 Unspecified lack of coordination: Secondary | ICD-10-CM

## 2015-10-24 DIAGNOSIS — R2681 Unsteadiness on feet: Secondary | ICD-10-CM

## 2015-10-24 DIAGNOSIS — R262 Difficulty in walking, not elsewhere classified: Secondary | ICD-10-CM

## 2015-10-24 DIAGNOSIS — M6281 Muscle weakness (generalized): Secondary | ICD-10-CM

## 2015-10-24 DIAGNOSIS — G20 Parkinson's disease: Secondary | ICD-10-CM

## 2015-10-25 ENCOUNTER — Ambulatory Visit: Payer: BLUE CROSS/BLUE SHIELD | Admitting: Occupational Therapy

## 2015-10-25 ENCOUNTER — Encounter: Payer: Self-pay | Admitting: Occupational Therapy

## 2015-10-25 DIAGNOSIS — M6281 Muscle weakness (generalized): Secondary | ICD-10-CM

## 2015-10-25 DIAGNOSIS — R279 Unspecified lack of coordination: Secondary | ICD-10-CM

## 2015-10-25 DIAGNOSIS — G20 Parkinson's disease: Secondary | ICD-10-CM | POA: Diagnosis not present

## 2015-10-25 DIAGNOSIS — R262 Difficulty in walking, not elsewhere classified: Secondary | ICD-10-CM

## 2015-10-25 DIAGNOSIS — R2681 Unsteadiness on feet: Secondary | ICD-10-CM

## 2015-10-25 NOTE — Therapy (Signed)
[place] [facility] MAIN [facility] SERVICES [address] [place], [place], [address] Phone: [phone]   Fax:  [phone]  Occupational Therapy Treatment  Patient Details  Name: Sean Barajas MRN: [id number] Date of Birth: 1956/05/20 No Data Recorded  Encounter Date: 10/24/2015      OT End of Session - 10/24/15 1639    Visit Number 10   Number of Visits 17   Date for OT Re-Evaluation 11/07/15   OT Start Time 1110   OT Stop Time 1216   OT Time Calculation (min) 66 min   Activity Tolerance Patient tolerated treatment well   Behavior During Therapy [facility] for tasks assessed/performed      Past Medical History  Diagnosis Date  . Coronary artery disease   . Hypertension   . Hyperlipidemia     Past Surgical History  Procedure Laterality Date  . Cardiac catheterization    . Coronary angioplasty    . Coronary artery bypass graft      There were no vitals filed for this visit.  Visit Diagnosis:  Parkinson's disease (HCC)  Muscle weakness (generalized)  Lack of coordination  Difficulty walking  Unsteady gait      Subjective Assessment - 10/24/15 1637    Subjective  Patient reports he did exercises over the weekend and practiced on stepping backwards.  Also did some walking at the beach but reported it was somewhat difficult due to the tracks in the sand from vehicles driving on the beach.     Patient Stated Goals Patient reports he wants to walk without stumbling, better balance.     Currently in Pain? No/denies   Pain Score 0-No pain                      OT Treatments/Exercises (OP) - 10/24/15 1854    ADLs   ADL Comments Patient seen for functional component tasks to include sit to stand from a variety of surfaces, squatting to pick up items, stair negotiation, rolling in bed and stepping backwards. CGA for acts in standing and squatting, cues for rolling in bed.Simulated work tasks for squatting and holding position  to manage using tools.    Neurological Re-education Exercises   Other Exercises 1 Patient seen for initial instruction of LSVT BIG exercises: LSVT Daily Session Maximal Daily Exercises: Sustained movements are designed to rescale the amplitude of movement output for generalization to daily functional activities. Performed as follows for 1 set of 10 repetitions each: Multi directional sustained movements- 1) Floor to ceiling, 2) Side to side. Multi directional Repetitive movements performed in standing and are designed to provide retraining effort needed for sustained muscle activation in tasks Performed as follows: 3) Step and reach forward, 4) Step and Reach Backwards, 5) Step and reach sideways, 6) Rock and reach forward/backward, 7) Rock and reach sideways. Sit to stand from mat table on lowest setting with cues for weight shift, technique for 10 reps. Patient seen for functional mobility tasks this date with emphasis on gait speed, length of steps with short distance ambulation. Patient requires cues for BIG movements and cadence. All standing exercises performed this date with SBA except stepping backwards which required CGA, verbal and tactile cues as needed   Other Exercises 2 6 minute walk test this date, 1400 feet.                  OT Education - 10/24/15 1639    Education provided Yes  Education Details HEP, LSVT BIG exercises, review of 6 minute walk test.   Person(s) Educated Patient   Methods Explanation;Demonstration;Verbal cues   Comprehension Verbal cues required;Returned demonstration;Verbalized understanding             OT Long Term Goals - 10/25/15 1856    OT LONG TERM GOAL #1   Title Patient will improve gait speed and endurance and be able to walk 1500 feet in 6 minutes to negotiate around the home and community safely in 4 weeks.   Baseline 1315 feet at evaluation, 1400 at 10th visit   Time 4   Period Weeks   Status On-going   OT LONG TERM GOAL #2   Title  Patient will complete HEP for maximal daily exercises with modified independence in 4 weeks   Time 4   Period Weeks   Status Partially Met   OT LONG TERM GOAL #3   Title Patient will transfer from sit to stand without the use of arms safely and independently from a variety of chairs/surfaces in 4 weeks.    Baseline difficulty from low surfaces   Time 4   Period Weeks   Status On-going   OT LONG TERM GOAL #4   Title Patient will decrease frequency of freezing episodes with score of 8 or less on Freezing of Gait Questionnaire    Baseline score of 9 at evaluation.    Time 4   Period Weeks   Status On-going   OT LONG TERM GOAL #5   Title Patient will be modified independent with home and work tasks including squatting to pick up items from the floor.   Time 4   Period Weeks   Status On-going               Plan - 10/24/15 1640    Clinical Impression Statement Patient seen for reassessment of 6 minute walk this date, completed 1400 feet versus 1315 at evaluation.  Patient is becoming more proficient with performing exercises correctly as well as improving performance of functional component tasks.  Still has some mild dizziness with quick head turns during select exercises.  Continuing to focus on stepping backwards and balance related to these movements.     Pt will benefit from skilled therapeutic intervention in order to improve on the following deficits (Retired) Abnormal gait;Decreased knowledge of use of DME;Decreased strength;Decreased balance;Decreased mobility;Difficulty walking;Decreased coordination   Rehab Potential Good   OT Frequency 4x / week   OT Duration 4 weeks   OT Treatment/Interventions Self-care/ADL training;Therapeutic exercise;Functional Mobility Training;Patient/family education;Neuromuscular education;DME and/or AE instruction;Gait Training;Stair Training   Consulted and Agree with Plan of Care Patient        Problem List There are no active problems  to display for this patient.  Achilles Dunk, OTR/L, CLT Sean Barajas 10/25/2015, 6:57 PM  [place] MAIN [facility] SERVICES [address] [place], [place], [address] Phone: [phone]   Fax:  [phone]  Name: Sean Barajas MRN: [id number] Date of Birth: [birth year]

## 2015-10-26 ENCOUNTER — Encounter: Payer: Self-pay | Admitting: Occupational Therapy

## 2015-10-26 ENCOUNTER — Ambulatory Visit: Payer: BLUE CROSS/BLUE SHIELD | Admitting: Occupational Therapy

## 2015-10-26 DIAGNOSIS — G20 Parkinson's disease: Secondary | ICD-10-CM | POA: Diagnosis not present

## 2015-10-26 DIAGNOSIS — R279 Unspecified lack of coordination: Secondary | ICD-10-CM

## 2015-10-26 DIAGNOSIS — R262 Difficulty in walking, not elsewhere classified: Secondary | ICD-10-CM

## 2015-10-26 DIAGNOSIS — R2681 Unsteadiness on feet: Secondary | ICD-10-CM

## 2015-10-26 DIAGNOSIS — G20A1 Parkinson's disease without dyskinesia, without mention of fluctuations: Secondary | ICD-10-CM

## 2015-10-26 DIAGNOSIS — M6281 Muscle weakness (generalized): Secondary | ICD-10-CM

## 2015-10-26 NOTE — Therapy (Signed)
[name] MAIN [facility] SERVICES [address] [place], [place], [address] Phone: [phone]   Fax:  [phone]  Occupational Therapy Treatment  Patient Details  Name: Sean Barajas MRN: [id number] Date of Birth: May 04, 1956 No Data Recorded  Encounter Date: 10/26/2015      OT End of Session - 10/26/15 1453    Visit Number 12   Number of Visits 17   Date for OT Re-Evaluation 11/07/15   OT Start Time 1104   OT Stop Time 1210   OT Time Calculation (min) 66 min   Activity Tolerance Patient tolerated treatment well   Behavior During Therapy Sean Barajas for tasks assessed/performed      Past Medical History  Diagnosis Date  . Coronary artery disease   . Hypertension   . Hyperlipidemia     Past Surgical History  Procedure Laterality Date  . Cardiac catheterization    . Coronary angioplasty    . Coronary artery bypass graft      There were no vitals filed for this visit.  Visit Diagnosis:  Parkinson's disease ([place])  Muscle weakness (generalized)  Lack of coordination  Difficulty walking  Unsteady gait      Subjective Assessment - 10/26/15 1453    Subjective  Patient reports his family is in town and he is going to spend some time with his sister this afternoon but will be busy with work until late in the evening due to all the holiday traffic.     Patient Stated Goals Patient reports he wants to walk without stumbling, better balance.     Currently in Pain? No/denies   Pain Score 0-No pain                      OT Treatments/Exercises (OP) - 10/26/15 1529    ADLs   ADL Comments Patient seen for functional component tasks to include sit to stand from a variety of surfaces, squatting to pick up items, stair negotiation, rolling in bed and stepping backwards. CGA for acts in standing and squatting, cues for rolling in bed.Simulated work tasks for squatting and holding position to manage using tools.    Neurological  Re-education Exercises   Other Exercises 1 Patient seen for initial instruction of LSVT BIG exercises: LSVT Daily Session Maximal Daily Exercises: Sustained movements are designed to rescale the amplitude of movement output for generalization to daily functional activities. Performed as follows for 1 set of 10 repetitions each: Multi directional sustained movements- 1) Floor to ceiling, 2) Side to side. Multi directional Repetitive movements performed in standing and are designed to provide retraining effort needed for sustained muscle activation in tasks Performed as follows: 3) Step and reach forward, 4) Step and Reach Backwards, 5) Step and reach sideways, 6) Rock and reach forward/backward, 7) Rock and reach sideways. Sit to stand from mat table on lowest setting with cues for weight shift, technique for 10 reps. Patient seen for functional mobility tasks this date with emphasis on gait speed, length of steps with short distance ambulation. Patient requires cues for BIG movements and cadence. All standing exercises performed this date with SBA except stepping backwards which required CGA, verbal and tactile cues as needed   Other Exercises 2 Functional mobility with one trial of 600 feet without rest breaks this date, cues for amplitude of gait and occasional cues for reciprocal arm swing. Patient becoming more aware of step lengths and self correcting with decreased cues  Additional focus  on balance with heel to toe walking on balance beam in parallel bars with CGA.  Performed single leg stance on air disk for each lower extremity holding for up to 5 secs with CGA and cues.                OT Education - 10/26/15 1453    Education provided Yes   Education Details balance, safety, work tasks, Production manager) Educated Patient   Methods Explanation;Demonstration;Verbal cues   Comprehension Verbalized understanding;Returned demonstration;Verbal cues required             OT Long Term Goals  - 10/25/15 1856    OT LONG TERM GOAL #1   Title Patient will improve gait speed and endurance and be able to walk 1500 feet in 6 minutes to negotiate around the home and community safely in 4 weeks.   Baseline 1315 feet at evaluation, 1400 at 10th visit   Time 4   Period Weeks   Status On-going   OT LONG TERM GOAL #2   Title Patient will complete HEP for maximal daily exercises with modified independence in 4 weeks   Time 4   Period Weeks   Status Partially Met   OT LONG TERM GOAL #3   Title Patient will transfer from sit to stand without the use of arms safely and independently from a variety of chairs/surfaces in 4 weeks.    Baseline difficulty from low surfaces   Time 4   Period Weeks   Status On-going   OT LONG TERM GOAL #4   Title Patient will decrease frequency of freezing episodes with score of 8 or less on Freezing of Gait Questionnaire    Baseline score of 9 at evaluation.    Time 4   Period Weeks   Status On-going   OT LONG TERM GOAL #5   Title Patient will be modified independent with home and work tasks including squatting to pick up items from the floor.   Time 4   Period Weeks   Status On-going               Plan - 10/26/15 1454    Clinical Impression Statement Patient reports he feels better today and got some rest but did complain of decreased balance this week with work tasks.  Patient demonstrated some swaying behaviors and tends to lean more posterior, appearing to almost lose his balance but was able to self recover.     Pt will benefit from skilled therapeutic intervention in order to improve on the following deficits (Retired) Abnormal gait;Decreased knowledge of use of DME;Decreased strength;Decreased balance;Decreased mobility;Difficulty walking;Decreased coordination   Rehab Potential Good   OT Frequency 4x / week   OT Duration 4 weeks   OT Treatment/Interventions Self-care/ADL training;Therapeutic exercise;Functional Mobility  Training;Patient/family education;Neuromuscular education;DME and/or AE instruction;Gait Training;Stair Training   Consulted and Agree with Plan of Care Patient        Problem List There are no active problems to display for this patient.  Achilles Dunk, OTR/L, CLT Lovett,Amy 10/26/2015, 3:34 PM  [place] MAIN [facility] SERVICES [address] [place], [place], [address] Phone: [phone]   Fax:  [phone]  Name: Sean Barajas MRN: [id number] Date of Birth: 1956-07-24

## 2015-10-26 NOTE — Therapy (Signed)
[place] MAIN [facility] SERVICES [address] [place], [place], [address] Phone: [phone]   Fax:  [phone]  Occupational Therapy Treatment  Patient Details  Name: Sean Barajas MRN: [id number] Date of Birth: 1956/04/19 No Data Recorded  Encounter Date: 10/25/2015      OT End of Session - 10/25/15 2034    Visit Number 11   Number of Visits 17   Date for OT Re-Evaluation 11/07/15   OT Start Time 1103   OT Stop Time 1206   OT Time Calculation (min) 63 min   Activity Tolerance Patient tolerated treatment well   Behavior During Therapy [facility] for tasks assessed/performed      Past Medical History  Diagnosis Date  . Coronary artery disease   . Hypertension   . Hyperlipidemia     Past Surgical History  Procedure Laterality Date  . Cardiac catheterization    . Coronary angioplasty    . Coronary artery bypass graft      There were no vitals filed for this visit.  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  Tired today but willing to do what he can.  States, "I may have to try to take off a couple hours in the afternoon and get a nap."     Patient Stated Goals Patient reports he wants to walk without stumbling, better balance.     Currently in Pain? No/denies   Pain Score 0-No pain                      OT Treatments/Exercises (OP) - 10/25/15 1034    ADLs   ADL Comments Patient seen for functional component tasks to include sit to stand from a variety of surfaces, squatting to pick up items, stair negotiation, rolling in bed and stepping backwards. CGA for acts in standing and squatting, cues for rolling in bed.Simulated work tasks for  squatting and holding position to manage using tools.    Neurological Re-education Exercises   Other Exercises 1 Patient seen for initial instruction of LSVT BIG exercises: LSVT Daily Session Maximal Daily Exercises: Sustained movements are designed to rescale the amplitude of movement output for generalization to daily functional activities. Performed as follows for 1 set of 10 repetitions each: Multi directional sustained movements- 1) Floor to ceiling, 2) Side to side. Multi directional Repetitive movements performed in standing and are designed to provide retraining effort needed for sustained muscle activation in tasks Performed as follows: 3) Step and reach forward, 4) Step and Reach Backwards, 5) Step and reach sideways, 6) Rock and reach forward/backward, 7) Rock and reach sideways. Sit to stand from mat table on lowest setting with cues for weight shift, technique for 10 reps. Patient seen for functional mobility tasks this date with emphasis on gait speed, length of steps with short distance ambulation. Patient requires cues for BIG movements and cadence. All standing exercises performed this date with SBA except stepping backwards which required CGA, verbal and tactile cues as needed   Other Exercises 2 Functional mobility with one trial of 800 feet without rest breaks this date, cues for amplitude of gait and occasional cues for reciprocal arm swing.  Patient becoming more aware of step lengths and self correcting with decreased cues.                  OT Education - 10/25/15 2034    Education provided Yes   Person(s) Educated Patient   Methods Explanation;Demonstration;Verbal cues   Comprehension Verbal cues required;Returned demonstration;Verbalized understanding             OT Long Term Goals - 10/25/15 1856    OT LONG TERM GOAL #1   Title Patient will improve gait speed and endurance and be able to walk 1500 feet in 6 minutes to negotiate around the home and community  safely in 4 weeks.   Baseline 1315 feet at evaluation, 1400 at 10th visit   Time 4   Period Weeks   Status On-going   OT LONG TERM GOAL #2   Title Patient will complete HEP for maximal daily exercises with modified independence in 4 weeks   Time 4   Period Weeks   Status Partially Met   OT LONG TERM GOAL #3   Title Patient will transfer from sit to stand without the use of arms safely and independently from a variety of chairs/surfaces in 4 weeks.    Baseline difficulty from low surfaces   Time 4   Period Weeks   Status On-going   OT LONG TERM GOAL #4   Title Patient will decrease frequency of freezing episodes with score of 8 or less on Freezing of Gait Questionnaire    Baseline score of 9 at evaluation.    Time 4   Period Weeks   Status On-going   OT LONG TERM GOAL #5   Title Patient will be modified independent with home and work tasks including squatting to pick up items from the floor.   Time 4   Period Weeks   Status On-going               Plan - 10/25/15 2035    Clinical Impression Statement Patient fatigued this date and required increased cues with stair negotiation with size of step to clear next level.  Right toe bumping the edge of the step on 3 occasions despite cues.  when questioned patient on the error, he was able to recognize it verbally but had difficulty translating into correction of movement with subsequent trials.  Fatigue may have been a factor this date.  Continued focus on balance tasks in standing and squatting to perform select tasks.  Patient did have a near miss fall at home, falling backwards, reached out to the TV but was able to self recover before falling or pulling TV off table.     Pt will benefit from skilled therapeutic intervention in order to improve on the following deficits (Retired) Abnormal gait;Decreased knowledge of use of DME;Decreased strength;Decreased balance;Decreased mobility;Difficulty walking;Decreased coordination   Rehab  Potential Good   OT Frequency 4x / week   OT Duration 4 weeks   OT Treatment/Interventions Self-care/ADL training;Therapeutic exercise;Functional Mobility Training;Patient/family education;Neuromuscular education;DME and/or AE instruction;Gait Training;Stair Training   Consulted and Agree with Plan of Care Patient        Problem List There are no active problems to display for this patient.  Achilles Dunk, OTR/L, CLT  Lovett,Amy 10/26/2015, 10:40 AM  [place] MAIN [facility] SERVICES [address] [place], [place], [address] Phone: [phone]   Fax:  [phone]  Name: Sean Barajas MRN: [id number] Date of Birth: 09-29-1956

## 2015-10-31 ENCOUNTER — Encounter: Payer: Self-pay | Admitting: Occupational Therapy

## 2015-10-31 ENCOUNTER — Ambulatory Visit: Payer: BLUE CROSS/BLUE SHIELD | Admitting: Occupational Therapy

## 2015-10-31 DIAGNOSIS — M6281 Muscle weakness (generalized): Secondary | ICD-10-CM

## 2015-10-31 DIAGNOSIS — R262 Difficulty in walking, not elsewhere classified: Secondary | ICD-10-CM

## 2015-10-31 DIAGNOSIS — G20A1 Parkinson's disease without dyskinesia, without mention of fluctuations: Secondary | ICD-10-CM

## 2015-10-31 DIAGNOSIS — G20 Parkinson's disease: Secondary | ICD-10-CM | POA: Diagnosis not present

## 2015-10-31 DIAGNOSIS — R279 Unspecified lack of coordination: Secondary | ICD-10-CM

## 2015-10-31 DIAGNOSIS — R2681 Unsteadiness on feet: Secondary | ICD-10-CM

## 2015-10-31 NOTE — Therapy (Signed)
[place] MAIN [facility] SERVICES [address] [place], [place], [address] Phone: [phone]   Fax:  [phone]  Occupational Therapy Treatment  Patient Details  Name: Sean Barajas MRN: [id number] Date of Birth: August 04, 1956 No Data Recorded  Encounter Date: 10/31/2015      OT End of Session - 10/31/15 1611    Visit Number 13   Number of Visits 17   Date for OT Re-Evaluation 11/07/15   OT Start Time 1103   OT Stop Time 1204   OT Time Calculation (min) 61 min   Activity Tolerance Patient tolerated treatment well   Behavior During Therapy [facility] for tasks assessed/performed      Past Medical History  Diagnosis Date  . Coronary artery disease   . Hypertension   . Hyperlipidemia     Past Surgical History  Procedure Laterality Date  . Cardiac catheterization    . Coronary angioplasty    . Coronary artery bypass graft      There were no vitals filed for this visit.  Visit Diagnosis:  Parkinson's disease ([place])  Muscle weakness (generalized)  Lack of coordination  Difficulty walking  Unsteady gait      Subjective Assessment - 10/31/15 1610    Subjective  Patient reports he had a nice Thanksgiving holiday with his family, reports he did exercises over the weekend.     Patient Stated Goals Patient reports he wants to walk without stumbling, better balance.     Currently in Pain? No/denies   Pain Score 0-No pain   Multiple Pain Sites No                      OT Treatments/Exercises (OP) - 10/31/15 1614    ADLs   ADL Comments Patient seen for functional component tasks to include sit to stand from a variety of surfaces, squatting to pick up items, stair negotiation, rolling in bed and stepping backwards. CGA for acts in standing and squatting, cues for rolling in bed.Simulated work tasks for squatting and holding position to manage using tools. Leg press for 2 sets of 10 reps, 150# this date.     Neurological  Re-education Exercises   Other Exercises 1 Patient seen for initial instruction of LSVT BIG exercises: LSVT Daily Session Maximal Daily Exercises: Sustained movements are designed to rescale the amplitude of movement output for generalization to daily functional activities. Performed as follows for 1 set of 10 repetitions each: Multi directional sustained movements- 1) Floor to ceiling, 2) Side to side. Multi directional Repetitive movements performed in standing and are designed to provide retraining effort needed for sustained muscle activation in tasks Performed as follows: 3) Step and reach forward, 4) Step and Reach Backwards, 5) Step and reach sideways, 6) Rock and reach forward/backward, 7) Rock and reach sideways. Sit to stand from mat table on lowest setting with cues for weight shift, technique for 10 reps. Patient seen for functional mobility tasks this date with emphasis on gait speed, length of steps with short distance ambulation. Patient requires cues for BIG movements and cadence. All standing exercises performed this date with SBA.   Other Exercises 2 Functional mobility with one trial of 600 feet without rest breaks this date, cues for amplitude of gait and occasional cues for reciprocal arm swing. Patient becoming more aware of step lengths and self correcting with decreased cues  Stair negotiation with cues and SBA, toe taps, reciprocal pattern for 10 reps each side.  Focused on turning behaviors with cues to work through brief hesitations.                OT Education - 10/31/15 1610    Education provided Yes   Education Details HEP, balance tasks, amplitude of gait.   Person(s) Educated Patient   Methods Explanation;Demonstration;Verbal cues   Comprehension Verbal cues required;Returned demonstration;Verbalized understanding             OT Long Term Goals - 10/25/15 1856    OT LONG TERM GOAL #1   Title Patient will improve gait speed and endurance and be able to  walk 1500 feet in 6 minutes to negotiate around the home and community safely in 4 weeks.   Baseline 1315 feet at evaluation, 1400 at 10th visit   Time 4   Period Weeks   Status On-going   OT LONG TERM GOAL #2   Title Patient will complete HEP for maximal daily exercises with modified independence in 4 weeks   Time 4   Period Weeks   Status Partially Met   OT LONG TERM GOAL #3   Title Patient will transfer from sit to stand without the use of arms safely and independently from a variety of chairs/surfaces in 4 weeks.    Baseline difficulty from low surfaces   Time 4   Period Weeks   Status On-going   OT LONG TERM GOAL #4   Title Patient will decrease frequency of freezing episodes with score of 8 or less on Freezing of Gait Questionnaire    Baseline score of 9 at evaluation.    Time 4   Period Weeks   Status On-going   OT LONG TERM GOAL #5   Title Patient will be modified independent with home and work tasks including squatting to pick up items from the floor.   Time 4   Period Weeks   Status On-going               Plan - 10/31/15 1611    Clinical Impression Statement Patient was able to perform exercises this date with very minimal cues for technique, has been engaging in exercises at home on a consistent basis and sister worked with him on completion over the holiday.  Went for several walks this weekend and applied principles of LSVT BIG to functional mobility. Continues to benefit LSVT BIG program for Parkinson's to improve skills to perform  necessary daily tasks.   Pt will benefit from skilled therapeutic intervention in order to improve on the following deficits (Retired) Abnormal gait;Decreased knowledge of use of DME;Decreased strength;Decreased balance;Decreased mobility;Difficulty walking;Decreased coordination   Rehab Potential Good   OT Frequency 4x / week   OT Duration 4 weeks   OT Treatment/Interventions Self-care/ADL training;Therapeutic exercise;Functional  Mobility Training;Patient/family education;Neuromuscular education;DME and/or AE instruction;Gait Training;Stair Training   Consulted and Agree with Plan of Care Patient        Problem List There are no active problems to display for this patient.  Achilles Dunk, OTR/L, CLT Lovett,Amy 10/31/2015, 4:24 PM  [place] MAIN [facility] SERVICES [address] [place], [place], [address] Phone: [phone]   Fax:  [phone]  Name: Sean Barajas MRN: [id number] Date of Birth: 11/08/1956

## 2015-11-02 ENCOUNTER — Ambulatory Visit: Payer: BLUE CROSS/BLUE SHIELD | Admitting: Occupational Therapy

## 2015-11-02 DIAGNOSIS — G20A1 Parkinson's disease without dyskinesia, without mention of fluctuations: Secondary | ICD-10-CM

## 2015-11-02 DIAGNOSIS — G20 Parkinson's disease: Secondary | ICD-10-CM

## 2015-11-02 DIAGNOSIS — R279 Unspecified lack of coordination: Secondary | ICD-10-CM

## 2015-11-02 DIAGNOSIS — R2681 Unsteadiness on feet: Secondary | ICD-10-CM

## 2015-11-02 DIAGNOSIS — M6281 Muscle weakness (generalized): Secondary | ICD-10-CM

## 2015-11-02 DIAGNOSIS — R262 Difficulty in walking, not elsewhere classified: Secondary | ICD-10-CM

## 2015-11-03 ENCOUNTER — Ambulatory Visit: Payer: BLUE CROSS/BLUE SHIELD | Attending: Neurology | Admitting: Occupational Therapy

## 2015-11-03 DIAGNOSIS — G20 Parkinson's disease: Secondary | ICD-10-CM

## 2015-11-03 DIAGNOSIS — R2681 Unsteadiness on feet: Secondary | ICD-10-CM

## 2015-11-03 DIAGNOSIS — M6281 Muscle weakness (generalized): Secondary | ICD-10-CM

## 2015-11-03 DIAGNOSIS — R262 Difficulty in walking, not elsewhere classified: Secondary | ICD-10-CM | POA: Diagnosis present

## 2015-11-03 DIAGNOSIS — R279 Unspecified lack of coordination: Secondary | ICD-10-CM

## 2015-11-03 DIAGNOSIS — G20A1 Parkinson's disease without dyskinesia, without mention of fluctuations: Secondary | ICD-10-CM

## 2015-11-03 NOTE — Therapy (Signed)
[place] MAIN [facility] SERVICES [address] [place], [place], [address] Phone: [phone]   Fax:  [phone]  Occupational Therapy Treatment  Patient Details  Name: Sean Barajas MRN: [id number] Date of Birth: 1956/06/23 No Data Recorded  Encounter Date: 11/02/2015      OT End of Session - 11/02/15 2059    Visit Number 14   Number of Visits 17   Date for OT Re-Evaluation 11/07/15   OT Start Time 1108   OT Stop Time 1220   OT Time Calculation (min) 72 min   Activity Tolerance Patient tolerated treatment well   Behavior During Therapy [facility] for tasks assessed/performed      Past Medical History  Diagnosis Date  . Coronary artery disease   . Hypertension   . Hyperlipidemia     Past Surgical History  Procedure Laterality Date  . Cardiac catheterization    . Coronary angioplasty    . Coronary artery bypass graft      There were no vitals filed for this visit.  Visit Diagnosis:  Parkinson's disease ([place])  Muscle weakness (generalized)  Lack of coordination  Difficulty walking  Unsteady gait      Subjective Assessment - 11/02/15 2058    Subjective  Patient reports he did his exercises and rode his bicycle at home for exercise.  Planning to return to cardiac rehab next week.     Patient Stated Goals Patient reports he wants to walk without stumbling, better balance.     Currently in Pain? No/denies   Pain Score 0-No pain                      OT Treatments/Exercises (OP) - 11/02/15 2026    ADLs   ADL Comments Patient seen for functional component tasks to include sit to stand from a variety of surfaces, squatting to pick up items, stair negotiation, rolling in bed and stepping backwards. CGA for acts in standing and squatting, cues for rolling in bed.Simulated work tasks for squatting and holding position to manage using tools. Leg press for 2 sets of 10 reps, 150# this date.    Neurological Re-education  Exercises   Other Exercises 1 Patient seen for initial instruction of LSVT BIG exercises: LSVT Daily Session Maximal Daily Exercises: Sustained movements are designed to rescale the amplitude of movement output for generalization to daily functional activities. Performed as follows for 1 set of 10 repetitions each: Multi directional sustained movements- 1) Floor to ceiling, 2) Side to side. Multi directional Repetitive movements performed in standing and are designed to provide retraining effort needed for sustained muscle activation in tasks Performed as follows: 3) Step and reach forward, 4) Step and Reach Backwards, 5) Step and reach sideways, 6) Rock and reach forward/backward, 7) Rock and reach sideways. Sit to stand from mat table on lowest setting with cues for weight shift, technique for 10 reps. Patient seen for functional mobility tasks this date with emphasis on gait speed, length of steps with short distance ambulation. Patient requires cues for BIG movements and cadence. All standing exercises performed this date with SBA   Other Exercises 2 Functional mobility with one trial of 1000 feet without rest breaks this date, cues for amplitude of gait and occasional cues for reciprocal arm swing. Patient becoming more aware of step lengths and self correcting with decreased cues Stair negotiation with cues and SBA, toe taps, reciprocal pattern for 10 reps each side. Focused on turning  behaviors with cues to work through brief hesitations.                OT Education - 11/02/15 2059    Education provided Yes   Education Details HEP, squatting to complete tasks.   Person(s) Educated Patient   Methods Explanation;Demonstration;Verbal cues   Comprehension Verbal cues required;Returned demonstration;Verbalized understanding             OT Long Term Goals - 10/25/15 1856    OT LONG TERM GOAL #1   Title Patient will improve gait speed and endurance and be able to walk 1500 feet in 6  minutes to negotiate around the home and community safely in 4 weeks.   Baseline 1315 feet at evaluation, 1400 at 10th visit   Time 4   Period Weeks   Status On-going   OT LONG TERM GOAL #2   Title Patient will complete HEP for maximal daily exercises with modified independence in 4 weeks   Time 4   Period Weeks   Status Partially Met   OT LONG TERM GOAL #3   Title Patient will transfer from sit to stand without the use of arms safely and independently from a variety of chairs/surfaces in 4 weeks.    Baseline difficulty from low surfaces   Time 4   Period Weeks   Status On-going   OT LONG TERM GOAL #4   Title Patient will decrease frequency of freezing episodes with score of 8 or less on Freezing of Gait Questionnaire    Baseline score of 9 at evaluation.    Time 4   Period Weeks   Status On-going   OT LONG TERM GOAL #5   Title Patient will be modified independent with home and work tasks including squatting to pick up items from the floor.   Time 4   Period Weeks   Status On-going               Plan - 11/02/15 2100    Clinical Impression Statement Patient continues to make progress towards goals with improved balance, functional mobility and the ability to perform maximal daily exercises.  He is demonstrating improvements in self correction of mistakes.  Incorporating functional component tasks into hierarchy tasks with success.     Pt will benefit from skilled therapeutic intervention in order to improve on the following deficits (Retired) Abnormal gait;Decreased knowledge of use of DME;Decreased strength;Decreased balance;Decreased mobility;Difficulty walking;Decreased coordination   Rehab Potential Good   OT Frequency 4x / week   OT Duration 4 weeks   OT Treatment/Interventions Self-care/ADL training;Therapeutic exercise;Functional Mobility Training;Patient/family education;Neuromuscular education;DME and/or AE instruction;Gait Training;Stair Training   Consulted and  Agree with Plan of Care Patient        Problem List There are no active problems to display for this patient.  Achilles Dunk, OTR/L, CLT  Donald Memoli 11/03/2015, 8:30 PM  [place] MAIN [facility] SERVICES [address] [place], [place], [address] Phone: [phone]   Fax:  [phone]  Name: Sean Barajas MRN: [id number] Date of Birth: 06/27/1956

## 2015-11-04 ENCOUNTER — Ambulatory Visit: Payer: BLUE CROSS/BLUE SHIELD | Admitting: Occupational Therapy

## 2015-11-04 ENCOUNTER — Encounter: Payer: Self-pay | Admitting: Occupational Therapy

## 2015-11-04 DIAGNOSIS — R2681 Unsteadiness on feet: Secondary | ICD-10-CM

## 2015-11-04 DIAGNOSIS — G20 Parkinson's disease: Secondary | ICD-10-CM

## 2015-11-04 DIAGNOSIS — R262 Difficulty in walking, not elsewhere classified: Secondary | ICD-10-CM

## 2015-11-04 DIAGNOSIS — M6281 Muscle weakness (generalized): Secondary | ICD-10-CM

## 2015-11-04 DIAGNOSIS — R279 Unspecified lack of coordination: Secondary | ICD-10-CM

## 2015-11-04 DIAGNOSIS — G20A1 Parkinson's disease without dyskinesia, without mention of fluctuations: Secondary | ICD-10-CM

## 2015-11-04 NOTE — Therapy (Signed)
[place] MAIN [facility] SERVICES [address] [place], [place], [address] Phone: [phone]   Fax:  [phone]  Occupational Therapy Treatment  Patient Details  Name: Sean Barajas MRN: [id number] Date of Birth: 1956-09-22 No Data Recorded  Encounter Date: 11/04/2015      OT End of Session - 11/04/15 1633    Visit Number 16   Number of Visits 17   Date for OT Re-Evaluation 11/07/15   OT Start Time 1126   OT Stop Time 1240   OT Time Calculation (min) 74 min   Activity Tolerance Patient tolerated treatment well   Behavior During Therapy [facility] for tasks assessed/performed      Past Medical History  Diagnosis Date  . Coronary artery disease   . Hypertension   . Hyperlipidemia     Past Surgical History  Procedure Laterality Date  . Cardiac catheterization    . Coronary angioplasty    . Coronary artery bypass graft      There were no vitals filed for this visit.  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    Patient Stated Goals Patient reports he wants to walk without stumbling, better balance.     Currently in Pain? No/denies   Pain Score 0-No pain   Multiple Pain Sites No                      OT Treatments/Exercises (OP) - 11/04/15 1629    ADLs   ADL Comments Functional component tasks of sit to stand, squatting, stair negotitation, curb management, stepping backwards.   Neurological Re-education Exercises   Other Exercises 1 Patient seen for initial instruction of LSVT BIG exercises: LSVT Daily Session Maximal Daily Exercises: Sustained movements are designed to rescale the amplitude of movement output for generalization to  daily functional activities. Performed as follows for 1 set of 10 repetitions each: Multi directional sustained movements- 1) Floor to ceiling, 2) Side to side. Multi directional Repetitive movements performed in standing and are designed to provide retraining effort needed for sustained muscle activation in tasks Performed as follows: 3) Step and reach forward, 4) Step and Reach Backwards, 5) Step and reach sideways, 6) Rock and reach forward/backward, 7) Rock and reach sideways. Sit to stand from mat table on lowest setting with cues for weight shift, technique for 10 reps. Patient seen for functional mobility tasks this date with emphasis on gait speed, length of steps with short distance ambulation. Patient requires cues for BIG movements and cadence. All standing exercises performed this date independently.    Other Exercises 2 Functional mobility for 1300 feet with occasional cues for amplitude of steps, good reciprocal arm swing this date.                OT Education - 11/04/15 1632    Education provided Yes   Education Details addition of stomp to exercises to ensure picking up foot with greater amplitude   Person(s) Educated Patient   Methods Explanation;Demonstration;Verbal cues   Comprehension Verbal cues required;Returned demonstration;Verbalized understanding             OT Long Term Goals - 10/25/15  [place] #1   Title Patient will improve gait speed and endurance and be able to walk 1500 feet in 6 minutes to negotiate around the home and community safely in 4 weeks.   Baseline 1315 feet at evaluation, 1400 at 10th visit   Time 4   Period Weeks   Status On-going   OT LONG TERM GOAL #2   Title Patient will complete HEP for maximal daily exercises with modified independence in 4 weeks   Time 4   Period Weeks   Status Partially Met   OT LONG TERM GOAL #3   Title Patient will transfer from sit to stand without the use of arms safely and independently  from a variety of chairs/surfaces in 4 weeks.    Baseline difficulty from low surfaces   Time 4   Period Weeks   Status On-going   OT LONG TERM GOAL #4   Title Patient will decrease frequency of freezing episodes with score of 8 or less on Freezing of Gait Questionnaire    Baseline score of 9 at evaluation.    Time 4   Period Weeks   Status On-going   OT LONG TERM GOAL #5   Title Patient will be modified independent with home and work tasks including squatting to pick up items from the floor.   Time 4   Period Weeks   Status On-going               Plan - 11/04/15 1633    Clinical Impression Statement Patient was able to complete maximal daily exercises this date independently in correct form and sequence.  Will reassess patient next session and plan to discharge from OT services.  He has made significant progress in all areas and will benefit from continuing his HEP daily.   Pt will benefit from skilled therapeutic intervention in order to improve on the following deficits (Retired) Abnormal gait;Decreased knowledge of use of DME;Decreased strength;Decreased balance;Decreased mobility;Difficulty walking;Decreased coordination   Rehab Potential Good   OT Frequency 4x / week   OT Duration 4 weeks   OT Treatment/Interventions Self-care/ADL training;Therapeutic exercise;Functional Mobility Training;Patient/family education;Neuromuscular education;DME and/or AE instruction;Gait Training;Stair Training   Consulted and Agree with Plan of Care Patient        Problem List There are no active problems to display for this patient.  Achilles Dunk, OTR/L, CLT Sean Barajas 11/04/2015, 4:36 PM  [place] MAIN [facility] SERVICES [address] [place], [place], [address] Phone: [phone]   Fax:  [phone]  Name: Sean Barajas MRN: [id number] Date of Birth: May 05, 1956

## 2015-11-04 NOTE — Therapy (Signed)
[place] MAIN [facility] SERVICES [address] [place], [place], [address] Phone: [phone]   Fax:  [phone]  Occupational Therapy Treatment  Patient Details  Name: Sean Barajas MRN: [id number] Date of Birth: [birth year] No Data Recorded  Encounter Date: 11/03/2015      OT End of Session - 11/04/15 1613    Visit Number 15   Number of Visits 17   Date for OT Re-Evaluation 11/07/15   OT Start Time 1100   OT Stop Time 1200   OT Time Calculation (min) 60 min   Activity Tolerance Patient tolerated treatment well   Behavior During Therapy [facility] for tasks assessed/performed      Past Medical History  Diagnosis Date  . Coronary artery disease   . Hypertension   . Hyperlipidemia     Past Surgical History  Procedure Laterality Date  . Cardiac catheterization    . Coronary angioplasty    . Coronary artery bypass graft      There were no vitals filed for this visit.  Visit Diagnosis:  Parkinson's disease ([place])  Muscle weakness (generalized)  Lack of coordination  Difficulty walking  Unsteady gait      Subjective Assessment - 11/03/15 1608    Subjective  Patient reports he has tried using the word "up" when experiencing freezing behaviors and feels it really works.    Patient Stated Goals Patient reports he wants to walk without stumbling, better balance.     Currently in Pain? No/denies   Pain Score 0-No pain   Multiple Pain Sites No                      OT Treatments/Exercises (OP) - 11/03/15 1609    ADLs   ADL Comments Focused on squatting tasks this date to complete work related items such as squatting to pick up and manage tools, checking areas of a car, wheel, bumper, etc.  sit to stand from multiple surfaces this date including low swing outdoors (unstable surface).     Neurological Re-education Exercises   Other Exercises 1 Patient seen for initial instruction of LSVT BIG exercises: LSVT Daily  Session Maximal Daily Exercises: Sustained movements are designed to rescale the amplitude of movement output for generalization to daily functional activities. Performed as follows for 1 set of 10 repetitions each: Multi directional sustained movements- 1) Floor to ceiling, 2) Side to side. Multi directional Repetitive movements performed in standing and are designed to provide retraining effort needed for sustained muscle activation in tasks Performed as follows: 3) Step and reach forward, 4) Step and Reach Backwards, 5) Step and reach sideways, 6) Rock and reach forward/backward, 7) Rock and reach sideways. Sit to stand from mat table on lowest setting with cues for weight shift, technique for 10 reps. Patient seen for functional mobility tasks this date with emphasis on gait speed, length of steps with short distance ambulation. Patient requires cues for BIG movements and cadence. All standing exercises performed this date with SBA   Other Exercises 2 Functional mobility with one trial of 1000 feet, then 500 more feet after short rest break, cues for amplitude of gait and occasional cues for reciprocal arm swing. Patient becoming more aware of step lengths and self correcting with decreased cues Stair negotiation with cues.                OT Education - 11/03/15 1613    Education provided Yes   Education Details  HEP   Person(s) Educated Patient   Methods Explanation;Demonstration;Verbal cues   Comprehension Verbal cues required;Verbalized understanding;Returned demonstration             OT Long Term Goals - 10/25/15 1856    OT LONG TERM GOAL #1   Title Patient will improve gait speed and endurance and be able to walk 1500 feet in 6 minutes to negotiate around the home and community safely in 4 weeks.   Baseline 1315 feet at evaluation, 1400 at 10th visit   Time 4   Period Weeks   Status On-going   OT LONG TERM GOAL #2   Title Patient will complete HEP for maximal daily  exercises with modified independence in 4 weeks   Time 4   Period Weeks   Status Partially Met   OT LONG TERM GOAL #3   Title Patient will transfer from sit to stand without the use of arms safely and independently from a variety of chairs/surfaces in 4 weeks.    Baseline difficulty from low surfaces   Time 4   Period Weeks   Status On-going   OT LONG TERM GOAL #4   Title Patient will decrease frequency of freezing episodes with score of 8 or less on Freezing of Gait Questionnaire    Baseline score of 9 at evaluation.    Time 4   Period Weeks   Status On-going   OT LONG TERM GOAL #5   Title Patient will be modified independent with home and work tasks including squatting to pick up items from the floor.   Time 4   Period Weeks   Status On-going               Plan - 11/03/15 1614    Clinical Impression Statement Patient able to demo home exercises with minimal cues at times when he does not have a written/pictorial plan in front of him.  Self correction of errors continues to improve and demonstrates the ability to determine when he is not performing items correctly and needs to readjust or modify.   Pt will benefit from skilled therapeutic intervention in order to improve on the following deficits (Retired) Abnormal gait;Decreased knowledge of use of DME;Decreased strength;Decreased balance;Decreased mobility;Difficulty walking;Decreased coordination   Rehab Potential Good   OT Frequency 4x / week   OT Duration 4 weeks   OT Treatment/Interventions Self-care/ADL training;Therapeutic exercise;Functional Mobility Training;Patient/family education;Neuromuscular education;DME and/or AE instruction;Gait Training;Stair Training   Consulted and Agree with Plan of Care Patient        Problem List There are no active problems to display for this patient.  Achilles Dunk, OTR/L, CLT Cierria Height 11/04/2015, 4:17 PM  [place] MAIN [facility]  SERVICES [address] [place], [place], [address] Phone: [phone]   Fax:  [phone]  Name: Sean Barajas MRN: [id number] Date of Birth: 1956/02/12

## 2015-11-07 ENCOUNTER — Ambulatory Visit: Payer: BLUE CROSS/BLUE SHIELD | Admitting: Occupational Therapy

## 2015-11-07 ENCOUNTER — Encounter: Payer: Self-pay | Admitting: Occupational Therapy

## 2015-11-07 DIAGNOSIS — R2681 Unsteadiness on feet: Secondary | ICD-10-CM

## 2015-11-07 DIAGNOSIS — M6281 Muscle weakness (generalized): Secondary | ICD-10-CM

## 2015-11-07 DIAGNOSIS — G20 Parkinson's disease: Secondary | ICD-10-CM | POA: Diagnosis not present

## 2015-11-07 DIAGNOSIS — R262 Difficulty in walking, not elsewhere classified: Secondary | ICD-10-CM

## 2015-11-07 DIAGNOSIS — R279 Unspecified lack of coordination: Secondary | ICD-10-CM

## 2015-11-07 DIAGNOSIS — G20A1 Parkinson's disease without dyskinesia, without mention of fluctuations: Secondary | ICD-10-CM

## 2015-11-08 NOTE — Progress Notes (Signed)
Cardiac Individual Treatment Plan  Patient Details  Name: Sean Barajas MRN: [id number] Date of Birth: [birth year] Referring Provider:  Marcina Millard, MD  Initial Encounter Date:    Visit Diagnosis: Stented coronary artery  Patient's Home Medications on Admission:  Current outpatient prescriptions:  .  acetaminophen (TYLENOL) 500 MG tablet, Take 1,000 mg by mouth every 6 (six) hours as needed for mild pain., Disp: , Rfl:  .  albuterol (PROVENTIL HFA;VENTOLIN HFA) 108 (90 BASE) MCG/ACT inhaler, Inhale 2 puffs into the lungs every 6 (six) hours as needed for wheezing or shortness of breath., Disp: , Rfl:  .  ALPRAZolam (XANAX) 0.5 MG tablet, Take 0.5 mg by mouth 2 (two) times daily as needed for anxiety., Disp: , Rfl:  .  ARTIFICIAL TEAR OP, Apply 1 drop to eye as needed (for dry eyes)., Disp: , Rfl:  .  aspirin EC 81 MG tablet, Take 81 mg by mouth daily., Disp: , Rfl:  .  carbidopa-levodopa (SINEMET CR) 50-200 MG tablet, Take 1 tablet by mouth 2 (two) times daily. , Disp: , Rfl:  .  carbidopa-levodopa (SINEMET IR) 25-100 MG tablet, Take 1.5 tablets by mouth daily., Disp: , Rfl:  .  carvedilol (COREG) 3.125 MG tablet, Take 3.125 mg by mouth 2 (two) times daily., Disp: , Rfl:  .  nitroGLYCERIN (NITROSTAT) 0.4 MG SL tablet, Place 0.4 mg under the tongue every 5 (five) minutes as needed for chest pain., Disp: , Rfl:  .  rasagiline (AZILECT) 1 MG TABS tablet, Take 1 mg by mouth daily., Disp: , Rfl:  .  rosuvastatin (CRESTOR) 20 MG tablet, Take 20 mg by mouth at bedtime., Disp: , Rfl:  .  selegiline (ELDEPRYL) 5 MG capsule, Take 5 mg by mouth 2 (two) times daily with a meal., Disp: , Rfl:  .  sildenafil (VIAGRA) 100 MG tablet, Take 100 mg by mouth daily as needed for erectile dysfunction., Disp: , Rfl:  .  ticagrelor (BRILINTA) 90 MG TABS tablet, Take 90 mg by mouth every 12 (twelve) hours., Disp: , Rfl:  .  traZODone (DESYREL) 50 MG tablet, Take 50-75 mg by mouth at bedtime., Disp: ,  Rfl:  .  valACYclovir (VALTREX) 500 MG tablet, Take 500 mg by mouth 2 (two) times daily., Disp: , Rfl:   Past Medical History: Past Medical History  Diagnosis Date  . Coronary artery disease   . Hypertension   . Hyperlipidemia     Tobacco Use: History  Smoking status  . Never Smoker   Smokeless tobacco  . Not on file    Labs: Recent Review Flowsheet Data    There is no flowsheet data to display.       Exercise Target Goals:    Exercise Program Goal: Individual exercise prescription set with THRR, safety & activity barriers. Participant demonstrates ability to understand and report RPE using BORG scale, to self-measure pulse accurately, and to acknowledge the importance of the exercise prescription.  Exercise Prescription Goal: Starting with aerobic activity 30 plus minutes a day, 3 days per week for initial exercise prescription. Provide home exercise prescription and guidelines that participant acknowledges understanding prior to discharge.  Activity Barriers & Risk Stratification:     Activity Barriers & Risk Stratification - 07/25/15 1530    Activity Barriers & Risk Stratification   Activity Barriers Back Problems;Balance Concerns;History of Falls   Risk Stratification High      6 Minute Walk:     6 Minute Walk  07/25/15 1413       6 Minute Walk   Phase Initial     Distance 1500 feet     Walk Time 6 minutes     Resting HR 58 bpm     Resting BP 138/84 mmHg     Max Ex. HR 95 bpm     Max Ex. BP 136/80 mmHg     RPE 11     Symptoms No        Initial Exercise Prescription:     Initial Exercise Prescription - 07/25/15 1400    Date of Initial Exercise Prescription   Date 07/25/15   Treadmill   MPH 2.5   Grade 0   Minutes 15   Bike   Level 0.4   Minutes 10   Recumbant Bike   Level 3   RPM 50   Watts 25   Minutes 10   NuStep   Level 3   Watts 40   Minutes 15   Arm Ergometer   Level 1   Watts 8   Minutes 10   Arm/Foot Ergometer    Level 4   Watts 15   Minutes 10   Cybex   Level 1   RPM 50   Minutes 10   Recumbant Elliptical   Level 1   RPM 40   Watts 10   Minutes 10   Elliptical   Level 1   Speed 3   Minutes 1   REL-XR   Level 2   Watts 35   Minutes 15   Prescription Details   Frequency (times per week) 3   Duration Progress to 30 minutes of continuous aerobic without signs/symptoms of physical distress   Intensity   THRR REST +  30   Ratings of Perceived Exertion 11-15   Progression Continue progressive overload as per policy without signs/symptoms or physical distress.   Resistance Training   Training Prescription Yes   Weight 2   Reps 10-15      Exercise Prescription Changes:     Exercise Prescription Changes      08/11/15 0900 08/11/15 1300         Exercise Review   Progression  Yes      Response to Exercise   Blood Pressure (Admit)  118/74 mmHg      Blood Pressure (Exercise)  146/86 mmHg      Blood Pressure (Exit)  118/64 mmHg      Heart Rate (Admit)  62 bpm      Heart Rate (Exercise)  98 bpm      Heart Rate (Exit)  58 bpm      Rating of Perceived Exertion (Exercise)  12      Duration  Progress to 30 minutes of continuous aerobic without signs/symptoms of physical distress      Intensity  Rest + 30      Progression  Continue progressive overload as per policy without signs/symptoms or physical distress.      Resistance Training   Training Prescription  Yes      Weight  4      Reps  10-15      Interval Training   Interval Training  No      Treadmill   MPH  3      Grade  0      Minutes  15      Recumbant Bike   Level 4 4      RPM 65 65  Minutes 15 15      NuStep   Level  4      Watts  50      Minutes  15         Discharge Exercise Prescription (Final Exercise Prescription Changes):     Exercise Prescription Changes - 08/11/15 1300    Exercise Review   Progression Yes   Response to Exercise   Blood Pressure (Admit) 118/74 mmHg   Blood Pressure (Exercise)  146/86 mmHg   Blood Pressure (Exit) 118/64 mmHg   Heart Rate (Admit) 62 bpm   Heart Rate (Exercise) 98 bpm   Heart Rate (Exit) 58 bpm   Rating of Perceived Exertion (Exercise) 12   Duration Progress to 30 minutes of continuous aerobic without signs/symptoms of physical distress   Intensity Rest + 30   Progression Continue progressive overload as per policy without signs/symptoms or physical distress.   Resistance Training   Training Prescription Yes   Weight 4   Reps 10-15   Interval Training   Interval Training No   Treadmill   MPH 3   Grade 0   Minutes 15   Recumbant Bike   Level 4   RPM 65   Minutes 15   NuStep   Level 4   Watts 50   Minutes 15      Nutrition:  Target Goals: Understanding of nutrition guidelines, daily intake of sodium 1500mg , cholesterol 200mg , calories 30% from fat and 7% or less from saturated fats, daily to have 5 or more servings of fruits and vegetables.  Biometrics:     Pre Biometrics - 07/25/15 1406    Pre Biometrics   Height 5' 10.4" (1.788 m)   Weight 203 lb 4.8 oz (92.216 kg)   Waist Circumference 37.75 inches   Hip Circumference 39.75 inches   Waist to Hip Ratio 0.95 %   BMI (Calculated) 28.9       Nutrition Therapy Plan and Nutrition Goals:     Nutrition Therapy & Goals - 07/25/15 1529    Nutrition Therapy   Drug/Food Interactions Statins/Certain Fruits   Intervention Plan   Intervention Using nutrition plan and personal goals to gain a healthy nutrition lifestyle. Add exercise as prescribed.      Nutrition Discharge: Rate Your Plate Scores:   Nutrition Goals Re-Evaluation:     Nutrition Goals Re-Evaluation      09/08/15 1107           Personal Goal #1 Re-Evaluation   Personal Goal #1 Sean Barajas has been dealing with his Parkinson's disease and going to a neurologist who is going to send him for physical therapy. Has been out of Cardiac Rehab since 08/11/2015       Goal Progress Seen No           Psychosocial: Target Goals: Acknowledge presence or absence of depression, maximize coping skills, provide positive support system. Participant is able to verbalize types and ability to use techniques and skills needed for reducing stress and depression.  Initial Review & Psychosocial Screening:   Quality of Life Scores:     Quality of Life - 07/25/15 1549    Quality of Life Scores   Health/Function Pre 14.67 %   Socioeconomic Pre 24.21 %   Psych/Spiritual Pre 21.07 %   Family Pre 22.5 %   GLOBAL Pre 19 %      PHQ-9:     Recent Review Flowsheet Data    Depression screen [facility] 2/9 07/25/2015  Decreased Interest 2   Down, Depressed, Hopeless 0   PHQ - 2 Score 2   Altered sleeping 3   Tired, decreased energy 3   Change in appetite 0   Feeling bad or failure about yourself  0   Trouble concentrating 0   Moving slowly or fidgety/restless 3   Suicidal thoughts 0   PHQ-9 Score 11   Difficult doing work/chores Somewhat difficult       Psychosocial Evaluation and Intervention:   Psychosocial Re-Evaluation:     Psychosocial Re-Evaluation      09/08/15 1109           Psychosocial Re-Evaluation   Interventions Encouraged to attend Cardiac Rehabilitation for the exercise;Stress management education       Comments Stress due to "late stage Parkinson's disease Sean Barajas reports". Sean Barajas has been dealing with his Parkinson's disease and going to a neurologist who is going to send him for physical therapy. Has been out of Cardiac Rehab since 08/11/2015          Vocational Rehabilitation: Provide vocational rehab assistance to qualifying candidates.   Vocational Rehab Evaluation & Intervention:     Vocational Rehab - 07/25/15 1530    Initial Vocational Rehab Evaluation & Intervention   Assessment shows need for Vocational Rehabilitation No      Education: Education Goals: Education classes will be provided on a weekly basis, covering required topics. Participant  will state understanding/return demonstration of topics presented.  Learning Barriers/Preferences:     Learning Barriers/Preferences - 07/25/15 1530    Learning Barriers/Preferences   Learning Barriers None   Learning Preferences None      Education Topics: General Nutrition Guidelines/Fats and Fiber: -Group instruction provided by verbal, written material, models and posters to present the general guidelines for heart healthy nutrition. Gives an explanation and review of dietary fats and fiber.   Controlling Sodium/Reading Food Labels: -Group verbal and written material supporting the discussion of sodium use in heart healthy nutrition. Review and explanation with models, verbal and written materials for utilization of the food label.   Exercise Physiology & Risk Factors: - Group verbal and written instruction with models to review the exercise physiology of the cardiovascular system and associated critical values. Details cardiovascular disease risk factors and the goals associated with each risk factor.          Cardiac Rehab from 08/11/2015 in [facility] Cardiac Rehab   Date  08/11/15   Educator  SW   Instruction Review Code  2- meets goals/outcomes      Aerobic Exercise & Resistance Training: - Gives group verbal and written discussion on the health impact of inactivity. On the components of aerobic and resistive training programs and the benefits of this training and how to safely progress through these programs.   Flexibility, Balance, General Exercise Guidelines: - Provides group verbal and written instruction on the benefits of flexibility and balance training programs. Provides general exercise guidelines with specific guidelines to those with heart or lung disease. Demonstration and skill practice provided.   Stress Management: - Provides group verbal and written instruction about the health risks of elevated stress, cause of high stress, and healthy ways to reduce  stress.   Depression: - Provides group verbal and written instruction on the correlation between heart/lung disease and depressed mood, treatment options, and the stigmas associated with seeking treatment.   Anatomy & Physiology of the Heart: - Group verbal and written instruction and models provide basic cardiac anatomy and physiology, with  the coronary electrical and arterial systems. Review of: AMI, Angina, Valve disease, Heart Failure, Cardiac Arrhythmia, Pacemakers, and the ICD.   Cardiac Procedures: - Group verbal and written instruction and models to describe the testing methods done to diagnose heart disease. Reviews the outcomes of the test results. Describes the treatment choices: Medical Management, Angioplasty, or Coronary Bypass Surgery.   Cardiac Medications: - Group verbal and written instruction to review commonly prescribed medications for heart disease. Reviews the medication, class of the drug, and side effects. Includes the steps to properly store meds and maintain the prescription regimen.   Go Sex-Intimacy & Heart Disease, Get SMART - Goal Setting: - Group verbal and written instruction through game format to discuss heart disease and the return to sexual intimacy. Provides group verbal and written material to discuss and apply goal setting through the application of the S.M.A.R.T. Method.   Other Matters of the Heart: - Provides group verbal, written materials and models to describe Heart Failure, Angina, Valve Disease, and Diabetes in the realm of heart disease. Includes description of the disease process and treatment options available to the cardiac patient.   Exercise & Equipment Safety: - Individual verbal instruction and demonstration of equipment use and safety with use of the equipment.      [facility]   Date  07/25/15   Educator  C. Enterkin,RN   Instruction Review Code  1- partially meets, needs review/practice       Infection Prevention: - Provides verbal and written material to individual with discussion of infection control including proper hand washing and proper equipment cleaning during exercise session.      [facility]   Date  07/25/15   Educator  C. Enterkin,RN   Instruction Review Code  2- meets goals/outcomes      Falls Prevention: - Provides verbal and written material to individual with discussion of falls prevention and safety.      [facility]   Date  07/25/15   Educator  C. Enterkin,RN   Instruction Review Code  2- meets goals/outcomes      Diabetes: - Individual verbal and written instruction to review signs/symptoms of diabetes, desired ranges of glucose level fasting, after meals and with exercise. Advice that pre and post exercise glucose checks will be done for 3 sessions at entry of program.    Knowledge Questionnaire Score:     Knowledge Questionnaire Score - 07/25/15 1427    Knowledge Questionnaire Score   Pre Score 22      Personal Goals and Risk Factors at Admission:     Personal Goals and Risk Factors at Admission - 07/25/15 1531    Personal Goals and Risk Factors on Admission    Weight Management No   Increase Aerobic Exercise and Physical Activity Yes   Intervention While in program, learn and follow the exercise prescription taught. Start at a low level workload and increase workload after able to maintain previous level for 30 minutes. Increase time before increasing intensity.   Quit Smoking No   Understand more about Heart/Pulmonary Disease. Yes   Intervention While in program utilize professionals for any questions, and attend the education sessions. Great websites to use are www.americanheart.org or www.lung.org for reliable information.   Diabetes No   Hypertension Yes   Goal Participant will see blood pressure controlled within the values of 140/4090mm/Hg or within  value directed by their  physician.   Intervention Provide nutrition & aerobic exercise along with prescribed medications to achieve BP 140/90 or less.   Lipids Yes   Goal Cholesterol controlled with medications as prescribed, with individualized exercise RX and with personalized nutrition plan. Value goals: LDL < 70mg , HDL > 40mg . Participant states understanding of desired cholesterol values and following prescriptions.   Intervention Provide nutrition & aerobic exercise along with prescribed medications to achieve LDL 70mg , HDL >40mg .   Stress Yes   Goal To meet with psychosocial counselor for stress and relaxation information and guidance. To state understanding of performing relaxation techniques and or identifying personal stressors.   Intervention Provide education on types of stress, identifiying stressors, and ways to cope with stress. Provide demonstration and active practice of relaxation techniques.   Personal Goal Other No      Personal Goals and Risk Factors Review:      Goals and Risk Factor Review      09/08/15 1108 09/14/15 1417 10/12/15 1440       Increase Aerobic Exercise and Physical Activity   Goals Progress/Improvement seen  No No --  Sean Barajas has been out getting OT therapy for concerns with his Parkinson's. He plans to return to program this week.     Comments Sean Barajas has been dealing with his Parkinson's disease and going to a neurologist who is going to send him for physical therapy. Has been out of Cardiac Rehab since 08/11/2015 Out a lot.      Hypertension   Goal --  Bp checked at his neurology appt lately.        Stress   Goal --  Sean Barajas has been dealing with his Parkinson's disease and going to a neurologist who is going to send him for physical therapy. Has been out of Cardiac Rehab since 08/11/2015          Personal Goals Discharge (Final Personal Goals and Risk Factors Review):      Goals and Risk Factor Review - 10/12/15 1440    Increase Aerobic Exercise  and Physical Activity   Goals Progress/Improvement seen  --  Sean Barajas has been out getting OT therapy for concerns with his Parkinson's. He plans to return to program this week.      ITP Comments:     ITP Comments      11/08/15 1338           ITP Comments 30 day review preparation  Continue with ITP  remains out for PT to help with Parkinson Disease          Comments:

## 2015-11-09 NOTE — Therapy (Signed)
[place] MAIN [facility] SERVICES [address] [place], [place], [address] Phone: [phone]   Fax:  [phone]  Occupational Therapy Treatment/Discharge Summary   Patient Details  Name: Sean Barajas MRN: [id number] Date of Birth: 03-29-1956 No Data Recorded  Encounter Date: 11/07/2015      OT End of Session - 11/09/15 1431    Visit Number 17   Number of Visits 17   Date for OT Re-Evaluation 11/07/15   Activity Tolerance Patient tolerated treatment well   Behavior During Therapy [facility] for tasks assessed/performed      Past Medical History  Diagnosis Date  . Coronary artery disease   . Hypertension   . Hyperlipidemia     Past Surgical History  Procedure Laterality Date  . Cardiac catheterization    . Coronary angioplasty    . Coronary artery bypass graft      There were no vitals filed for this visit.  Visit Diagnosis:  Parkinson's disease ([place])  Muscle weakness (generalized)  Lack of coordination  Difficulty walking  Unsteady gait      Subjective Assessment - 11/09/15 1516    Subjective  Patient reports he is glad he came for LSVT BIG program, feels he was able to see improvements and feels more hopeful in dealing with disease process.  Discussed recommendations regarding LSVT LOUD program to address his voice quality and volume, swallowing issues and pain with phonation.  Patient agreeable to program.     Patient Stated Goals Patient reports he wants to walk without stumbling, better balance.                        OT Treatments/Exercises (OP) - 11/09/15 1428    ADLs   ADL Comments AAROM of LUE with guiding and facilitation of movement for shoulder flexion and ABD, elbow extension   Neurological Re-education Exercises   Other Exercises 1 Patient seen for initial instruction of LSVT BIG exercises: LSVT Daily Session Maximal Daily Exercises: Sustained movements are designed to rescale the amplitude of  movement output for generalization to daily functional activities. Performed as follows for 1 set of 10 repetitions each: Multi directional sustained movements- 1) Floor to ceiling, 2) Side to side. Multi directional Repetitive movements performed in standing and are designed to provide retraining effort needed for sustained muscle activation in tasks Performed as follows: 3) Step and reach forward, 4) Step and Reach Backwards, 5) Step and reach sideways, 6) Rock and reach forward/backward, 7) Rock and reach sideways. Sit to stand from mat table on lowest setting with cues for weight shift, technique for 10 reps. Patient seen for functional mobility tasks this date with emphasis on gait speed, length of steps with short distance ambulation. Patient requires cues for BIG movements and cadence. All standing exercises performed this date independently.    Other Exercises 2 Reassessment this date 6 minute walk test 1635 feet (up from 1315 at eval), Freezing of gait questionairre 7 , (decreased from 9 at eval).  Sit to stand 9 secs (down from 18 secs at eval).  Blood pressure before walk 158/92 after 157/94.                     OT Long Term Goals - 11/09/15 1431    OT LONG TERM GOAL #1   Title Patient will improve gait speed and endurance and be able to walk 1500 feet in 6 minutes to negotiate around the home  and community safely in 4 weeks.   Baseline 1315 feet at evaluation, 1400 at 10th visit 1635 at final visit   Time 4   Period Weeks   Status Achieved   OT LONG TERM GOAL #2   Title Patient will complete HEP for maximal daily exercises with modified independence in 4 weeks   Time 4   Period Weeks   Status Achieved   OT LONG TERM GOAL #3   Title Patient will transfer from sit to stand without the use of arms safely and independently from a variety of chairs/surfaces in 4 weeks.    Time 4   Period Weeks   Status Achieved   OT LONG TERM GOAL #4   Title Patient will decrease frequency  of freezing episodes with score of 8 or less on Freezing of Gait Questionnaire    Time 4   Status Achieved   OT LONG TERM GOAL #5   Title Patient will be modified independent with home and work tasks including squatting to pick up items from the floor.   Time 4   Period Weeks   Status Achieved               Plan - 11/09/15 1431    Clinical Impression Statement Patient is now independent with maximal daily exercises and has completed intensive LSVT BIG program, attending all required sessions.  Patient made significant improvements in all areas with 6 minute walk test improving from 1315 feet at eval to 1635 feet at the end of treatment.  His ability to transfer from sit to stand without the use of arms has improved and he is able to demonstrate from a variety of surfaces and various heights.  His balance has improved with work tasks and had no reported falls during the last 4 weeks of therapy.  Recommend patient  follow up with MD regarding painful phonation with a possible referral to ENT if he has not done so.  Patient would benefit from LSVT LOUD program in the future given his currently issues with painful phonation, decreased voice quality and volume.  Will discharge patient at this time with goals met.     Pt will benefit from skilled therapeutic intervention in order to improve on the following deficits (Retired) Abnormal gait;Decreased knowledge of use of DME;Decreased strength;Decreased balance;Decreased mobility;Difficulty walking;Decreased coordination   Rehab Potential Good   OT Frequency 4x / week   OT Duration 4 weeks   OT Treatment/Interventions Self-care/ADL training;Therapeutic exercise;Functional Mobility Training;Patient/family education;Neuromuscular education;DME and/or AE instruction;Gait Training;Stair Training   Recommended Other Services ENT for painful phonation, LSVT LOUD to address speech issues, voice quality and volume   Consulted and Agree with Plan of Care  Patient        Problem List There are no active problems to display for this patient.  Achilles Dunk, OTR/L, CLT Lovett,Amy 11/09/2015, 3:16 PM  [place] MAIN [facility] SERVICES [address] [place], [place], [address] Phone: [phone]   Fax:  [phone]  Name: ANDRUE DINI MRN: [id number] Date of Birth: [birth year]

## 2015-11-09 NOTE — Addendum Note (Signed)
Addended by: Rudy JewBICE, Ellan Tess P on: 11/09/2015 09:59 AM   Modules accepted: Orders

## 2015-11-29 ENCOUNTER — Encounter: Payer: Self-pay | Admitting: *Deleted

## 2015-11-29 NOTE — Progress Notes (Signed)
Reached out to [employer] via telephone to inquire about patient returning to Cardiac Rehab.  Patient stated he wants to wait until the first of the year.  In addition, he does not know how much his deductible will be.  Therefore, he is requesting that we call him back the second week in January.  In the meantime he will check on his deductible.

## 2015-12-01 DIAGNOSIS — Z955 Presence of coronary angioplasty implant and graft: Secondary | ICD-10-CM

## 2015-12-04 NOTE — Progress Notes (Signed)
Cardiac Individual Treatment Plan  Patient Details  Name: Sean Barajas MRN: [id number] Date of Birth: [birth year] Referring Provider:  Marcina Millard, MD  Initial Encounter Date:    Visit Diagnosis: Stented coronary artery - Plan: CARDIAC REHAB 30 DAY REVIEW  Patient's Home Medications on Admission:  Current outpatient prescriptions:  .  acetaminophen (TYLENOL) 500 MG tablet, Take 1,000 mg by mouth every 6 (six) hours as needed for mild pain., Disp: , Rfl:  .  albuterol (PROVENTIL HFA;VENTOLIN HFA) 108 (90 BASE) MCG/ACT inhaler, Inhale 2 puffs into the lungs every 6 (six) hours as needed for wheezing or shortness of breath., Disp: , Rfl:  .  ALPRAZolam (XANAX) 0.5 MG tablet, Take 0.5 mg by mouth 2 (two) times daily as needed for anxiety., Disp: , Rfl:  .  ARTIFICIAL TEAR OP, Apply 1 drop to eye as needed (for dry eyes)., Disp: , Rfl:  .  aspirin EC 81 MG tablet, Take 81 mg by mouth daily., Disp: , Rfl:  .  carbidopa-levodopa (SINEMET CR) 50-200 MG tablet, Take 1 tablet by mouth 2 (two) times daily. , Disp: , Rfl:  .  carbidopa-levodopa (SINEMET IR) 25-100 MG tablet, Take 1.5 tablets by mouth daily., Disp: , Rfl:  .  carvedilol (COREG) 3.125 MG tablet, Take 3.125 mg by mouth 2 (two) times daily., Disp: , Rfl:  .  nitroGLYCERIN (NITROSTAT) 0.4 MG SL tablet, Place 0.4 mg under the tongue every 5 (five) minutes as needed for chest pain., Disp: , Rfl:  .  rasagiline (AZILECT) 1 MG TABS tablet, Take 1 mg by mouth daily., Disp: , Rfl:  .  rosuvastatin (CRESTOR) 20 MG tablet, Take 20 mg by mouth at bedtime., Disp: , Rfl:  .  selegiline (ELDEPRYL) 5 MG capsule, Take 5 mg by mouth 2 (two) times daily with a meal., Disp: , Rfl:  .  sildenafil (VIAGRA) 100 MG tablet, Take 100 mg by mouth daily as needed for erectile dysfunction., Disp: , Rfl:  .  ticagrelor (BRILINTA) 90 MG TABS tablet, Take 90 mg by mouth every 12 (twelve) hours., Disp: , Rfl:  .  traZODone (DESYREL) 50 MG tablet, Take  50-75 mg by mouth at bedtime., Disp: , Rfl:  .  valACYclovir (VALTREX) 500 MG tablet, Take 500 mg by mouth 2 (two) times daily., Disp: , Rfl:   Past Medical History: Past Medical History  Diagnosis Date  . Coronary artery disease   . Hypertension   . Hyperlipidemia     Tobacco Use: History  Smoking status  . Never Smoker   Smokeless tobacco  . Not on file    Labs: Recent Review Flowsheet Data    There is no flowsheet data to display.       Exercise Target Goals:    Exercise Program Goal: Individual exercise prescription set with THRR, safety & activity barriers. Participant demonstrates ability to understand and report RPE using BORG scale, to self-measure pulse accurately, and to acknowledge the importance of the exercise prescription.  Exercise Prescription Goal: Starting with aerobic activity 30 plus minutes a day, 3 days per week for initial exercise prescription. Provide home exercise prescription and guidelines that participant acknowledges understanding prior to discharge.  Activity Barriers & Risk Stratification:     Activity Barriers & Risk Stratification - 07/25/15 1530    Activity Barriers & Risk Stratification   Activity Barriers Back Problems;Balance Concerns;History of Falls   Risk Stratification High      6 Minute Walk:     6  Minute Walk      07/25/15 1413       6 Minute Walk   Phase Initial     Distance 1500 feet     Walk Time 6 minutes     Resting HR 58 bpm     Resting BP 138/84 mmHg     Max Ex. HR 95 bpm     Max Ex. BP 136/80 mmHg     RPE 11     Symptoms No        Initial Exercise Prescription:     Initial Exercise Prescription - 07/25/15 1400    Date of Initial Exercise Prescription   Date 07/25/15   Treadmill   MPH 2.5   Grade 0   Minutes 15   Bike   Level 0.4   Minutes 10   Recumbant Bike   Level 3   RPM 50   Watts 25   Minutes 10   NuStep   Level 3   Watts 40   Minutes 15   Arm Ergometer   Level 1   Watts 8    Minutes 10   Arm/Foot Ergometer   Level 4   Watts 15   Minutes 10   Cybex   Level 1   RPM 50   Minutes 10   Recumbant Elliptical   Level 1   RPM 40   Watts 10   Minutes 10   Elliptical   Level 1   Speed 3   Minutes 1   REL-XR   Level 2   Watts 35   Minutes 15   Prescription Details   Frequency (times per week) 3   Duration Progress to 30 minutes of continuous aerobic without signs/symptoms of physical distress   Intensity   THRR REST +  30   Ratings of Perceived Exertion 11-15   Progression Continue progressive overload as per policy without signs/symptoms or physical distress.   Resistance Training   Training Prescription Yes   Weight 2   Reps 10-15      Exercise Prescription Changes:     Exercise Prescription Changes      08/11/15 0900 08/11/15 1300 12/01/15 1500       Exercise Review   Progression  Yes      Response to Exercise   Blood Pressure (Admit)  118/74 mmHg      Blood Pressure (Exercise)  146/86 mmHg      Blood Pressure (Exit)  118/64 mmHg      Heart Rate (Admit)  62 bpm      Heart Rate (Exercise)  98 bpm      Heart Rate (Exit)  58 bpm      Rating of Perceived Exertion (Exercise)  12      Comments   patient has not attended since September.       Duration  Progress to 30 minutes of continuous aerobic without signs/symptoms of physical distress      Intensity  Rest + 30      Progression  Continue progressive overload as per policy without signs/symptoms or physical distress.      Resistance Training   Training Prescription  Yes      Weight  4      Reps  10-15      Interval Training   Interval Training  No      Treadmill   MPH  3      Grade  0      Minutes  15      Recumbant Bike   Level 4 4      RPM 65 65      Minutes 15 15      NuStep   Level  4      Watts  50      Minutes  15         Discharge Exercise Prescription (Final Exercise Prescription Changes):     Exercise Prescription Changes - 12/01/15 1500    Response to  Exercise   Comments patient has not attended since September.        Nutrition:  Target Goals: Understanding of nutrition guidelines, daily intake of sodium 1500mg , cholesterol 200mg , calories 30% from fat and 7% or less from saturated fats, daily to have 5 or more servings of fruits and vegetables.  Biometrics:     Pre Biometrics - 07/25/15 1406    Pre Biometrics   Height 5' 10.4" (1.788 m)   Weight 203 lb 4.8 oz (92.216 kg)   Waist Circumference 37.75 inches   Hip Circumference 39.75 inches   Waist to Hip Ratio 0.95 %   BMI (Calculated) 28.9       Nutrition Therapy Plan and Nutrition Goals:     Nutrition Therapy & Goals - 07/25/15 1529    Nutrition Therapy   Drug/Food Interactions Statins/Certain Fruits   Intervention Plan   Intervention Using nutrition plan and personal goals to gain a healthy nutrition lifestyle. Add exercise as prescribed.      Nutrition Discharge: Rate Your Plate Scores:   Nutrition Goals Re-Evaluation:     Nutrition Goals Re-Evaluation      09/08/15 1107           Personal Goal #1 Re-Evaluation   Personal Goal #1 Sean Barajas has been dealing with his Parkinson's disease and going to a neurologist who is going to send him for physical therapy. Has been out of Cardiac Rehab since 08/11/2015       Goal Progress Seen No          Psychosocial: Target Goals: Acknowledge presence or absence of depression, maximize coping skills, provide positive support system. Participant is able to verbalize types and ability to use techniques and skills needed for reducing stress and depression.  Initial Review & Psychosocial Screening:   Quality of Life Scores:     Quality of Life - 07/25/15 1549    Quality of Life Scores   Health/Function Pre 14.67 %   Socioeconomic Pre 24.21 %   Psych/Spiritual Pre 21.07 %   Family Pre 22.5 %   GLOBAL Pre 19 %      PHQ-9:     Recent Review Flowsheet Data    Depression screen [facility] 2/9 07/25/2015   Decreased  Interest 2   Down, Depressed, Hopeless 0   PHQ - 2 Score 2   Altered sleeping 3   Tired, decreased energy 3   Change in appetite 0   Feeling bad or failure about yourself  0   Trouble concentrating 0   Moving slowly or fidgety/restless 3   Suicidal thoughts 0   PHQ-9 Score 11   Difficult doing work/chores Somewhat difficult       Psychosocial Evaluation and Intervention:   Psychosocial Re-Evaluation:     Psychosocial Re-Evaluation      09/08/15 1109           Psychosocial Re-Evaluation   Interventions Encouraged to attend Cardiac Rehabilitation for the exercise;Stress management education  Comments Stress due to "late stage Parkinson's disease Sean Barajas reports". Sean Barajas has been dealing with his Parkinson's disease and going to a neurologist who is going to send him for physical therapy. Has been out of Cardiac Rehab since 08/11/2015          Vocational Rehabilitation: Provide vocational rehab assistance to qualifying candidates.   Vocational Rehab Evaluation & Intervention:     Vocational Rehab - 07/25/15 1530    Initial Vocational Rehab Evaluation & Intervention   Assessment shows need for Vocational Rehabilitation No      Education: Education Goals: Education classes will be provided on a weekly basis, covering required topics. Participant will state understanding/return demonstration of topics presented.  Learning Barriers/Preferences:     Learning Barriers/Preferences - 07/25/15 1530    Learning Barriers/Preferences   Learning Barriers None   Learning Preferences None      Education Topics: General Nutrition Guidelines/Fats and Fiber: -Group instruction provided by verbal, written material, models and posters to present the general guidelines for heart healthy nutrition. Gives an explanation and review of dietary fats and fiber.   Controlling Sodium/Reading Food Labels: -Group verbal and written material supporting the discussion of sodium use in  heart healthy nutrition. Review and explanation with models, verbal and written materials for utilization of the food label.   Exercise Physiology & Risk Factors: - Group verbal and written instruction with models to review the exercise physiology of the cardiovascular system and associated critical values. Details cardiovascular disease risk factors and the goals associated with each risk factor.          Cardiac Rehab from 08/11/2015 in [facility] Cardiac and Pulmonary Rehab   Date  08/11/15   Educator  SW   Instruction Review Code  2- meets goals/outcomes      Aerobic Exercise & Resistance Training: - Gives group verbal and written discussion on the health impact of inactivity. On the components of aerobic and resistive training programs and the benefits of this training and how to safely progress through these programs.   Flexibility, Balance, General Exercise Guidelines: - Provides group verbal and written instruction on the benefits of flexibility and balance training programs. Provides general exercise guidelines with specific guidelines to those with heart or lung disease. Demonstration and skill practice provided.   Stress Management: - Provides group verbal and written instruction about the health risks of elevated stress, cause of high stress, and healthy ways to reduce stress.   Depression: - Provides group verbal and written instruction on the correlation between heart/lung disease and depressed mood, treatment options, and the stigmas associated with seeking treatment.   Anatomy & Physiology of the Heart: - Group verbal and written instruction and models provide basic cardiac anatomy and physiology, with the coronary electrical and arterial systems. Review of: AMI, Angina, Valve disease, Heart Failure, Cardiac Arrhythmia, Pacemakers, and the ICD.   Cardiac Procedures: - Group verbal and written instruction and models to describe the testing methods done to diagnose heart  disease. Reviews the outcomes of the test results. Describes the treatment choices: Medical Management, Angioplasty, or Coronary Bypass Surgery.   Cardiac Medications: - Group verbal and written instruction to review commonly prescribed medications for heart disease. Reviews the medication, class of the drug, and side effects. Includes the steps to properly store meds and maintain the prescription regimen.   Go Sex-Intimacy & Heart Disease, Get SMART - Goal Setting: - Group verbal and written instruction through game format to discuss heart disease and the return  to sexual intimacy. Provides group verbal and written material to discuss and apply goal setting through the application of the S.M.A.R.T. Method.   Other Matters of the Heart: - Provides group verbal, written materials and models to describe Heart Failure, Angina, Valve Disease, and Diabetes in the realm of heart disease. Includes description of the disease process and treatment options available to the cardiac patient.   Exercise & Equipment Safety: - Individual verbal instruction and demonstration of equipment use and safety with use of the equipment.      Cardiac Rehab from 08/11/2015 in [facility] Cardiac and Pulmonary Rehab   Date  07/25/15   Educator  C. Enterkin,RN   Instruction Review Code  1- partially meets, needs review/practice      Infection Prevention: - Provides verbal and written material to individual with discussion of infection control including proper hand washing and proper equipment cleaning during exercise session.      Cardiac Rehab from 08/11/2015 in [facility] Cardiac and Pulmonary Rehab   Date  07/25/15   Educator  C. Enterkin,RN   Instruction Review Code  2- meets goals/outcomes      Falls Prevention: - Provides verbal and written material to individual with discussion of falls prevention and safety.      Cardiac Rehab from 08/11/2015 in [facility] Cardiac and Pulmonary Rehab   Date  07/25/15   Educator  C.  Enterkin,RN   Instruction Review Code  2- meets goals/outcomes      Diabetes: - Individual verbal and written instruction to review signs/symptoms of diabetes, desired ranges of glucose level fasting, after meals and with exercise. Advice that pre and post exercise glucose checks will be done for 3 sessions at entry of program.    Knowledge Questionnaire Score:     Knowledge Questionnaire Score - 07/25/15 1427    Knowledge Questionnaire Score   Pre Score 22      Personal Goals and Risk Factors at Admission:     Personal Goals and Risk Factors at Admission - 07/25/15 1531    Personal Goals and Risk Factors on Admission    Weight Management No   Increase Aerobic Exercise and Physical Activity Yes   Intervention While in program, learn and follow the exercise prescription taught. Start at a low level workload and increase workload after able to maintain previous level for 30 minutes. Increase time before increasing intensity.   Quit Smoking No   Understand more about Heart/Pulmonary Disease. Yes   Intervention While in program utilize professionals for any questions, and attend the education sessions. Great websites to use are www.americanheart.org or www.lung.org for reliable information.   Diabetes No   Hypertension Yes   Goal Participant will see blood pressure controlled within the values of 140/13mm/Hg or within value directed by their physician.   Intervention Provide nutrition & aerobic exercise along with prescribed medications to achieve BP 140/90 or less.   Lipids Yes   Goal Cholesterol controlled with medications as prescribed, with individualized exercise RX and with personalized nutrition plan. Value goals: LDL < 70mg , HDL > 40mg . Participant states understanding of desired cholesterol values and following prescriptions.   Intervention Provide nutrition & aerobic exercise along with prescribed medications to achieve LDL 70mg , HDL >40mg .   Stress Yes   Goal To meet  with psychosocial counselor for stress and relaxation information and guidance. To state understanding of performing relaxation techniques and or identifying personal stressors.   Intervention Provide education on types of stress, identifiying stressors, and ways to  cope with stress. Provide demonstration and active practice of relaxation techniques.   Personal Goal Other No      Personal Goals and Risk Factors Review:      Goals and Risk Factor Review      09/08/15 1108 09/14/15 1417 10/12/15 1440       Increase Aerobic Exercise and Physical Activity   Goals Progress/Improvement seen  No No --  Sean Barajas has been out getting OT therapy for concerns with his Parkinson's. He plans to return to program this week.     Comments Sean Barajas has been dealing with his Parkinson's disease and going to a neurologist who is going to send him for physical therapy. Has been out of Cardiac Rehab since 08/11/2015 Out a lot.      Hypertension   Goal --  Bp checked at his neurology appt lately.        Stress   Goal --  Sean Barajas has been dealing with his Parkinson's disease and going to a neurologist who is going to send him for physical therapy. Has been out of Cardiac Rehab since 08/11/2015          Personal Goals Discharge (Final Personal Goals and Risk Factors Review):      Goals and Risk Factor Review - 10/12/15 1440    Increase Aerobic Exercise and Physical Activity   Goals Progress/Improvement seen  --  Sean Barajas has been out getting OT therapy for concerns with his Parkinson's. He plans to return to program this week.      ITP Comments:     ITP Comments      11/08/15 1338 12/04/15 1332         ITP Comments 30 day review preparation  Continue with ITP  remains out for PT to help with Parkinson Disease Ready for 30 day review.  Continue with ITP  Remains out for medical reasons. Plans to return in Jan. Is checking Insurance before returns.         Comments:

## 2015-12-07 NOTE — Addendum Note (Signed)
Addended by: Rudy JewBICE, Talli Kimmer P on: 12/07/2015 11:19 AM   Modules accepted: Orders

## 2015-12-29 NOTE — Progress Notes (Signed)
Cardiac Individual Treatment Plan  Patient Details  Name: Sean Barajas MRN: [id number] Date of Birth: [birth year] Referring Provider:  Marcina Millard, MD  Initial Encounter Date:    Visit Diagnosis: Stented coronary artery - Plan: CARDIAC REHAB 30 DAY REVIEW, CARDIAC REHAB 30 DAY REVIEW  Patient's Home Medications on Admission:  Current outpatient prescriptions:  .  acetaminophen (TYLENOL) 500 MG tablet, Take 1,000 mg by mouth every 6 (six) hours as needed for mild pain., Disp: , Rfl:  .  albuterol (PROVENTIL HFA;VENTOLIN HFA) 108 (90 BASE) MCG/ACT inhaler, Inhale 2 puffs into the lungs every 6 (six) hours as needed for wheezing or shortness of breath., Disp: , Rfl:  .  ALPRAZolam (XANAX) 0.5 MG tablet, Take 0.5 mg by mouth 2 (two) times daily as needed for anxiety., Disp: , Rfl:  .  ARTIFICIAL TEAR OP, Apply 1 drop to eye as needed (for dry eyes)., Disp: , Rfl:  .  aspirin EC 81 MG tablet, Take 81 mg by mouth daily., Disp: , Rfl:  .  carbidopa-levodopa (SINEMET CR) 50-200 MG tablet, Take 1 tablet by mouth 2 (two) times daily. , Disp: , Rfl:  .  carbidopa-levodopa (SINEMET IR) 25-100 MG tablet, Take 1.5 tablets by mouth daily., Disp: , Rfl:  .  carvedilol (COREG) 3.125 MG tablet, Take 3.125 mg by mouth 2 (two) times daily., Disp: , Rfl:  .  nitroGLYCERIN (NITROSTAT) 0.4 MG SL tablet, Place 0.4 mg under the tongue every 5 (five) minutes as needed for chest pain., Disp: , Rfl:  .  rasagiline (AZILECT) 1 MG TABS tablet, Take 1 mg by mouth daily., Disp: , Rfl:  .  rosuvastatin (CRESTOR) 20 MG tablet, Take 20 mg by mouth at bedtime., Disp: , Rfl:  .  selegiline (ELDEPRYL) 5 MG capsule, Take 5 mg by mouth 2 (two) times daily with a meal., Disp: , Rfl:  .  sildenafil (VIAGRA) 100 MG tablet, Take 100 mg by mouth daily as needed for erectile dysfunction., Disp: , Rfl:  .  ticagrelor (BRILINTA) 90 MG TABS tablet, Take 90 mg by mouth every 12 (twelve) hours., Disp: , Rfl:  .  traZODone  (DESYREL) 50 MG tablet, Take 50-75 mg by mouth at bedtime., Disp: , Rfl:  .  valACYclovir (VALTREX) 500 MG tablet, Take 500 mg by mouth 2 (two) times daily., Disp: , Rfl:   Past Medical History: Past Medical History  Diagnosis Date  . Coronary artery disease   . Hypertension   . Hyperlipidemia     Tobacco Use: History  Smoking status  . Never Smoker   Smokeless tobacco  . Not on file    Labs: Recent Review Flowsheet Data    There is no flowsheet data to display.       Exercise Target Goals:    Exercise Program Goal: Individual exercise prescription set with THRR, safety & activity barriers. Participant demonstrates ability to understand and report RPE using BORG scale, to self-measure pulse accurately, and to acknowledge the importance of the exercise prescription.  Exercise Prescription Goal: Starting with aerobic activity 30 plus minutes a day, 3 days per week for initial exercise prescription. Provide home exercise prescription and guidelines that participant acknowledges understanding prior to discharge.  Activity Barriers & Risk Stratification:     Activity Barriers & Risk Stratification - 07/25/15 1530    Activity Barriers & Risk Stratification   Activity Barriers Back Problems;Balance Concerns;History of Falls   Risk Stratification High      6 Minute Walk:  6 Minute Walk      07/25/15 1413       6 Minute Walk   Phase Initial     Distance 1500 feet     Walk Time 6 minutes     Resting HR 58 bpm     Resting BP 138/84 mmHg     Max Ex. HR 95 bpm     Max Ex. BP 136/80 mmHg     RPE 11     Symptoms No        Initial Exercise Prescription:     Initial Exercise Prescription - 07/25/15 1400    Date of Initial Exercise Prescription   Date 07/25/15   Treadmill   MPH 2.5   Grade 0   Minutes 15   Bike   Level 0.4   Minutes 10   Recumbant Bike   Level 3   RPM 50   Watts 25   Minutes 10   NuStep   Level 3   Watts 40   Minutes 15   Arm  Ergometer   Level 1   Watts 8   Minutes 10   Arm/Foot Ergometer   Level 4   Watts 15   Minutes 10   Cybex   Level 1   RPM 50   Minutes 10   Recumbant Elliptical   Level 1   RPM 40   Watts 10   Minutes 10   Elliptical   Level 1   Speed 3   Minutes 1   REL-XR   Level 2   Watts 35   Minutes 15   Prescription Details   Frequency (times per week) 3   Duration Progress to 30 minutes of continuous aerobic without signs/symptoms of physical distress   Intensity   THRR REST +  30   Ratings of Perceived Exertion 11-15   Progression Continue progressive overload as per policy without signs/symptoms or physical distress.   Resistance Training   Training Prescription Yes   Weight 2   Reps 10-15      Exercise Prescription Changes:     Exercise Prescription Changes      08/11/15 0900 08/11/15 1300 12/01/15 1500       Exercise Review   Progression  Yes      Response to Exercise   Blood Pressure (Admit)  118/74 mmHg      Blood Pressure (Exercise)  146/86 mmHg      Blood Pressure (Exit)  118/64 mmHg      Heart Rate (Admit)  62 bpm      Heart Rate (Exercise)  98 bpm      Heart Rate (Exit)  58 bpm      Rating of Perceived Exertion (Exercise)  12      Comments   patient has not attended since September.       Duration  Progress to 30 minutes of continuous aerobic without signs/symptoms of physical distress      Intensity  Rest + 30      Progression  Continue progressive overload as per policy without signs/symptoms or physical distress.      Resistance Training   Training Prescription  Yes      Weight  4      Reps  10-15      Interval Training   Interval Training  No      Treadmill   MPH  3      Grade  0      Minutes  15      Recumbant Bike   Level 4 4      RPM 65 65      Minutes 15 15      NuStep   Level  4      Watts  50      Minutes  15         Discharge Exercise Prescription (Final Exercise Prescription Changes):     Exercise Prescription Changes -  12/01/15 1500    Response to Exercise   Comments patient has not attended since September.        Nutrition:  Target Goals: Understanding of nutrition guidelines, daily intake of sodium 1500mg , cholesterol 200mg , calories 30% from fat and 7% or less from saturated fats, daily to have 5 or more servings of fruits and vegetables.  Biometrics:     Pre Biometrics - 07/25/15 1406    Pre Biometrics   Height 5' 10.4" (1.788 m)   Weight 203 lb 4.8 oz (92.216 kg)   Waist Circumference 37.75 inches   Hip Circumference 39.75 inches   Waist to Hip Ratio 0.95 %   BMI (Calculated) 28.9       Nutrition Therapy Plan and Nutrition Goals:     Nutrition Therapy & Goals - 07/25/15 1529    Nutrition Therapy   Drug/Food Interactions Statins/Certain Fruits   Intervention Plan   Intervention Using nutrition plan and personal goals to gain a healthy nutrition lifestyle. Add exercise as prescribed.      Nutrition Discharge: Rate Your Plate Scores:   Nutrition Goals Re-Evaluation:     Nutrition Goals Re-Evaluation      09/08/15 1107           Personal Goal #1 Re-Evaluation   Personal Goal #1 Paulo has been dealing with his Parkinson's disease and going to a neurologist who is going to send him for physical therapy. Has been out of Cardiac Rehab since 08/11/2015       Goal Progress Seen No          Psychosocial: Target Goals: Acknowledge presence or absence of depression, maximize coping skills, provide positive support system. Participant is able to verbalize types and ability to use techniques and skills needed for reducing stress and depression.  Initial Review & Psychosocial Screening:   Quality of Life Scores:     Quality of Life - 07/25/15 1549    Quality of Life Scores   Health/Function Pre 14.67 %   Socioeconomic Pre 24.21 %   Psych/Spiritual Pre 21.07 %   Family Pre 22.5 %   GLOBAL Pre 19 %      PHQ-9:     Recent Review Flowsheet Data    Depression screen [facility]  2/9 07/25/2015   Decreased Interest 2   Down, Depressed, Hopeless 0   PHQ - 2 Score 2   Altered sleeping 3   Tired, decreased energy 3   Change in appetite 0   Feeling bad or failure about yourself  0   Trouble concentrating 0   Moving slowly or fidgety/restless 3   Suicidal thoughts 0   PHQ-9 Score 11   Difficult doing work/chores Somewhat difficult       Psychosocial Evaluation and Intervention:   Psychosocial Re-Evaluation:     Psychosocial Re-Evaluation      09/08/15 1109           Psychosocial Re-Evaluation   Interventions Encouraged to attend Cardiac Rehabilitation for the exercise;Stress management education  Comments Stress due to "late stage Parkinson's disease Gerasimos reports". Oluwatobi has been dealing with his Parkinson's disease and going to a neurologist who is going to send him for physical therapy. Has been out of Cardiac Rehab since 08/11/2015          Vocational Rehabilitation: Provide vocational rehab assistance to qualifying candidates.   Vocational Rehab Evaluation & Intervention:     Vocational Rehab - 07/25/15 1530    Initial Vocational Rehab Evaluation & Intervention   Assessment shows need for Vocational Rehabilitation No      Education: Education Goals: Education classes will be provided on a weekly basis, covering required topics. Participant will state understanding/return demonstration of topics presented.  Learning Barriers/Preferences:     Learning Barriers/Preferences - 07/25/15 1530    Learning Barriers/Preferences   Learning Barriers None   Learning Preferences None      Education Topics: General Nutrition Guidelines/Fats and Fiber: -Group instruction provided by verbal, written material, models and posters to present the general guidelines for heart healthy nutrition. Gives an explanation and review of dietary fats and fiber.   Controlling Sodium/Reading Food Labels: -Group verbal and written material supporting the  discussion of sodium use in heart healthy nutrition. Review and explanation with models, verbal and written materials for utilization of the food label.   Exercise Physiology & Risk Factors: - Group verbal and written instruction with models to review the exercise physiology of the cardiovascular system and associated critical values. Details cardiovascular disease risk factors and the goals associated with each risk factor.          Cardiac Rehab from 08/11/2015 in [facility] Cardiac and Pulmonary Rehab   Date  08/11/15   Educator  SW   Instruction Review Code  2- meets goals/outcomes      Aerobic Exercise & Resistance Training: - Gives group verbal and written discussion on the health impact of inactivity. On the components of aerobic and resistive training programs and the benefits of this training and how to safely progress through these programs.   Flexibility, Balance, General Exercise Guidelines: - Provides group verbal and written instruction on the benefits of flexibility and balance training programs. Provides general exercise guidelines with specific guidelines to those with heart or lung disease. Demonstration and skill practice provided.   Stress Management: - Provides group verbal and written instruction about the health risks of elevated stress, cause of high stress, and healthy ways to reduce stress.   Depression: - Provides group verbal and written instruction on the correlation between heart/lung disease and depressed mood, treatment options, and the stigmas associated with seeking treatment.   Anatomy & Physiology of the Heart: - Group verbal and written instruction and models provide basic cardiac anatomy and physiology, with the coronary electrical and arterial systems. Review of: AMI, Angina, Valve disease, Heart Failure, Cardiac Arrhythmia, Pacemakers, and the ICD.   Cardiac Procedures: - Group verbal and written instruction and models to describe the testing methods  done to diagnose heart disease. Reviews the outcomes of the test results. Describes the treatment choices: Medical Management, Angioplasty, or Coronary Bypass Surgery.   Cardiac Medications: - Group verbal and written instruction to review commonly prescribed medications for heart disease. Reviews the medication, class of the drug, and side effects. Includes the steps to properly store meds and maintain the prescription regimen.   Go Sex-Intimacy & Heart Disease, Get SMART - Goal Setting: - Group verbal and written instruction through game format to discuss heart disease and the return  to sexual intimacy. Provides group verbal and written material to discuss and apply goal setting through the application of the S.M.A.R.T. Method.   Other Matters of the Heart: - Provides group verbal, written materials and models to describe Heart Failure, Angina, Valve Disease, and Diabetes in the realm of heart disease. Includes description of the disease process and treatment options available to the cardiac patient.   Exercise & Equipment Safety: - Individual verbal instruction and demonstration of equipment use and safety with use of the equipment.      Cardiac Rehab from 08/11/2015 in [facility] Cardiac and Pulmonary Rehab   Date  07/25/15   Educator  C. Enterkin,RN   Instruction Review Code  1- partially meets, needs review/practice      Infection Prevention: - Provides verbal and written material to individual with discussion of infection control including proper hand washing and proper equipment cleaning during exercise session.      Cardiac Rehab from 08/11/2015 in [facility] Cardiac and Pulmonary Rehab   Date  07/25/15   Educator  C. Enterkin,RN   Instruction Review Code  2- meets goals/outcomes      Falls Prevention: - Provides verbal and written material to individual with discussion of falls prevention and safety.      Cardiac Rehab from 08/11/2015 in [facility] Cardiac and Pulmonary Rehab   Date  07/25/15    Educator  C. Enterkin,RN   Instruction Review Code  2- meets goals/outcomes      Diabetes: - Individual verbal and written instruction to review signs/symptoms of diabetes, desired ranges of glucose level fasting, after meals and with exercise. Advice that pre and post exercise glucose checks will be done for 3 sessions at entry of program.    Knowledge Questionnaire Score:     Knowledge Questionnaire Score - 07/25/15 1427    Knowledge Questionnaire Score   Pre Score 22      Personal Goals and Risk Factors at Admission:     Personal Goals and Risk Factors at Admission - 07/25/15 1531    Personal Goals and Risk Factors on Admission    Weight Management No   Increase Aerobic Exercise and Physical Activity Yes   Intervention While in program, learn and follow the exercise prescription taught. Start at a low level workload and increase workload after able to maintain previous level for 30 minutes. Increase time before increasing intensity.   Quit Smoking No   Understand more about Heart/Pulmonary Disease. Yes   Intervention While in program utilize professionals for any questions, and attend the education sessions. Great websites to use are www.americanheart.org or www.lung.org for reliable information.   Diabetes No   Hypertension Yes   Goal Participant will see blood pressure controlled within the values of 140/44mm/Hg or within value directed by their physician.   Intervention Provide nutrition & aerobic exercise along with prescribed medications to achieve BP 140/90 or less.   Lipids Yes   Goal Cholesterol controlled with medications as prescribed, with individualized exercise RX and with personalized nutrition plan. Value goals: LDL < , HDL > . Participant states understanding of desired cholesterol values and following prescriptions.   Intervention Provide nutrition & aerobic exercise along with prescribed medications to achieve LDL 70mg , HDL >40mg .   Stress Yes    Goal To meet with psychosocial counselor for stress and relaxation information and guidance. To state understanding of performing relaxation techniques and or identifying personal stressors.   Intervention Provide education on types of stress, identifiying stressors, and ways to  cope with stress. Provide demonstration and active practice of relaxation techniques.   Personal Goal Other No      Personal Goals and Risk Factors Review:      Goals and Risk Factor Review      09/08/15 1108 09/14/15 1417 10/12/15 1440       Increase Aerobic Exercise and Physical Activity   Goals Progress/Improvement seen  No No --  Kano has been out getting OT therapy for concerns with his Parkinson's. He plans to return to program this week.     Comments Jeramyah has been dealing with his Parkinson's disease and going to a neurologist who is going to send him for physical therapy. Has been out of Cardiac Rehab since 08/11/2015 Out a lot.      Hypertension   Goal --  Bp checked at his neurology appt lately.        Stress   Goal --  Ashwath has been dealing with his Parkinson's disease and going to a neurologist who is going to send him for physical therapy. Has been out of Cardiac Rehab since 08/11/2015          Personal Goals Discharge (Final Personal Goals and Risk Factors Review):      Goals and Risk Factor Review - 10/12/15 1440    Increase Aerobic Exercise and Physical Activity   Goals Progress/Improvement seen  --  Zebbie has been out getting OT therapy for concerns with his Parkinson's. He plans to return to program this week.      ITP Comments:     ITP Comments      11/08/15 1338 12/04/15 1332 12/21/15 0716 12/29/15 1312     ITP Comments 30 day review preparation  Continue with ITP  remains out for PT to help with Parkinson Disease Ready for 30 day review.  Continue with ITP  Remains out for medical reasons. Plans to return in Jan. Is checking Insurance before returns. Patient has not attended  since September 8th, 2016.  No changes warranted at this time. Ready for 30 day review. Continue with ITP. continues to be out for medical reasons       Comments:

## 2015-12-30 ENCOUNTER — Telehealth: Payer: Self-pay | Admitting: *Deleted

## 2015-12-30 NOTE — Telephone Encounter (Signed)
Called to check on status to return to program. Was not home, person ansering the phone will have Zeeshan call us back.

## 2016-01-04 NOTE — Addendum Note (Signed)
Addended by: Rudy Jew on: 01/04/2016 07:38 AM   Modules accepted: Orders

## 2016-01-16 ENCOUNTER — Encounter: Payer: Self-pay | Admitting: *Deleted

## 2016-01-24 ENCOUNTER — Telehealth: Payer: Self-pay | Admitting: *Deleted

## 2016-01-24 NOTE — Telephone Encounter (Signed)
Called to check on status to return to program. Planning to return to physical therapy for more Parkinson's Rehab.  Then to return to Cardiac Rehab

## 2016-01-24 NOTE — Progress Notes (Signed)
Cardiac Individual Treatment Plan  Patient Details  Name: Sean Barajas MRN: [id number] Date of Birth: 08-19-1956 Referring Provider:  Marcina Millard, MD  Initial Encounter Date:    Visit Diagnosis: Stented coronary artery - Plan: CARDIAC REHAB 30 DAY REVIEW, CARDIAC REHAB 30 DAY REVIEW, CARDIAC REHAB 30 DAY REVIEW  Patient's Home Medications on Admission:  Current outpatient prescriptions:  .  acetaminophen (TYLENOL) 500 MG tablet, Take 1,000 mg by mouth every 6 (six) hours as needed for mild pain., Disp: , Rfl:  .  albuterol (PROVENTIL HFA;VENTOLIN HFA) 108 (90 BASE) MCG/ACT inhaler, Inhale 2 puffs into the lungs every 6 (six) hours as needed for wheezing or shortness of breath., Disp: , Rfl:  .  ALPRAZolam (XANAX) 0.5 MG tablet, Take 0.5 mg by mouth 2 (two) times daily as needed for anxiety., Disp: , Rfl:  .  ARTIFICIAL TEAR OP, Apply 1 drop to eye as needed (for dry eyes)., Disp: , Rfl:  .  aspirin EC 81 MG tablet, Take 81 mg by mouth daily., Disp: , Rfl:  .  carbidopa-levodopa (SINEMET CR) 50-200 MG tablet, Take 1 tablet by mouth 2 (two) times daily. , Disp: , Rfl:  .  carbidopa-levodopa (SINEMET IR) 25-100 MG tablet, Take 1.5 tablets by mouth daily., Disp: , Rfl:  .  carvedilol (COREG) 3.125 MG tablet, Take 3.125 mg by mouth 2 (two) times daily., Disp: , Rfl:  .  nitroGLYCERIN (NITROSTAT) 0.4 MG SL tablet, Place 0.4 mg under the tongue every 5 (five) minutes as needed for chest pain., Disp: , Rfl:  .  rasagiline (AZILECT) 1 MG TABS tablet, Take 1 mg by mouth daily., Disp: , Rfl:  .  rosuvastatin (CRESTOR) 20 MG tablet, Take 20 mg by mouth at bedtime., Disp: , Rfl:  .  selegiline (ELDEPRYL) 5 MG capsule, Take 5 mg by mouth 2 (two) times daily with a meal., Disp: , Rfl:  .  sildenafil (VIAGRA) 100 MG tablet, Take 100 mg by mouth daily as needed for erectile dysfunction., Disp: , Rfl:  .  ticagrelor (BRILINTA) 90 MG TABS tablet, Take 90 mg by mouth every 12 (twelve) hours.,  Disp: , Rfl:  .  traZODone (DESYREL) 50 MG tablet, Take 50-75 mg by mouth at bedtime., Disp: , Rfl:  .  valACYclovir (VALTREX) 500 MG tablet, Take 500 mg by mouth 2 (two) times daily., Disp: , Rfl:   Past Medical History: Past Medical History  Diagnosis Date  . Coronary artery disease   . Hypertension   . Hyperlipidemia     Tobacco Use: History  Smoking status  . Never Smoker   Smokeless tobacco  . Not on file    Labs: Recent Review Flowsheet Data    There is no flowsheet data to display.       Exercise Target Goals:    Exercise Program Goal: Individual exercise prescription set with THRR, safety & activity barriers. Participant demonstrates ability to understand and report RPE using BORG scale, to self-measure pulse accurately, and to acknowledge the importance of the exercise prescription.  Exercise Prescription Goal: Starting with aerobic activity 30 plus minutes a day, 3 days per week for initial exercise prescription. Provide home exercise prescription and guidelines that participant acknowledges understanding prior to discharge.  Activity Barriers & Risk Stratification:   6 Minute Walk:   Initial Exercise Prescription:   Exercise Prescription Changes:     Exercise Prescription Changes      08/11/15 0900 08/11/15 1300 12/01/15 1500 01/16/16 1100  Exercise Review   Progression  Yes  No  Absent since 08/21/15    Response to Exercise   Blood Pressure (Admit)  118/74 mmHg      Blood Pressure (Exercise)  146/86 mmHg      Blood Pressure (Exit)  118/64 mmHg      Heart Rate (Admit)  62 bpm      Heart Rate (Exercise)  98 bpm      Heart Rate (Exit)  58 bpm      Rating of Perceived Exertion (Exercise)  12      Comments   patient has not attended since September.   Ex. Rx. will be re-evaluated upon return due to extended absence    Duration  Progress to 30 minutes of continuous aerobic without signs/symptoms of physical distress  Progress to 30 minutes of  continuous aerobic without signs/symptoms of physical distress    Intensity  Rest + 30  Rest + 30    Progression  Continue progressive overload as per policy without signs/symptoms or physical distress.  Continue progressive overload as per policy without signs/symptoms or physical distress.    Resistance Training   Training Prescription  Yes  Yes    Weight  4  4    Reps  10-15  10-15    Interval Training   Interval Training  No  No    Treadmill   MPH  3  3    Grade  0  0    Minutes  15  15    Recumbant Bike   Level 4 4  4     RPM 65 65  65    Minutes 15 15  15     NuStep   Level  4  4    Watts  50  50    Minutes  15  15       Discharge Exercise Prescription (Final Exercise Prescription Changes):     Exercise Prescription Changes - 01/16/16 1100    Exercise Review   Progression No  Absent since 08/21/15   Response to Exercise   Comments Ex. Rx. will be re-evaluated upon return due to extended absence   Duration Progress to 30 minutes of continuous aerobic without signs/symptoms of physical distress   Intensity Rest + 30   Progression Continue progressive overload as per policy without signs/symptoms or physical distress.   Resistance Training   Training Prescription Yes   Weight 4   Reps 10-15   Interval Training   Interval Training No   Treadmill   MPH 3   Grade 0   Minutes 15   Recumbant Bike   Level 4   RPM 65   Minutes 15   NuStep   Level 4   Watts 50   Minutes 15      Nutrition:  Target Goals: Understanding of nutrition guidelines, daily intake of sodium 1500mg , cholesterol 200mg , calories 30% from fat and 7% or less from saturated fats, daily to have 5 or more servings of fruits and vegetables.  Biometrics:    Nutrition Therapy Plan and Nutrition Goals:   Nutrition Discharge: Rate Your Plate Scores:   Nutrition Goals Re-Evaluation:     Nutrition Goals Re-Evaluation      09/08/15 1107           Personal Goal #1 Re-Evaluation    Personal Goal #1 Sean Barajas has been dealing with his Parkinson's disease and going to a neurologist who is going to send him for physical therapy.  Has been out of Cardiac Rehab since 08/11/2015       Goal Progress Seen No          Psychosocial: Target Goals: Acknowledge presence or absence of depression, maximize coping skills, provide positive support system. Participant is able to verbalize types and ability to use techniques and skills needed for reducing stress and depression.  Initial Review & Psychosocial Screening:   Quality of Life Scores:   PHQ-9:     Recent Review Flowsheet Data    Depression screen [facility] 2/9 07/25/2015   Decreased Interest 2   Down, Depressed, Hopeless 0   PHQ - 2 Score 2   Altered sleeping 3   Tired, decreased energy 3   Change in appetite 0   Feeling bad or failure about yourself  0   Trouble concentrating 0   Moving slowly or fidgety/restless 3   Suicidal thoughts 0   PHQ-9 Score 11   Difficult doing work/chores Somewhat difficult       Psychosocial Evaluation and Intervention:   Psychosocial Re-Evaluation:     Psychosocial Re-Evaluation      09/08/15 1109           Psychosocial Re-Evaluation   Interventions Encouraged to attend Cardiac Rehabilitation for the exercise;Stress management education       Comments Stress due to "late stage Parkinson's disease Naythen reports". Enoch has been dealing with his Parkinson's disease and going to a neurologist who is going to send him for physical therapy. Has been out of Cardiac Rehab since 08/11/2015          Vocational Rehabilitation: Provide vocational rehab assistance to qualifying candidates.   Vocational Rehab Evaluation & Intervention:   Education: Education Goals: Education classes will be provided on a weekly basis, covering required topics. Participant will state understanding/return demonstration of topics presented.  Learning Barriers/Preferences:   Education Topics: General  Nutrition Guidelines/Fats and Fiber: -Group instruction provided by verbal, written material, models and posters to present the general guidelines for heart healthy nutrition. Gives an explanation and review of dietary fats and fiber.   Controlling Sodium/Reading Food Labels: -Group verbal and written material supporting the discussion of sodium use in heart healthy nutrition. Review and explanation with models, verbal and written materials for utilization of the food label.   Exercise Physiology & Risk Factors: - Group verbal and written instruction with models to review the exercise physiology of the cardiovascular system and associated critical values. Details cardiovascular disease risk factors and the goals associated with each risk factor.          Cardiac Rehab from 08/11/2015 in [facility] Cardiac and Pulmonary Rehab   Date  08/11/15   Educator  SW   Instruction Review Code  2- meets goals/outcomes      Aerobic Exercise & Resistance Training: - Gives group verbal and written discussion on the health impact of inactivity. On the components of aerobic and resistive training programs and the benefits of this training and how to safely progress through these programs.   Flexibility, Balance, General Exercise Guidelines: - Provides group verbal and written instruction on the benefits of flexibility and balance training programs. Provides general exercise guidelines with specific guidelines to those with heart or lung disease. Demonstration and skill practice provided.   Stress Management: - Provides group verbal and written instruction about the health risks of elevated stress, cause of high stress, and healthy ways to reduce stress.   Depression: - Provides group verbal and written instruction on the  correlation between heart/lung disease and depressed mood, treatment options, and the stigmas associated with seeking treatment.   Anatomy & Physiology of the Heart: - Group verbal and  written instruction and models provide basic cardiac anatomy and physiology, with the coronary electrical and arterial systems. Review of: AMI, Angina, Valve disease, Heart Failure, Cardiac Arrhythmia, Pacemakers, and the ICD.   Cardiac Procedures: - Group verbal and written instruction and models to describe the testing methods done to diagnose heart disease. Reviews the outcomes of the test results. Describes the treatment choices: Medical Management, Angioplasty, or Coronary Bypass Surgery.   Cardiac Medications: - Group verbal and written instruction to review commonly prescribed medications for heart disease. Reviews the medication, class of the drug, and side effects. Includes the steps to properly store meds and maintain the prescription regimen.   Go Sex-Intimacy & Heart Disease, Get SMART - Goal Setting: - Group verbal and written instruction through game format to discuss heart disease and the return to sexual intimacy. Provides group verbal and written material to discuss and apply goal setting through the application of the S.M.A.R.T. Method.   Other Matters of the Heart: - Provides group verbal, written materials and models to describe Heart Failure, Angina, Valve Disease, and Diabetes in the realm of heart disease. Includes description of the disease process and treatment options available to the cardiac patient.   Exercise & Equipment Safety: - Individual verbal instruction and demonstration of equipment use and safety with use of the equipment.      Cardiac Rehab from 08/11/2015 in [facility] Cardiac and Pulmonary Rehab   Date  07/25/15   Educator  C. Enterkin,RN   Instruction Review Code  1- partially meets, needs review/practice      Infection Prevention: - Provides verbal and written material to individual with discussion of infection control including proper hand washing and proper equipment cleaning during exercise session.      Cardiac Rehab from 08/11/2015 in [facility] Cardiac  and Pulmonary Rehab   Date  07/25/15   Educator  C. Enterkin,RN   Instruction Review Code  2- meets goals/outcomes      Falls Prevention: - Provides verbal and written material to individual with discussion of falls prevention and safety.      Cardiac Rehab from 08/11/2015 in [facility] Cardiac and Pulmonary Rehab   Date  07/25/15   Educator  C. Enterkin,RN   Instruction Review Code  2- meets goals/outcomes      Diabetes: - Individual verbal and written instruction to review signs/symptoms of diabetes, desired ranges of glucose level fasting, after meals and with exercise. Advice that pre and post exercise glucose checks will be done for 3 sessions at entry of program.    Knowledge Questionnaire Score:   Personal Goals and Risk Factors at Admission:   Personal Goals and Risk Factors Review:      Goals and Risk Factor Review      09/08/15 1108 09/14/15 1417 10/12/15 1440 01/16/16 1113     Increase Aerobic Exercise and Physical Activity   Goals Progress/Improvement seen  No No --  Huel has been out getting OT therapy for concerns with his Parkinson's. He plans to return to program this week. No    Comments Jeston has been dealing with his Parkinson's disease and going to a neurologist who is going to send him for physical therapy. Has been out of Cardiac Rehab since 08/11/2015 Out a lot.  Ex. Rx. will be re-evaluated upon return due to extended absence  Hypertension   Goal --  Bp checked at his neurology appt lately.        Stress   Goal --  Dyllin has been dealing with his Parkinson's disease and going to a neurologist who is going to send him for physical therapy. Has been out of Cardiac Rehab since 08/11/2015          Personal Goals Discharge (Final Personal Goals and Risk Factors Review):      Goals and Risk Factor Review - 01/16/16 1113    Increase Aerobic Exercise and Physical Activity   Goals Progress/Improvement seen  No   Comments Ex. Rx. will be re-evaluated upon  return due to extended absence      ITP Comments:     ITP Comments      11/08/15 1338 12/04/15 1332 12/21/15 0716 12/29/15 1312 01/24/16 0841   ITP Comments 30 day review preparation  Continue with ITP  remains out for PT to help with Parkinson Disease Ready for 30 day review.  Continue with ITP  Remains out for medical reasons. Plans to return in Jan. Is checking Insurance before returns. Patient has not attended since September 8th, 2016.  No changes warranted at this time. Ready for 30 day review. Continue with ITP. continues to be out for medical reasons 30 Day Review.  Continue with ITP. Remains absent, no contact with program      Comments:

## 2016-02-01 NOTE — Addendum Note (Signed)
Addended by: Rudy Jew on: 02/01/2016 07:29 AM   Modules accepted: Orders

## 2016-02-13 ENCOUNTER — Encounter: Payer: Self-pay | Admitting: *Deleted

## 2016-02-16 ENCOUNTER — Encounter: Payer: Self-pay | Admitting: *Deleted

## 2016-02-16 DIAGNOSIS — Z955 Presence of coronary angioplasty implant and graft: Secondary | ICD-10-CM

## 2016-02-16 NOTE — Progress Notes (Signed)
Cardiac Individual Treatment Plan  Patient Details  Name: Sean Barajas MRN: [id number] Date of Birth: December 29, 1955 Referring Provider:  No ref. provider found  Initial Encounter Date:       Cardiac Rehab from 07/25/2015 in [facility] Cardiac and Pulmonary Rehab   Date  07/25/15      Visit Diagnosis: Stented coronary artery  Patient's Home Medications on Admission:  Current outpatient prescriptions:  .  acetaminophen (TYLENOL) 500 MG tablet, Take 1,000 mg by mouth every 6 (six) hours as needed for mild pain., Disp: , Rfl:  .  albuterol (PROVENTIL HFA;VENTOLIN HFA) 108 (90 BASE) MCG/ACT inhaler, Inhale 2 puffs into the lungs every 6 (six) hours as needed for wheezing or shortness of breath., Disp: , Rfl:  .  ALPRAZolam (XANAX) 0.5 MG tablet, Take 0.5 mg by mouth 2 (two) times daily as needed for anxiety., Disp: , Rfl:  .  ARTIFICIAL TEAR OP, Apply 1 drop to eye as needed (for dry eyes)., Disp: , Rfl:  .  aspirin EC 81 MG tablet, Take 81 mg by mouth daily., Disp: , Rfl:  .  carbidopa-levodopa (SINEMET CR) 50-200 MG tablet, Take 1 tablet by mouth 2 (two) times daily. , Disp: , Rfl:  .  carbidopa-levodopa (SINEMET IR) 25-100 MG tablet, Take 1.5 tablets by mouth daily., Disp: , Rfl:  .  carvedilol (COREG) 3.125 MG tablet, Take 3.125 mg by mouth 2 (two) times daily., Disp: , Rfl:  .  nitroGLYCERIN (NITROSTAT) 0.4 MG SL tablet, Place 0.4 mg under the tongue every 5 (five) minutes as needed for chest pain., Disp: , Rfl:  .  rasagiline (AZILECT) 1 MG TABS tablet, Take 1 mg by mouth daily., Disp: , Rfl:  .  rosuvastatin (CRESTOR) 20 MG tablet, Take 20 mg by mouth at bedtime., Disp: , Rfl:  .  selegiline (ELDEPRYL) 5 MG capsule, Take 5 mg by mouth 2 (two) times daily with a meal., Disp: , Rfl:  .  sildenafil (VIAGRA) 100 MG tablet, Take 100 mg by mouth daily as needed for erectile dysfunction., Disp: , Rfl:  .  ticagrelor (BRILINTA) 90 MG TABS tablet, Take 90 mg by mouth every 12 (twelve) hours., Disp:  , Rfl:  .  traZODone (DESYREL) 50 MG tablet, Take 50-75 mg by mouth at bedtime., Disp: , Rfl:  .  valACYclovir (VALTREX) 500 MG tablet, Take 500 mg by mouth 2 (two) times daily., Disp: , Rfl:   Past Medical History: Past Medical History  Diagnosis Date  . Coronary artery disease   . Hypertension   . Hyperlipidemia     Tobacco Use: History  Smoking status  . Never Smoker   Smokeless tobacco  . Not on file    Labs: Recent Review Flowsheet Data    There is no flowsheet data to display.       Exercise Target Goals:    Exercise Program Goal: Individual exercise prescription set with THRR, safety & activity barriers. Participant demonstrates ability to understand and report RPE using BORG scale, to self-measure pulse accurately, and to acknowledge the importance of the exercise prescription.  Exercise Prescription Goal: Starting with aerobic activity 30 plus minutes a day, 3 days per week for initial exercise prescription. Provide home exercise prescription and guidelines that participant acknowledges understanding prior to discharge.  Activity Barriers & Risk Stratification:   6 Minute Walk:   Initial Exercise Prescription:   Perform Capillary Blood Glucose checks as needed.  Exercise Prescription Changes:     Exercise Prescription Changes  12/01/15 1500 01/16/16 1100 02/13/16 1200       Exercise Review   Progression  No  Absent since 08/21/15 No  Absent since 08/21/15     Response to Exercise   Comments patient has not attended since September.   Ex. Rx. will be re-evaluated upon return due to extended absence Ex. Rx. will be re-evaluated upon return due to extended absence     Duration  Progress to 30 minutes of continuous aerobic without signs/symptoms of physical distress Progress to 30 minutes of continuous aerobic without signs/symptoms of physical distress     Intensity  Rest + 30 Rest + 30     Progression   Progression  Continue progressive overload  as per policy without signs/symptoms or physical distress. Continue progressive overload as per policy without signs/symptoms or physical distress.     Resistance Training   Training Prescription (read-only)  Yes      Weight (read-only)  4      Reps (read-only)  10-15      Interval Training   Interval Training  No No     Treadmill   MPH (read-only)  3      Grade (read-only)  0      Minutes (read-only)  15      Recumbant Bike   Level (read-only)  4      RPM (read-only)  65      Minutes (read-only)  15      NuStep   Level (read-only)  4      Watts (read-only)  50      Minutes (read-only)  15         Exercise Comments:   Discharge Exercise Prescription (Final Exercise Prescription Changes):     Exercise Prescription Changes - 02/13/16 1200    Exercise Review   Progression No  Absent since 08/21/15   Response to Exercise   Comments Ex. Rx. will be re-evaluated upon return due to extended absence   Duration Progress to 30 minutes of continuous aerobic without signs/symptoms of physical distress   Intensity Rest + 30   Progression   Progression Continue progressive overload as per policy without signs/symptoms or physical distress.   Interval Training   Interval Training No      Nutrition:  Target Goals: Understanding of nutrition guidelines, daily intake of sodium 1500mg , cholesterol 200mg , calories 30% from fat and 7% or less from saturated fats, daily to have 5 or more servings of fruits and vegetables.  Biometrics:    Nutrition Therapy Plan and Nutrition Goals:   Nutrition Discharge: Rate Your Plate Scores:   Nutrition Goals Re-Evaluation:     Nutrition Goals Re-Evaluation      09/08/15 1107 02/16/16 1530         Personal Goal #1 Re-Evaluation   Personal Goal #1 Sean Barajas MaduroRobert has been dealing with his Parkinson's disease and going to a neurologist who is going to send him for physical therapy. Has been out of Cardiac Rehab since 08/11/2015       Goal Progress  Seen No       Comments  Attended only 6/36 sessions. Last time was Sept 2016.          Psychosocial: Target Goals: Acknowledge presence or absence of depression, maximize coping skills, provide positive support system. Participant is able to verbalize types and ability to use techniques and skills needed for reducing stress and depression.  Initial Review & Psychosocial Screening:   Quality of Life Scores:  PHQ-9:     Recent Review Flowsheet Data    Depression screen [facility] 2/9 07/25/2015   Decreased Interest 2   Down, Depressed, Hopeless 0   PHQ - 2 Score 2   Altered sleeping 3   Tired, decreased energy 3   Change in appetite 0   Feeling bad or failure about yourself  0   Trouble concentrating 0   Moving slowly or fidgety/restless 3   Suicidal thoughts 0   PHQ-9 Score 11   Difficult doing work/chores Somewhat difficult       Psychosocial Evaluation and Intervention:   Psychosocial Re-Evaluation:     Psychosocial Re-Evaluation      09/08/15 1109 02/16/16 1531         Psychosocial Re-Evaluation   Interventions Encouraged to attend Cardiac Rehabilitation for the exercise;Stress management education Physician referral      Comments Stress due to "late stage Parkinson's disease Kaiyan reports". Kayveon has been dealing with his Parkinson's disease and going to a neurologist who is going to send him for physical therapy. Has been out of Cardiac Rehab since 08/11/2015 Attended only 6/36 sessions. Last time was Sept 2016.          Vocational Rehabilitation: Provide vocational rehab assistance to qualifying candidates.   Vocational Rehab Evaluation & Intervention:   Education: Education Goals: Education classes will be provided on a weekly basis, covering required topics. Participant will state understanding/return demonstration of topics presented.  Learning Barriers/Preferences:   Education Topics: General Nutrition Guidelines/Fats and Fiber: -Group instruction  provided by verbal, written material, models and posters to present the general guidelines for heart healthy nutrition. Gives an explanation and review of dietary fats and fiber.   Controlling Sodium/Reading Food Labels: -Group verbal and written material supporting the discussion of sodium use in heart healthy nutrition. Review and explanation with models, verbal and written materials for utilization of the food label.   Exercise Physiology & Risk Factors: - Group verbal and written instruction with models to review the exercise physiology of the cardiovascular system and associated critical values. Details cardiovascular disease risk factors and the goals associated with each risk factor.          Cardiac Rehab from 08/11/2015 in [facility] Cardiac and Pulmonary Rehab   Date  08/11/15   Educator  SW   Instruction Review Code  2- meets goals/outcomes      Aerobic Exercise & Resistance Training: - Gives group verbal and written discussion on the health impact of inactivity. On the components of aerobic and resistive training programs and the benefits of this training and how to safely progress through these programs.   Flexibility, Balance, General Exercise Guidelines: - Provides group verbal and written instruction on the benefits of flexibility and balance training programs. Provides general exercise guidelines with specific guidelines to those with heart or lung disease. Demonstration and skill practice provided.   Stress Management: - Provides group verbal and written instruction about the health risks of elevated stress, cause of high stress, and healthy ways to reduce stress.   Depression: - Provides group verbal and written instruction on the correlation between heart/lung disease and depressed mood, treatment options, and the stigmas associated with seeking treatment.   Anatomy & Physiology of the Heart: - Group verbal and written instruction and models provide basic cardiac  anatomy and physiology, with the coronary electrical and arterial systems. Review of: AMI, Angina, Valve disease, Heart Failure, Cardiac Arrhythmia, Pacemakers, and the ICD.   Cardiac Procedures: - Group  verbal and written instruction and models to describe the testing methods done to diagnose heart disease. Reviews the outcomes of the test results. Describes the treatment choices: Medical Management, Angioplasty, or Coronary Bypass Surgery.   Cardiac Medications: - Group verbal and written instruction to review commonly prescribed medications for heart disease. Reviews the medication, class of the drug, and side effects. Includes the steps to properly store meds and maintain the prescription regimen.   Go Sex-Intimacy & Heart Disease, Get SMART - Goal Setting: - Group verbal and written instruction through game format to discuss heart disease and the return to sexual intimacy. Provides group verbal and written material to discuss and apply goal setting through the application of the S.M.A.R.T. Method.   Other Matters of the Heart: - Provides group verbal, written materials and models to describe Heart Failure, Angina, Valve Disease, and Diabetes in the realm of heart disease. Includes description of the disease process and treatment options available to the cardiac patient.   Exercise & Equipment Safety: - Individual verbal instruction and demonstration of equipment use and safety with use of the equipment.      Cardiac Rehab from 08/11/2015 in [facility] Cardiac and Pulmonary Rehab   Date  07/25/15   Educator  C. Taisia Fantini,RN   Instruction Review Code  1- partially meets, needs review/practice      Infection Prevention: - Provides verbal and written material to individual with discussion of infection control including proper hand washing and proper equipment cleaning during exercise session.      Cardiac Rehab from 08/11/2015 in [facility] Cardiac and Pulmonary Rehab   Date  07/25/15   Educator  C.  Roper Tolson,RN   Instruction Review Code  2- meets goals/outcomes      Falls Prevention: - Provides verbal and written material to individual with discussion of falls prevention and safety.      Cardiac Rehab from 08/11/2015 in [facility] Cardiac and Pulmonary Rehab   Date  07/25/15   Educator  C. Kennedy Brines,RN   Instruction Review Code  2- meets goals/outcomes      Diabetes: - Individual verbal and written instruction to review signs/symptoms of diabetes, desired ranges of glucose level fasting, after meals and with exercise. Advice that pre and post exercise glucose checks will be done for 3 sessions at entry of program.    Knowledge Questionnaire Score:   Personal Goals and Risk Factors at Admission:   Personal Goals and Risk Factors Review:      Goals and Risk Factor Review      09/08/15 1108 09/14/15 1417 10/12/15 0647 10/12/15 1440 01/16/16 1113   Core Components/Risk Factors/Patient Goals Review   Personal Goals Review Increase Aerobic Exercise and Physical Activity  --  Sean Barajas Maduro ahs been out for OT therapy for his PArkinson's Disease. He will return to program this week.     Increase Aerobic Exercise and Physical Activity (read-only)   Goals Progress/Improvement seen  No No  --  Braxley has been out getting OT therapy for concerns with his Parkinson's. He plans to return to program this week. No   Comments Jerrad has been dealing with his Parkinson's disease and going to a neurologist who is going to send him for physical therapy. Has been out of Cardiac Rehab since 08/11/2015 Out a lot.   Ex. Rx. will be re-evaluated upon return due to extended absence   Hypertension (read-only)   Goal --  Bp checked at his neurology appt lately.        Stress (  read-only)   Goal --  Faheem has been dealing with his Parkinson's disease and going to a neurologist who is going to send him for physical therapy. Has been out of Cardiac Rehab since 08/11/2015         02/16/16 1531           Core  Components/Risk Factors/Patient Goals Review   Personal Goals Review Sedentary       Review Attended only 6/36 sessions. Last time was Sept 2016.        Expected Outcomes Will discharge since Edword was following up with his MD and Physical therapy.           Personal Goals Discharge (Final Personal Goals and Risk Factors Review):      Goals and Risk Factor Review - 02/16/16 1531    Core Components/Risk Factors/Patient Goals Review   Personal Goals Review Sedentary   Review Attended only 6/36 sessions. Last time was Sept 2016.    Expected Outcomes Will discharge since Haruo was following up with his MD and Physical therapy.       ITP Comments:     ITP Comments      11/08/15 1338 12/04/15 1332 12/21/15 0716 12/29/15 1312 01/24/16 0841   ITP Comments 30 day review preparation  Continue with ITP  remains out for PT to help with Parkinson Disease Ready for 30 day review.  Continue with ITP  Remains out for medical reasons. Plans to return in Jan. Is checking Insurance before returns. Patient has not attended since September 8th, 2016.  No changes warranted at this time. Ready for 30 day review. Continue with ITP. continues to be out for medical reasons 30 Day Review.  Continue with ITP. Remains absent, no contact with program     02/16/16 1530           ITP Comments Attended only 6/36 sessions. Last time was Sept 2016.           Comments:

## 2016-02-16 NOTE — Progress Notes (Signed)
Discharge Summary  Patient Details  Name: Sean Barajas MRN: [id number] Date of Birth: 05/13/1956 Referring Provider:  No ref. provider found   Number of Visits: 6/36 last one Sept 2016  Reason for Discharge:  Early Exit:  Personal  Smoking History:  History  Smoking status  . Never Smoker   Smokeless tobacco  . Not on file    Diagnosis:  Stented coronary artery  ADL UCSD:   Initial Exercise Prescription:   Discharge Exercise Prescription (Final Exercise Prescription Changes):     Exercise Prescription Changes - 02/13/16 1200    Exercise Review   Progression No  Absent since 08/21/15   Response to Exercise   Comments Ex. Rx. will be re-evaluated upon return due to extended absence   Duration Progress to 30 minutes of continuous aerobic without signs/symptoms of physical distress   Intensity Rest + 30   Progression   Progression Continue progressive overload as per policy without signs/symptoms or physical distress.   Interval Training   Interval Training No      Functional Capacity:   Psychological, QOL, Others - Outcomes: PHQ 2/9: Depression screen PHQ 2/9 07/25/2015  Decreased Interest 2  Down, Depressed, Hopeless 0  PHQ - 2 Score 2  Altered sleeping 3  Tired, decreased energy 3  Change in appetite 0  Feeling bad or failure about yourself  0  Trouble concentrating 0  Moving slowly or fidgety/restless 3  Suicidal thoughts 0  PHQ-9 Score 11  Difficult doing work/chores Somewhat difficult    Quality of Life:   Personal Goals: Goals established at orientation with interventions provided to work toward goal.    Personal Goals Discharge:     Goals and Risk Factor Review      09/08/15 1108 09/14/15 1417 10/12/15 0647 10/12/15 1440 01/16/16 1113   Core Components/Risk Factors/Patient Goals Review   Personal Goals Review Increase Aerobic Exercise and Physical Activity  --  Molly Maduroobert ahs been out for OT therapy for his PArkinson's Disease. He  will return to program this week.     Increase Aerobic Exercise and Physical Activity (read-only)   Goals Progress/Improvement seen  No No  --  [name] has been out getting OT therapy for concerns with his Parkinson's. He plans to return to program this week. No   Comments [name] has been dealing with his Parkinson's disease and going to a neurologist who is going to send him for physical therapy. Has been out of Cardiac Rehab since 08/11/2015 Out a lot.   Ex. Rx. will be re-evaluated upon return due to extended absence   Hypertension (read-only)   Goal --  Bp checked at his neurology appt lately.        Stress (read-only)   Goal --  [name] has been dealing with his Parkinson's disease and going to a neurologist who is going to send him for physical therapy. Has been out of Cardiac Rehab since 08/11/2015         02/16/16 1531           Core Components/Risk Factors/Patient Goals Review   Personal Goals Review Sedentary       Review Attended only 6/36 sessions. Last time was Sept 2016.        Expected Outcomes Will discharge since [name] was following up with his MD and Physical therapy.           Nutrition & Weight - Outcomes:    Nutrition:   Nutrition Discharge:   Education Questionnaire Score:  Goals reviewed with patient; copy given to patient.

## 2016-03-06 ENCOUNTER — Ambulatory Visit (INDEPENDENT_AMBULATORY_CARE_PROVIDER_SITE_OTHER): Payer: BLUE CROSS/BLUE SHIELD | Admitting: Family Medicine

## 2016-03-06 ENCOUNTER — Encounter: Payer: Self-pay | Admitting: Family Medicine

## 2016-03-06 VITALS — BP 120/80 | HR 76 | Ht 71.0 in | Wt 217.0 lb

## 2016-03-06 DIAGNOSIS — R5383 Other fatigue: Secondary | ICD-10-CM | POA: Diagnosis not present

## 2016-03-06 DIAGNOSIS — R5381 Other malaise: Secondary | ICD-10-CM | POA: Diagnosis not present

## 2016-03-06 LAB — POCT URINALYSIS DIPSTICK
Bilirubin, UA: NEGATIVE
GLUCOSE UA: NEGATIVE
Ketones, UA: NEGATIVE
Leukocytes, UA: NEGATIVE
NITRITE UA: NEGATIVE
RBC UA: NEGATIVE
Spec Grav, UA: 1.01
UROBILINOGEN UA: 0.2
pH, UA: 6

## 2016-03-06 LAB — GLUCOSE, POCT (MANUAL RESULT ENTRY): POC GLUCOSE: 135 mg/dL — AB (ref 70–99)

## 2016-03-06 LAB — HEMOCCULT GUIAC POC 1CARD (OFFICE): FECAL OCCULT BLD: NEGATIVE

## 2016-03-06 NOTE — Progress Notes (Signed)
Name: Sean Barajas   MRN: [id number]    DOB: 07/18/1956   Date:03/06/2016       Progress Note  Subjective  Chief Complaint  Chief Complaint  Patient presents with  . Diabetes    wants to see if he has diabetes- brother has it. Feels tired and weak until eating. Trying to watch weight, so he has cut out sugar    Diabetes He presents for his initial diabetic visit. Hypoglycemia symptoms include nervousness/anxiousness. Pertinent negatives for hypoglycemia include no confusion, dizziness, headaches, pallor, seizures, sleepiness or tremors. Associated symptoms include fatigue, polydipsia and weakness. Pertinent negatives for diabetes include no blurred vision, no chest pain, no polyphagia, no polyuria, no visual change and no weight loss. Pertinent negatives for hypoglycemia complications include no blackouts, no hospitalization, no nocturnal hypoglycemia, no required assistance and no required glucagon injection. Symptoms are stable. There are no diabetic complications. Pertinent negatives for diabetic complications include no autonomic neuropathy, CVA, heart disease, nephropathy, peripheral neuropathy or retinopathy. There are no known risk factors for coronary artery disease. Current diabetic treatment includes diet. He is following a generally healthy diet. He does not see a podiatrist.Eye exam is not current.  Thyroid Problem Presents for follow-up visit. Symptoms include anxiety, cold intolerance, constipation, depressed mood and fatigue. Patient reports no diaphoresis, diarrhea, leg swelling, palpitations, tremors, visual change or weight loss. The symptoms have been stable.    No problem-specific assessment & plan notes found for this encounter.   Past Medical History  Diagnosis Date  . Coronary artery disease   . Hypertension   . Hyperlipidemia     Past Surgical History  Procedure Laterality Date  . Cardiac catheterization    . Coronary angioplasty    . Coronary artery bypass  graft      History reviewed. No pertinent family history.  Social History   Social History  . Marital Status: Single    Spouse Name: N/A  . Number of Children: N/A  . Years of Education: N/A   Occupational History  . Not on file.   Social History Main Topics  . Smoking status: Never Smoker   . Smokeless tobacco: Not on file  . Alcohol Use: No  . Drug Use: Not on file  . Sexual Activity: Not on file   Other Topics Concern  . Not on file   Social History Narrative    Allergies  Allergen Reactions  . Iodinated Diagnostic Agents Nausea Only and Other (See Comments)    Reaction:  Fever   . Sulfa Antibiotics Rash     Review of Systems  Constitutional: Positive for malaise/fatigue and fatigue. Negative for fever, weight loss and diaphoresis.  HENT: Negative for congestion, ear discharge, ear pain, hearing loss, nosebleeds, sore throat and tinnitus.   Eyes: Negative for blurred vision, double vision, photophobia, pain, discharge and redness.  Respiratory: Negative for cough, hemoptysis, sputum production, shortness of breath, wheezing and stridor.   Cardiovascular: Negative for chest pain, palpitations, orthopnea, claudication, leg swelling and PND.  Gastrointestinal: Positive for constipation and blood in stool. Negative for heartburn, nausea, vomiting, abdominal pain, diarrhea and melena.  Genitourinary: Positive for hematuria. Negative for dysuria, urgency, frequency and flank pain.  Musculoskeletal: Negative for myalgias, back pain, joint pain, falls and neck pain.  Skin: Negative for itching, pallor and rash.  Neurological: Positive for weakness. Negative for dizziness, tingling, tremors, sensory change, speech change, focal weakness, seizures, loss of consciousness and headaches.  Endo/Heme/Allergies: Positive for cold intolerance and polydipsia.  Negative for polyphagia.  Psychiatric/Behavioral: Negative for depression, suicidal ideas, hallucinations, memory loss,  confusion and substance abuse. The patient is nervous/anxious. The patient does not have insomnia.      Objective  Filed Vitals:   03/06/16 1403  BP: 120/80  Pulse: 76  Height:  (1.803 m)  Weight: 217 lb (98.431 kg)    Physical Exam  Constitutional: He is oriented to person, place, and time and well-developed, well-nourished, and in no distress.  HENT:  Head: Normocephalic.  Right Ear: External ear normal.  Left Ear: External ear normal.  Nose: Nose normal.  Mouth/Throat: Oropharynx is clear and moist.  Eyes: Conjunctivae and EOM are normal. Pupils are equal, round, and reactive to light. Right eye exhibits no discharge. Left eye exhibits no discharge. No scleral icterus.  Neck: Normal range of motion. Neck supple. No JVD present. No tracheal deviation present. No thyromegaly present.  Cardiovascular: Normal rate, regular rhythm, normal heart sounds and intact distal pulses.  Exam reveals no gallop and no friction rub.   No murmur heard. Pulmonary/Chest: Breath sounds normal. No respiratory distress. He has no wheezes. He has no rales.  Abdominal: Soft. Bowel sounds are normal. He exhibits no mass. There is no hepatosplenomegaly. There is no tenderness. There is no rebound, no guarding and no CVA tenderness.  Genitourinary: Rectum normal.  Musculoskeletal: Normal range of motion. He exhibits no edema or tenderness.  Lymphadenopathy:    He has no cervical adenopathy.  Neurological: He is alert and oriented to person, place, and time. He has normal sensation, normal strength and intact cranial nerves. No cranial nerve deficit.  Skin: Skin is warm. No rash noted.  Psychiatric: Mood and affect normal.  Nursing note and vitals reviewed.     Assessment & Plan  Problem List Items Addressed This Visit    None    Visit Diagnoses    Malaise and fatigue    -  Primary    Relevant Orders    CBC w/Diff/Platelet    TSH    Hemoglobin A1c    POCT Occult Blood Stool (Completed)     POCT urinalysis dipstick (Completed)    POCT Glucose (CBG) (Completed)    Renal Function Panel         Dr. Elizabeth Sauer [facility]  03/06/2016

## 2016-03-07 LAB — CBC WITH DIFFERENTIAL/PLATELET
BASOS ABS: 0 10*3/uL (ref 0.0–0.2)
Basos: 1 %
EOS (ABSOLUTE): 0.1 10*3/uL (ref 0.0–0.4)
Eos: 3 %
Hematocrit: 43.2 % (ref 37.5–51.0)
Hemoglobin: 14.9 g/dL (ref 12.6–17.7)
IMMATURE GRANS (ABS): 0 10*3/uL (ref 0.0–0.1)
IMMATURE GRANULOCYTES: 0 %
LYMPHS: 25 %
Lymphocytes Absolute: 0.9 10*3/uL (ref 0.7–3.1)
MCH: 32.2 pg (ref 26.6–33.0)
MCHC: 34.5 g/dL (ref 31.5–35.7)
MCV: 93 fL (ref 79–97)
Monocytes Absolute: 0.4 10*3/uL (ref 0.1–0.9)
Monocytes: 12 %
NEUTROS PCT: 59 %
Neutrophils Absolute: 2 10*3/uL (ref 1.4–7.0)
PLATELETS: 142 10*3/uL — AB (ref 150–379)
RBC: 4.63 x10E6/uL (ref 4.14–5.80)
RDW: 13.6 % (ref 12.3–15.4)
WBC: 3.4 10*3/uL (ref 3.4–10.8)

## 2016-03-07 LAB — RENAL FUNCTION PANEL
ALBUMIN: 4.7 g/dL (ref 3.6–4.8)
BUN/Creatinine Ratio: 14 (ref 10–24)
BUN: 13 mg/dL (ref 8–27)
CO2: 23 mmol/L (ref 18–29)
CREATININE: 0.9 mg/dL (ref 0.76–1.27)
Calcium: 9.4 mg/dL (ref 8.6–10.2)
Chloride: 99 mmol/L (ref 96–106)
GFR, EST AFRICAN AMERICAN: 106 mL/min/{1.73_m2} (ref >59)
GFR, EST NON AFRICAN AMERICAN: 92 mL/min/{1.73_m2} (ref >59)
GLUCOSE: 110 mg/dL — AB (ref 65–99)
POTASSIUM: 4.2 mmol/L (ref 3.5–5.2)
Phosphorus: 3.6 mg/dL (ref 2.5–4.5)
Sodium: 139 mmol/L (ref 134–144)

## 2016-03-07 LAB — HEMOGLOBIN A1C
ESTIMATED AVERAGE GLUCOSE: 134 mg/dL
Hgb A1c MFr Bld: 6.3 % — ABNORMAL HIGH (ref 4.8–5.6)

## 2016-03-07 LAB — TSH: TSH: 1.31 u[IU]/mL (ref 0.450–4.500)

## 2016-03-09 ENCOUNTER — Encounter: Payer: Self-pay | Admitting: Neurology

## 2016-03-09 ENCOUNTER — Ambulatory Visit (INDEPENDENT_AMBULATORY_CARE_PROVIDER_SITE_OTHER): Payer: BLUE CROSS/BLUE SHIELD | Admitting: Neurology

## 2016-03-09 VITALS — BP 120/78 | HR 53 | Ht 71.0 in | Wt 206.0 lb

## 2016-03-09 DIAGNOSIS — F458 Other somatoform disorders: Secondary | ICD-10-CM | POA: Diagnosis not present

## 2016-03-09 DIAGNOSIS — K117 Disturbances of salivary secretion: Secondary | ICD-10-CM | POA: Diagnosis not present

## 2016-03-09 DIAGNOSIS — K5901 Slow transit constipation: Secondary | ICD-10-CM

## 2016-03-09 DIAGNOSIS — G20 Parkinson's disease: Secondary | ICD-10-CM

## 2016-03-09 DIAGNOSIS — R1319 Other dysphagia: Secondary | ICD-10-CM

## 2016-03-09 NOTE — Patient Instructions (Signed)
1. Constipation and Parkinson's disease:   1.Rancho recipe for constipation in Parkinsons Disease:  -1 cup of bran, 2 cups of applesauce in 1 cup of prune juice  2.  Increase fiber intake (Metamucil,vegetables)  3.  Regular, moderate exercise can be beneficial.  4.  Avoid medications causing constipation, such as medications like antacids with calcium or magnesium  5.  Laxative overuse should be avoided.  6.  Stool softeners (Colace) can help with chronic constipation. 2. Let us know if you are interested in Botox for drooling. 3. The academy of Neurology doesn't recommend the use of Hemp Oil to treat symptoms of Parkinson's Disease.  4. Talk to Dr Malvin JohnsPotter about switching to Carbidopa Levodopa 25/100 IR to take during the day (up to 10 tablets) and just using Carbidopa Levodopa 50/200 at bedtime. 5. Rytary may be a future option if these forms of Levodopa do not control symptoms.  6. Mirapex (Pramipexole) may need to be added in the future.

## 2016-03-09 NOTE — Progress Notes (Signed)
Sean Barajas was seen today in the movement disorders clinic for neurologic consultation at the request of Dr. Malvin Johns.  His PCP is Elizabeth Sauer, MD.  The patient presents today, accompanied by his sister who supplements the history.  I have reviewed numerous records made available to me and went through them thoroughly.  The patient believes that his first symptom of Parkinson's disease was in approximately July, 2014 and began with jaw tremor and R leg tremor.  He was first started on levodopa to see if that was "effective" and ultimately changed to Requip.  The patient did not feel that Requip was helpful, and he was switched back to levodopa.  The patient saw Dr. Ardyth Man in December, 2014 and at that time the patient was on Azilect and levodopa.  Dr. Ardyth Man agreed with the diagnosis.  Therapies were recommended.  In December, 2015 the patient followed up with Dr. Malvin Johns and it was recommended that he start on carbidopa/levodopa 50/200 in the morning.  The patient did not do that, but this was recommended again in March, 2016, at which point he started on this.  In June, 2016 the patient started on Ativan for tremor, which was changed out the following month to Xanax.  In October, 2016 the patient return for follow-up, complaining of increased trouble with balance, speech and falls.  His carbidopa/levodopa 25/100 was decreased from 1-1/2 tablets 3 times a day to 1 tablet in the morning only and his carbidopa/levodopa 50/200 CR was increased to 3 times per day.  He was started on selegiline, but the patient was not on that long as he thought that it caused vision changes.  In November, 2016 the patient's Xanax was increased to 3 times a day dosing because of tremor.  States that he is currently taking carbidopa/levodopa 25/100, 1 po qid, carbidopa/levodopa 50/200 qid and azilect 1 mg daily.  He takes the IR and CR together and he takes the first at 3am/ 9am/2pm/7pm.  He is still on the xanax tid for  tremor as well (9am/2pm/7pm).  He can tell when the levodopa wears off - states that he feels stiff and slow).  Also states that he was just dx yesterday with pre-diabetes and wonders if that was contributes   Specific Symptoms:  Tremor: Yes.   Ionly on the L) Family hx of similar:  No. Voice: gotten weaker (did not do the LSVT LOUD ever) Sleep: sleeps with trazodone and CPAP x 2 months  Vivid Dreams:  No.  Acting out dreams:  No. Wet Pillows: Yes.   (no longer b/c of CPAP mask) Postural symptoms:  Yes.    Falls?  Yes.   Bradykinesia symptoms: shuffling gait, slow movements, drooling while awake and difficulty regaining balance (last LSVT BIG was in Oct/Nov) Loss of smell:  Yes.   Loss of taste:  Yes.   Urinary Incontinence:  No. Difficulty Swallowing:  Yes.   Handwriting, micrographia: Yes.   Trouble with ADL's:  Yes.   but slower, more difficult to tie shoes, brush teeth  Trouble buttoning clothing: No. Depression:  No. Memory changes:  Yes.  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  visual distortions: No. N/V:  No. Lightheaded:  Yes.    Syncope: No. Diplopia:  No. Dyskinesia:  No.   ALLERGIES:   Allergies  Allergen Reactions  . Iodinated Diagnostic Agents Nausea Only and Other (See  Comments)    Reaction:  Fever   . Sulfa Antibiotics Rash    CURRENT MEDICATIONS:  Outpatient Encounter Prescriptions as of 03/09/2016  Medication Sig  . acetaminophen (TYLENOL) 500 MG tablet Take 1,000 mg by mouth every 6 (six) hours as needed for mild pain.  Marland Kitchen. albuterol (PROVENTIL HFA;VENTOLIN HFA) 108 (90 BASE) MCG/ACT inhaler Inhale 2 puffs into the lungs every 6 (six) hours as needed for wheezing or shortness of breath.  . ALPRAZolam (XANAX) 0.5 MG tablet Take 0.5 mg by mouth 3 (three) times daily. Dr Malvin JohnsPotter  . ARTIFICIAL TEAR OP Apply 1 drop to eye as needed (for dry eyes).  Marland Kitchen. aspirin EC 81 MG  tablet Take 81 mg by mouth daily.  . carbidopa-levodopa (SINEMET CR) 50-200 MG tablet Take 1 tablet by mouth 4 (four) times daily.   . carbidopa-levodopa (SINEMET IR) 25-100 MG tablet Take 1 tablet by mouth 4 (four) times daily.   . carvedilol (COREG) 3.125 MG tablet Take 3.125 mg by mouth 2 (two) times daily.  . nitroGLYCERIN (NITROSTAT) 0.4 MG SL tablet Place 0.4 mg under the tongue every 5 (five) minutes as needed for chest pain.  . rasagiline (AZILECT) 1 MG TABS tablet Take 1 mg by mouth daily.  . rosuvastatin (CRESTOR) 20 MG tablet Take 20 mg by mouth at bedtime.  . sildenafil (VIAGRA) 100 MG tablet Take 100 mg by mouth daily as needed for erectile dysfunction.  . ticagrelor (BRILINTA) 90 MG TABS tablet Take 90 mg by mouth every 12 (twelve) hours.  . traZODone (DESYREL) 50 MG tablet Take 50-75 mg by mouth at bedtime.  . valACYclovir (VALTREX) 500 MG tablet Take 500 mg by mouth 2 (two) times daily.  . [DISCONTINUED] selegiline (ELDEPRYL) 5 MG capsule Take 5 mg by mouth 2 (two) times daily with a meal.   No facility-administered encounter medications on file as of 03/09/2016.    PAST MEDICAL HISTORY:   Past Medical History  Diagnosis Date  . Coronary artery disease   . Hypertension   . Hyperlipidemia   . Parkinson's disease (HCC)   . Prediabetes   . Anxiety     PAST SURGICAL HISTORY:   Past Surgical History  Procedure Laterality Date  . Cardiac catheterization    . Coronary angioplasty    . Coronary artery bypass graft    . Carotid stent  07/2015  . Appendectomy    . Tonsillectomy    . Cholecystectomy    . Nasal septum surgery    . Eye surgery Bilateral     SOCIAL HISTORY:   Social History   Social History  . Marital Status: Single    Spouse Name: N/A  . Number of Children: N/A  . Years of Education: N/A   Occupational History  . Not on file.   Social History Main Topics  . Smoking status: Never Smoker   . Smokeless tobacco: Not on file  . Alcohol Use: No  .  Drug Use: Yes     Comment: hemp oil  . Sexual Activity: Not on file   Other Topics Concern  . Not on file   Social History Narrative    FAMILY HISTORY:   Family Status  Relation Status Death Age  . Mother Alive     heart disease, breast cancer  . Father Deceased     heart disease, colon cancer  . Brother Alive     hodgkin's lymphoma  . Sister Alive     healthy  ROS:  A complete 10 system review of systems was obtained and was unremarkable apart from what is mentioned above.  PHYSICAL EXAMINATION:    VITALS:   Filed Vitals:   03/09/16 1236  BP: 120/78  Pulse: 53  Height:  (1.803 m)  Weight: 206 lb (93.441 kg)    GEN:  The patient appears stated age and is in NAD. HEENT:  Normocephalic.  He slipped on throw rug on Monday and hit face on table and he has ecchymosis under the eye on the right.  The mucous membranes are moist. The superficial temporal arteries are without ropiness or tenderness. CV:  RRR Lungs:  CTAB Neck/HEME:  There are no carotid bruits bilaterally.  Neurological examination:  Orientation: The patient is alert and oriented x3. Fund of knowledge is appropriate.  Recent and remote memory are intact.  Attention and concentration are normal.    Able to name objects and repeat phrases. Cranial nerves: There is good facial symmetry. There is facial hypomimia.  Pupils are equal round and reactive to light bilaterally. Fundoscopic exam reveals clear margins bilaterally. Extraocular muscles are intact. The visual fields are full to confrontational testing. The speech is fluent and clear.  He has significant difficulty with the guttural sounds.  Soft palate rises symmetrically and there is no tongue deviation. Hearing is intact to conversational tone. Sensation: Sensation is intact to light and pinprick throughout (facial, trunk, extremities). Vibration is intact at the bilateral big toe. There is no extinction with double simultaneous stimulation. There is  no sensory dermatomal level identified. Motor: Strength is 5/5 in the bilateral upper and lower extremities.   Shoulder shrug is equal and symmetric.  There is no pronator drift. Deep tendon reflexes: Deep tendon reflexes are 2-/4 at the bilateral biceps, triceps, brachioradialis, patella and achilles. Plantar responses are downgoing bilaterally.  Movement examination: Tone: There is normal tone in the bilateral upper extremities.  The tone in the lower extremities is normal.  Abnormal movements: There is a right foot tremor.  Otherwise, there is no tremor, even with distraction procedures. Coordination:  There is decremation with RAM's, seen with finger taps bilaterally and alternation of supination/pronation of the forearm bilaterally. Gait and Station: The patient has no difficulty arising out of a deep-seated chair without the use of the hands. The patient's stride length is normal, with good arm swing.  The patient has a negative pull test.      ASSESSMENT/PLAN:  1.  Parkinsonism.  I suspect that this does represent idiopathic Parkinson's disease.  The patient has tremor, bradykinesia, and postural instability.  He did not have rigidity today, although he is on his levodopa.  -We discussed the diagnosis as well as pathophysiology of the disease.  We discussed treatment options as well as prognostic indicators.  Patient education was provided.  -Greater than 50% of the 60 minute visit was spent in counseling answering questions and talking about what to expect now as well as in the future.  We talked about medication options as well as potential future surgical options.  We talked about safety in the home.  -The patient is currently taking carbidopa/levodopa 25/100 immediate release in addition to carbidopa/levodopa 50/200 CR and takes each of these 4 times per day, for a total of 1200 mg of levodopa per day.  I think he might find better efficacy if he switched all to the 25/100s, with the  exception of his bedtime dose, and he may even be able to lower his  overall dose by taking the carbidopa/levodopa 25/100 immediate release a little bit closer together rather than splitting them so far apart.  Dahlia Client I also talked about rytary as an option.  We talked about the benefits of this, as well as the down follow rytary.  We talked about its poor absorption.  A third option would be to add pramipexole.  He and I talked about this option as well.  I wrote all of these options out for them and he was going to talk to Dr. Malvin Johns about each of these.  -I talked to the patient about the option of DBS therapy in the future.  He does state that he is not sure that he would be cleared medically, as his cardiologist has suggested that he should not have surgery.  I did tell him that he would obviously need medical clearance for this.  He was given a pt DVD on DBS therapy.  -I did recommend LSVT LOUD for this patient.  He will talk it over with Dr. Malvin Johns.  He has done LSVT BIG  -We discussed community resources in the area including patient support groups and community exercise programs for PD.    -told him that I did not recommend Xanax.  I think that it increases his risk of cognitive change and falls.  There is obviously addiction potential as well and tolerance issues.  I also did not recommend it rapidly get tapered or discontinued because of risk for seizure.  He and I talked about this.  -Discussed with the patient that I do not recommend hemp oil that he was getting from [place].  He has been off of it for about a month.  Talked to him about the AAN position on this and it can increase risk for falls and cognitive change.  Do not recommend it in the Parkinson's population. 2.  Sialorrhea  -Discussed myobloc therapy and he will discuss with Dr. Malvin Johns 3.  Constipation  -copy of Rancho recipe given 4.  Dysphagia  -He states that he has an upcoming swallow eval, which I absolutely agree with. 5.   He will follow up with Dr. Malvin Johns, as this was a second opinion visit.

## 2016-04-03 ENCOUNTER — Ambulatory Visit: Payer: BLUE CROSS/BLUE SHIELD | Admitting: Family Medicine

## 2016-04-11 ENCOUNTER — Encounter: Payer: Self-pay | Admitting: Family Medicine

## 2016-04-11 ENCOUNTER — Ambulatory Visit (INDEPENDENT_AMBULATORY_CARE_PROVIDER_SITE_OTHER): Payer: BLUE CROSS/BLUE SHIELD | Admitting: Family Medicine

## 2016-04-11 VITALS — BP 130/80 | HR 60 | Ht 71.0 in | Wt 210.0 lb

## 2016-04-11 DIAGNOSIS — R079 Chest pain, unspecified: Secondary | ICD-10-CM

## 2016-04-11 DIAGNOSIS — R140 Abdominal distension (gaseous): Secondary | ICD-10-CM | POA: Diagnosis not present

## 2016-04-11 MED ORDER — SPIRONOLACTONE 25 MG PO TABS: 25.0000 mg | ORAL_TABLET | Freq: Every day | ORAL | Status: DC

## 2016-04-11 NOTE — Addendum Note (Signed)
Addended by: Everitt AmberLYNCH, Marieta Markov L on: 04/11/2016 02:33 PM   Modules accepted: Orders

## 2016-04-11 NOTE — Progress Notes (Signed)
Name: Sean Barajas   MRN: [id number]    DOB: 03/28/1956   Date:04/11/2016       Progress Note  Subjective  Chief Complaint  Chief Complaint  Patient presents with  . Follow-up    lab results showed pre-diabetes 6.3- recheck on that/ pt feeling better    Abdominal Pain This is a new problem. The current episode started more than 1 year ago. The onset quality is gradual. The problem occurs daily. The pain is located in the generalized abdominal region (bloating). The patient is experiencing no pain. Quality: bloating. Pertinent negatives include no anorexia, arthralgias, belching, constipation, diarrhea, dysuria, fever, flatus, frequency, headaches, hematochezia, hematuria, melena, myalgias, nausea, vomiting or weight loss. Nothing aggravates the pain.  Chest Pain  This is a new problem. The current episode [place] the [place] The onset quality is sudden. The problem occurs intermittently. The problem has been gradually improving. The pain is present in the substernal region. The pain is at a severity of 5/10. The pain is mild. The quality of the pain is described as tightness. Associated symptoms include abdominal pain. Pertinent negatives include no back pain, cough, dizziness, fever, headaches, malaise/fatigue, nausea, palpitations, shortness of breath, sputum production or vomiting.    No problem-specific assessment & plan notes found for this encounter.   Past Medical History  Diagnosis Date  . Coronary artery disease   . Hypertension   . Hyperlipidemia   . Parkinson's disease (HCC)   . Prediabetes   . Anxiety     Past Surgical History  Procedure Laterality Date  . Cardiac catheterization    . Coronary angioplasty    . Coronary artery bypass graft    . Carotid stent  07/2015  . Appendectomy    . Tonsillectomy    . Cholecystectomy    . Nasal septum surgery    . Eye surgery Bilateral     History reviewed. No pertinent family history.  Social  History   Social History  . Marital Status: Single    Spouse Name: N/A  . Number of Children: N/A  . Years of Education: N/A   Occupational History  . self employed     toe truck driver   Social History Main Topics  . Smoking status: Never Smoker   . Smokeless tobacco: Not on file  . Alcohol Use: 0.0 oz/week    0 Standard drinks or equivalent per week     Comment: 1 glass wine/month  . Drug Use: Yes     Comment: hemp oil  . Sexual Activity: Not on file   Other Topics Concern  . Not on file   Social History Narrative    Allergies  Allergen Reactions  . Iodinated Diagnostic Agents Nausea Only and Other (See Comments)    Reaction:  Fever   . Sulfa Antibiotics Rash     Review of Systems  Constitutional: Negative for fever, chills, weight loss and malaise/fatigue.  HENT: Negative for ear discharge, ear pain and sore throat.   Eyes: Negative for blurred vision.  Respiratory: Negative for cough, sputum production, shortness of breath and wheezing.   Cardiovascular: Negative for chest pain, palpitations and leg swelling.  Gastrointestinal: Positive for abdominal pain. Negative for heartburn, nausea, vomiting, diarrhea, constipation, blood in stool, melena, hematochezia, anorexia and flatus.  Genitourinary: Negative for dysuria, urgency, frequency and hematuria.  Musculoskeletal: Negative for myalgias, back pain, joint pain, arthralgias and neck pain.  Skin: Negative for rash.  Neurological: Negative  for dizziness, tingling, sensory change, focal weakness and headaches.  Endo/Heme/Allergies: Negative for environmental allergies and polydipsia. Does not bruise/bleed easily.  Psychiatric/Behavioral: Negative for depression and suicidal ideas. The patient is not nervous/anxious and does not have insomnia.      Objective  Filed Vitals:   04/11/16 1048  BP: 130/80  Pulse: 60  Height:  (1.803 m)  Weight: 210 lb (95.255 kg)    Physical Exam  Constitutional: He is  oriented to person, place, and time and well-developed, well-nourished, and in no distress.  HENT:  Head: Normocephalic.  Right Ear: External ear normal.  Left Ear: External ear normal.  Nose: Nose normal.  Mouth/Throat: Oropharynx is clear and moist.  Eyes: Conjunctivae and EOM are normal. Pupils are equal, round, and reactive to light. Right eye exhibits no discharge. Left eye exhibits no discharge. No scleral icterus.  Neck: Normal range of motion. Neck supple. No JVD present. No tracheal deviation present. No thyromegaly present.  Cardiovascular: Normal rate, regular rhythm, normal heart sounds and intact distal pulses.  Exam reveals no gallop and no friction rub.   No murmur heard. Pulmonary/Chest: Breath sounds normal. No respiratory distress. He has no wheezes. He has no rales.  Abdominal: Soft. Bowel sounds are normal. He exhibits no mass. There is no hepatosplenomegaly. There is no tenderness. There is no rebound, no guarding and no CVA tenderness.  No shifting dullness  Musculoskeletal: Normal range of motion. He exhibits no edema or tenderness.  Lymphadenopathy:    He has no cervical adenopathy.  Neurological: He is alert and oriented to person, place, and time. He has normal sensation, normal strength, normal reflexes and intact cranial nerves. No cranial nerve deficit.  Skin: Skin is warm. No rash noted.  Psychiatric: Mood and affect normal.  Nursing note and vitals reviewed.     Assessment & Plan  Problem List Items Addressed This Visit    None    Visit Diagnoses    Chest pain, unspecified chest pain type    -  Primary    Relevant Orders    Ambulatory referral to Cardiology    Abdominal bloating        Relevant Medications    spironolactone (ALDACTONE) 25 MG tablet    Other Relevant Orders    Hepatic function panel         Dr. Elizabeth Sauer [facility]  04/11/2016

## 2016-04-12 ENCOUNTER — Ambulatory Visit: Payer: BLUE CROSS/BLUE SHIELD | Admitting: Family Medicine

## 2016-04-12 LAB — HEPATIC FUNCTION PANEL
ALBUMIN: 4.4 g/dL (ref 3.6–4.8)
ALK PHOS: 80 IU/L (ref 39–117)
ALT: 11 IU/L (ref 0–44)
AST: 15 IU/L (ref 0–40)
BILIRUBIN TOTAL: 0.6 mg/dL (ref 0.0–1.2)
BILIRUBIN, DIRECT: 0.14 mg/dL (ref 0.00–0.40)
Total Protein: 6.6 g/dL (ref 6.0–8.5)

## 2016-04-13 ENCOUNTER — Ambulatory Visit: Payer: BLUE CROSS/BLUE SHIELD | Admitting: Family Medicine

## 2016-05-23 ENCOUNTER — Other Ambulatory Visit: Payer: Self-pay | Admitting: Urology

## 2016-05-23 DIAGNOSIS — R319 Hematuria, unspecified: Secondary | ICD-10-CM

## 2016-05-30 ENCOUNTER — Ambulatory Visit
Admission: RE | Admit: 2016-05-30 | Payer: BLUE CROSS/BLUE SHIELD | Source: Ambulatory Visit | Admitting: Unknown Physician Specialty

## 2016-05-30 ENCOUNTER — Encounter: Admission: RE | Payer: Self-pay | Source: Ambulatory Visit

## 2016-05-30 SURGERY — ESOPHAGOGASTRODUODENOSCOPY (EGD) WITH PROPOFOL
Anesthesia: General

## 2016-06-11 ENCOUNTER — Ambulatory Visit
Admission: RE | Admit: 2016-06-11 | Discharge: 2016-06-11 | Disposition: A | Discharge status: Home / Self Care | Payer: BLUE CROSS/BLUE SHIELD | Source: Ambulatory Visit | Attending: Urology | Admitting: Urology

## 2016-06-11 ENCOUNTER — Ambulatory Visit: Payer: BLUE CROSS/BLUE SHIELD

## 2016-06-11 DIAGNOSIS — N281 Cyst of kidney, acquired: Secondary | ICD-10-CM | POA: Diagnosis not present

## 2016-06-11 DIAGNOSIS — Z91041 Radiographic dye allergy status: Secondary | ICD-10-CM | POA: Diagnosis not present

## 2016-06-11 DIAGNOSIS — R319 Hematuria, unspecified: Secondary | ICD-10-CM | POA: Insufficient documentation

## 2016-06-11 DIAGNOSIS — N200 Calculus of kidney: Secondary | ICD-10-CM | POA: Diagnosis not present

## 2016-06-11 DIAGNOSIS — N3289 Other specified disorders of bladder: Secondary | ICD-10-CM | POA: Diagnosis not present

## 2016-06-11 DIAGNOSIS — N400 Enlarged prostate without lower urinary tract symptoms: Secondary | ICD-10-CM | POA: Diagnosis not present

## 2016-06-11 DIAGNOSIS — N210 Calculus in bladder: Secondary | ICD-10-CM | POA: Diagnosis not present

## 2016-06-11 DIAGNOSIS — I700 Atherosclerosis of aorta: Secondary | ICD-10-CM | POA: Diagnosis not present

## 2016-06-11 MED ORDER — IOPAMIDOL (ISOVUE-300) INJECTION 61%
125.0000 mL | Freq: Once | INTRAVENOUS | Status: AC | PRN
  Administered 2016-06-11: 125 mL via INTRAVENOUS

## 2016-06-25 ENCOUNTER — Encounter: Payer: Self-pay | Admitting: Family Medicine

## 2016-06-25 ENCOUNTER — Ambulatory Visit (INDEPENDENT_AMBULATORY_CARE_PROVIDER_SITE_OTHER): Payer: BLUE CROSS/BLUE SHIELD | Admitting: Family Medicine

## 2016-06-25 VITALS — BP 120/70 | HR 60 | Ht 71.0 in | Wt 207.0 lb

## 2016-06-25 DIAGNOSIS — R7303 Prediabetes: Secondary | ICD-10-CM | POA: Diagnosis not present

## 2016-06-25 DIAGNOSIS — R42 Dizziness and giddiness: Secondary | ICD-10-CM | POA: Diagnosis not present

## 2016-06-25 DIAGNOSIS — E860 Dehydration: Secondary | ICD-10-CM | POA: Diagnosis not present

## 2016-06-25 NOTE — Progress Notes (Signed)
Name: Sean Barajas   MRN: [id number]    DOB: 16-Jul-1956   Date:06/26/2016       Progress Note  Subjective  Chief Complaint  Chief Complaint  Patient presents with  . Dizziness    been dizzy x 3 days and feels disoriented    Just don't feel myself   Dizziness  This is a new problem. The current episode started in the past 7 days (Friday). The problem occurs constantly. The problem has been unchanged. Associated symptoms include abdominal pain, fatigue, numbness, vertigo and weakness. Pertinent negatives include no anorexia, arthralgias, change in bowel habit, chest pain, chills, congestion, coughing, diaphoresis, fever, headaches, joint swelling, myalgias, nausea, neck pain, rash, sore throat, swollen glands, urinary symptoms, visual change or vomiting. Associated symptoms comments: Numbness feet and hands. The symptoms are aggravated by standing (if i get up real quick/ fell 3 months ago). He has tried rest and drinking for the symptoms. The treatment provided no relief.    No problem-specific Assessment & Plan notes found for this encounter.   Past Medical History:  Diagnosis Date  . Anxiety   . Coronary artery disease   . Hyperlipidemia   . Hypertension   . Parkinson's disease (HCC)   . Prediabetes     Past Surgical History:  Procedure Laterality Date  . APPENDECTOMY    . CARDIAC CATHETERIZATION    . CAROTID STENT  07/2015  . CHOLECYSTECTOMY    . CORONARY ANGIOPLASTY    . CORONARY ARTERY BYPASS GRAFT    . EYE SURGERY Bilateral   . NASAL SEPTUM SURGERY    . TONSILLECTOMY      No family history on file.  Social History   Social History  . Marital status: Single    Spouse name: N/A  . Number of children: N/A  . Years of education: N/A   Occupational History  . self employed     toe truck driver   Social History Main Topics  . Smoking status: Never Smoker  . Smokeless tobacco: Not on file  . Alcohol use 0.0 oz/week     Comment: 1 glass wine/month  .  Drug use:      Comment: hemp oil  . Sexual activity: Not on file   Other Topics Concern  . Not on file   Social History Narrative  . No narrative on file    Allergies  Allergen Reactions  . Iodinated Diagnostic Agents Nausea Only and Other (See Comments)    Reaction:  Fever   . Sulfa Antibiotics Rash     Review of Systems  Constitutional: Positive for fatigue. Negative for chills, diaphoresis and fever.  HENT: Negative for congestion and sore throat.   Respiratory: Negative for cough.   Cardiovascular: Negative for chest pain.  Gastrointestinal: Positive for abdominal pain. Negative for anorexia, change in bowel habit, nausea and vomiting.  Musculoskeletal: Negative for arthralgias, joint swelling, myalgias and neck pain.  Skin: Negative for rash.  Neurological: Positive for dizziness, vertigo, weakness and numbness. Negative for headaches.     Objective  Vitals:   06/25/16 1642  BP: 120/70  Pulse: 60  Weight: 207 lb (93.9 kg)  Height: 5\' 11"  (1.803 m)    Physical Exam  Constitutional: He is oriented to person, place, and time and well-developed, well-nourished, and in no distress.  HENT:  Head: Normocephalic.  Right Ear: Tympanic membrane, external ear and ear canal normal.  Left Ear: Tympanic membrane, external ear and ear canal normal.  Nose:  Nose normal.  Mouth/Throat: Oropharynx is clear and moist. Mucous membranes are not pale, dry and not cyanotic. No posterior oropharyngeal edema or posterior oropharyngeal erythema.  Eyes: Conjunctivae and EOM are normal. Pupils are equal, round, and reactive to light. Right eye exhibits no discharge. Left eye exhibits no discharge. No scleral icterus.  Neck: Normal range of motion. Neck supple. No JVD present. No tracheal deviation present. No thyromegaly present.  Cardiovascular: Regular rhythm, normal heart sounds and intact distal pulses.  Bradycardia present.  Exam reveals no gallop, no S4 and no friction rub.   No  murmur heard. Pulmonary/Chest: Breath sounds normal. No respiratory distress. He has no wheezes. He has no rales.  Abdominal: Soft. Bowel sounds are normal. He exhibits no mass. There is no hepatosplenomegaly. There is no tenderness. There is no rebound, no guarding and no CVA tenderness.  Musculoskeletal: Normal range of motion. He exhibits no edema or tenderness.  Lymphadenopathy:    He has no cervical adenopathy.  Neurological: He is alert and oriented to person, place, and time. He has normal motor skills, normal sensation, normal strength, normal reflexes and intact cranial nerves. No cranial nerve deficit.  Skin: Skin is warm. No rash noted.  Psychiatric: Mood and affect normal.  Nursing note and vitals reviewed.     Assessment & Plan  Problem List Items Addressed This Visit      Other   Prediabetes   Relevant Orders   Renal Function Panel   POCT Glucose (CBG) (Completed)    Other Visit Diagnoses    Dehydration    -  Primary   Relevant Orders   CBC   Orthostatic dizziness       Relevant Orders   Renal Function Panel   CBC        Dr. Elizabeth Sauer [facility]  06/26/16

## 2016-06-25 NOTE — Patient Instructions (Signed)

## 2016-06-26 LAB — GLUCOSE, POCT (MANUAL RESULT ENTRY): POC GLUCOSE: 135 mg/dL — AB (ref 70–99)

## 2016-06-27 LAB — RENAL FUNCTION PANEL
Albumin: 4.4 g/dL (ref 3.6–4.8)
BUN / CREAT RATIO: 14 (ref 10–24)
BUN: 12 mg/dL (ref 8–27)
CO2: 26 mmol/L (ref 18–29)
CREATININE: 0.87 mg/dL (ref 0.76–1.27)
Calcium: 9.4 mg/dL (ref 8.6–10.2)
Chloride: 97 mmol/L (ref 96–106)
GFR calc non Af Amer: 93 mL/min/{1.73_m2} (ref >59)
GFR, EST AFRICAN AMERICAN: 108 mL/min/{1.73_m2} (ref >59)
GLUCOSE: 156 mg/dL — AB (ref 65–99)
PHOSPHORUS: 3.8 mg/dL (ref 2.5–4.5)
Potassium: 4.1 mmol/L (ref 3.5–5.2)
Sodium: 138 mmol/L (ref 134–144)

## 2016-06-27 LAB — CBC
HEMATOCRIT: 41.4 % (ref 37.5–51.0)
Hemoglobin: 14 g/dL (ref 12.6–17.7)
MCH: 31.1 pg (ref 26.6–33.0)
MCHC: 33.8 g/dL (ref 31.5–35.7)
MCV: 92 fL (ref 79–97)
Platelets: 141 10*3/uL — ABNORMAL LOW (ref 150–379)
RBC: 4.5 x10E6/uL (ref 4.14–5.80)
RDW: 14.5 % (ref 12.3–15.4)
WBC: 4 10*3/uL (ref 3.4–10.8)

## 2016-06-29 ENCOUNTER — Ambulatory Visit
Admission: EM | Admit: 2016-06-29 | Discharge: 2016-06-29 | Disposition: A | Discharge status: Home / Self Care | Payer: BLUE CROSS/BLUE SHIELD | Attending: Family Medicine | Admitting: Family Medicine

## 2016-06-29 DIAGNOSIS — T63461A Toxic effect of venom of wasps, accidental (unintentional), initial encounter: Secondary | ICD-10-CM | POA: Diagnosis not present

## 2016-06-29 DIAGNOSIS — L03114 Cellulitis of left upper limb: Secondary | ICD-10-CM | POA: Diagnosis not present

## 2016-06-29 MED ORDER — CEPHALEXIN 500 MG PO CAPS: 500.0000 mg | ORAL_CAPSULE | Freq: Four times a day (QID) | ORAL | 0 refills | Status: DC

## 2016-06-29 NOTE — ED Triage Notes (Signed)
Pt states he was stung by a wasp yesterday and is having swelling and redness with warmth at the site and is concerned it is infected.

## 2016-06-29 NOTE — ED Provider Notes (Signed)
[facility]    CSN: [id number] Arrival date & time: 06/29/16  1728  First Provider Contact:  First MD Initiated Contact with Patient 06/29/16 1814        History   Chief Complaint Chief Complaint  Patient presents with  . Insect Bite    HPI Sean Barajas is a 60 y.o. male.   60 yo male stung by a wasp yesterday and now with redness, warmth,  tenderness and swelling to the area. Denies any fevers, chills.    The history is provided by the patient.    Past Medical History:  Diagnosis Date  . Anxiety   . Coronary artery disease   . Hyperlipidemia   . Hypertension   . Parkinson's disease (HCC)   . Prediabetes     Patient Active Problem List   Diagnosis Date Noted  . Prediabetes 06/25/2016    Past Surgical History:  Procedure Laterality Date  . APPENDECTOMY    . CARDIAC CATHETERIZATION    . CAROTID STENT  07/2015  . CHOLECYSTECTOMY    . CORONARY ANGIOPLASTY    . CORONARY ARTERY BYPASS GRAFT    . EYE SURGERY Bilateral   . NASAL SEPTUM SURGERY    . TONSILLECTOMY         Home Medications    Prior to Admission medications   Medication Sig Start Date End Date Taking? Authorizing Provider  acetaminophen (TYLENOL) 500 MG tablet Take 1,000 mg by mouth every 6 (six) hours as needed for mild pain.    Historical Provider, MD  albuterol (PROVENTIL HFA;VENTOLIN HFA) 108 (90 BASE) MCG/ACT inhaler Inhale 2 puffs into the lungs every 6 (six) hours as needed for wheezing or shortness of breath.    Historical Provider, MD  ALPRAZolam Prudy Feeler) 0.5 MG tablet Take 0.5 mg by mouth 3 (three) times daily. Dr Malvin Johns    Historical Provider, MD  ARTIFICIAL TEAR OP Apply 1 drop to eye as needed (for dry eyes).    Historical Provider, MD  aspirin EC 81 MG tablet Take 81 mg by mouth daily.    Historical Provider, MD  carbidopa-levodopa (SINEMET CR) 50-200 MG tablet Take 1 tablet by mouth 4 (four) times daily.     Historical Provider, MD  carbidopa-levodopa (SINEMET IR)  25-100 MG tablet Take 1 tablet by mouth 4 (four) times daily.     Historical Provider, MD  carvedilol (COREG) 3.125 MG tablet Take 6.25 mg by mouth 2 (two) times daily.     Historical Provider, MD  cephALEXin (KEFLEX) 500 MG capsule Take 1 capsule (500 mg total) by mouth 4 (four) times daily. 06/29/16   Payton Mccallum, MD  nitroGLYCERIN (NITROSTAT) 0.4 MG SL tablet Place 0.4 mg under the tongue every 5 (five) minutes as needed for chest pain.    Historical Provider, MD  rasagiline (AZILECT) 1 MG TABS tablet Take 1 mg by mouth daily.    Historical Provider, MD  rosuvastatin (CRESTOR) 20 MG tablet Take 20 mg by mouth at bedtime.    Historical Provider, MD  sildenafil (VIAGRA) 100 MG tablet Take 100 mg by mouth daily as needed for erectile dysfunction.    Historical Provider, MD  spironolactone (ALDACTONE) 25 MG tablet Take 1 tablet (25 mg total) by mouth daily. 04/11/16   Duanne Limerick, MD  ticagrelor (BRILINTA) 90 MG TABS tablet Take 90 mg by mouth every 12 (twelve) hours.    Historical Provider, MD  traZODone (DESYREL) 50 MG tablet Take 50-75 mg by mouth at  bedtime.    Historical Provider, MD  valACYclovir (VALTREX) 500 MG tablet Take 500 mg by mouth 2 (two) times daily.    Historical Provider, MD    Family History No family history on file.  Social History Social History  Substance Use Topics  . Smoking status: Never Smoker  . Smokeless tobacco: Not on file  . Alcohol use 0.0 oz/week     Comment: 1 glass wine/month     Allergies   Iodinated diagnostic agents and Sulfa antibiotics   Review of Systems Review of Systems   Physical Exam Triage Vital Signs ED Triage Vitals  Enc Vitals Group     BP 06/29/16 1745 133/75     Pulse Rate 06/29/16 1745 (!) 54     Resp 06/29/16 1745 16     Temp 06/29/16 1745 97.7 F (36.5 C)     Temp Source 06/29/16 1745 Oral     SpO2 06/29/16 1745 98 %     Weight 06/29/16 1745 203 lb (92.1 kg)     Height 06/29/16 1745  (1.803 m)     Head  Circumference --      Peak Flow --      Pain Score 06/29/16 1747 4     Pain Loc --      Pain Edu? --      Excl. in GC? --    No data found.   Updated Vital Signs BP 133/75 (BP Location: Left Arm)   Pulse (!) 54   Temp 97.7 F (36.5 C) (Oral)   Resp 16   Ht  (1.803 m)   Wt 203 lb (92.1 kg)   SpO2 98%   BMI 28.31 kg/m   Visual Acuity Right Eye Distance:   Left Eye Distance:   Bilateral Distance:    Right Eye Near:   Left Eye Near:    Bilateral Near:     Physical Exam  Constitutional: He appears well-developed and well-nourished. No distress.  Musculoskeletal:       Left hand: He exhibits tenderness (to skin on dorsum of hand, blanchable erythema, and mild edema) and swelling. He exhibits normal range of motion, no bony tenderness, normal two-point discrimination, normal capillary refill, no deformity and no laceration. Normal sensation noted. Normal strength noted.  Skin: He is not diaphoretic.  Nursing note and vitals reviewed.    UC Treatments / Results  Labs (all labs ordered are listed, but only abnormal results are displayed) Labs Reviewed - No data to display  EKG  EKG Interpretation None       Radiology No results found.  Procedures Procedures (including critical care time)  Medications Ordered in UC Medications - No data to display   Initial Impression / Assessment and Plan / UC Course  I have reviewed the triage vital signs and the nursing notes.  Pertinent labs & imaging results that were available during my care of the patient were reviewed by me and considered in my medical decision making (see chart for details).  Clinical Course      Final Clinical Impressions(s) / UC Diagnoses   Final diagnoses:  Cellulitis of left hand  Wasp sting, accidental or unintentional, initial encounter    New Prescriptions Discharge Medication List as of 06/29/2016  6:14 PM    START taking these medications   Details  cephALEXin (KEFLEX)  500 MG capsule Take 1 capsule (500 mg total) by mouth 4 (four) times daily., Starting Fri 06/29/2016, Normal  1.  diagnosis reviewed with patient 2. rx as per orders above; reviewed possible side effects, interactions, risks and benefits  3. Recommend supportive treatment with warm compresses, elevation 4. Follow-up prn if symptoms worsen or don't improve    Payton Mccallum, MD 06/29/16 1818

## 2016-08-28 ENCOUNTER — Encounter (INDEPENDENT_AMBULATORY_CARE_PROVIDER_SITE_OTHER): Payer: Self-pay

## 2016-08-28 ENCOUNTER — Ambulatory Visit (INDEPENDENT_AMBULATORY_CARE_PROVIDER_SITE_OTHER): Payer: BLUE CROSS/BLUE SHIELD | Admitting: Family Medicine

## 2016-08-28 ENCOUNTER — Encounter: Payer: Self-pay | Admitting: Family Medicine

## 2016-08-28 VITALS — BP 146/76 | HR 68 | Temp 97.7°F | Ht 71.0 in | Wt 206.0 lb

## 2016-08-28 DIAGNOSIS — I700 Atherosclerosis of aorta: Secondary | ICD-10-CM | POA: Diagnosis not present

## 2016-08-28 DIAGNOSIS — R1084 Generalized abdominal pain: Secondary | ICD-10-CM

## 2016-08-28 LAB — POCT URINALYSIS DIPSTICK
BILIRUBIN UA: NEGATIVE
GLUCOSE UA: NEGATIVE
KETONES UA: NEGATIVE
LEUKOCYTES UA: NEGATIVE
NITRITE UA: NEGATIVE
PH UA: 7
RBC UA: NEGATIVE
Spec Grav, UA: 1.015
Urobilinogen, UA: 0.2

## 2016-08-28 NOTE — Progress Notes (Signed)
Name: Sean Barajas   MRN: [id number]    DOB: 01/11/1956   Date:08/28/2016       Progress Note  Subjective  Chief Complaint  Chief Complaint  Patient presents with  . Abdominal Pain    hurting x 1 month/ worse in the past week. Has fluid build up- went up a pants size. Had CT scan in July- showed a kidney stone- is concerned about internal bleeding.    Abdominal Pain  This is a recurrent (recurred since fall) problem. The current episode started more than 1 month ago. The onset quality is gradual. The problem occurs intermittently. The problem has been gradually improving. The pain is located in the generalized abdominal region. The pain is at a severity of 3/10. The pain is moderate. The quality of the pain is aching. The abdominal pain does not radiate. Associated symptoms include flatus. Pertinent negatives include no anorexia, arthralgias, belching, constipation, diarrhea, dysuria, fever, frequency, headaches, hematochezia, hematuria, melena, myalgias, nausea, vomiting or weight loss. Nothing (fell off side of couch/? hematoma) aggravates the pain. The pain is relieved by nothing. He has tried nothing for the symptoms. The treatment provided mild relief. Prior diagnostic workup includes CT scan.    No problem-specific Assessment & Plan notes found for this encounter.   Past Medical History:  Diagnosis Date  . Anxiety   . Coronary artery disease   . Hyperlipidemia   . Hypertension   . Parkinson's disease (HCC)   . Prediabetes     Past Surgical History:  Procedure Laterality Date  . APPENDECTOMY    . CARDIAC CATHETERIZATION    . CAROTID STENT  07/2015  . CHOLECYSTECTOMY    . CORONARY ANGIOPLASTY    . CORONARY ARTERY BYPASS GRAFT    . EYE SURGERY Bilateral   . NASAL SEPTUM SURGERY    . TONSILLECTOMY      History reviewed. No pertinent family history.  Social History   Social History  . Marital status: Single    Spouse name: N/A  . Number of children: N/A  . Years  of education: N/A   Occupational History  . self employed     toe truck driver   Social History Main Topics  . Smoking status: Never Smoker  . Smokeless tobacco: Not on file  . Alcohol use 0.0 oz/week     Comment: 1 glass wine/month  . Drug use:      Comment: hemp oil  . Sexual activity: Not on file   Other Topics Concern  . Not on file   Social History Narrative  . No narrative on file    Allergies  Allergen Reactions  . Iodinated Diagnostic Agents Nausea Only and Other (See Comments)    Reaction:  Fever   . Sulfa Antibiotics Rash     Review of Systems  Constitutional: Negative for chills, fever, malaise/fatigue and weight loss.  HENT: Negative for ear discharge, ear pain and sore throat.   Eyes: Negative for blurred vision.  Respiratory: Negative for cough, sputum production, shortness of breath and wheezing.   Cardiovascular: Negative for chest pain, palpitations and leg swelling.  Gastrointestinal: Positive for abdominal pain and flatus. Negative for anorexia, blood in stool, constipation, diarrhea, heartburn, hematochezia, melena, nausea and vomiting.  Genitourinary: Negative for dysuria, frequency, hematuria and urgency.  Musculoskeletal: Negative for arthralgias, back pain, joint pain, myalgias and neck pain.  Skin: Negative for rash.  Neurological: Negative for dizziness, tingling, sensory change, focal weakness and headaches.  Endo/Heme/Allergies: Negative  for environmental allergies and polydipsia. Does not bruise/bleed easily.  Psychiatric/Behavioral: Negative for depression and suicidal ideas. The patient is not nervous/anxious and does not have insomnia.      Objective  Vitals:   08/28/16 1335  BP: (!) 146/76  Pulse: 68  Temp: 97.7 F (36.5 C)  TempSrc: Oral  Weight: 206 lb (93.4 kg)  Height: 5\' 11"  (1.803 m)    Physical Exam  Constitutional: He is oriented to person, place, and time and well-developed, well-nourished, and in no distress.   HENT:  Head: Normocephalic.  Right Ear: External ear normal.  Left Ear: External ear normal.  Nose: Nose normal.  Mouth/Throat: Oropharynx is clear and moist.  Eyes: Conjunctivae and EOM are normal. Pupils are equal, round, and reactive to light. Right eye exhibits no discharge. Left eye exhibits no discharge. No scleral icterus.  Neck: Normal range of motion. Neck supple. No JVD present. No tracheal deviation present. No thyromegaly present.  Cardiovascular: Normal rate, regular rhythm, normal heart sounds and intact distal pulses.  Exam reveals no gallop and no friction rub.   No murmur heard. Pulmonary/Chest: Breath sounds normal. No respiratory distress. He has no wheezes. He has no rales.  Abdominal: Soft. Bowel sounds are normal. He exhibits no distension, no fluid wave and no mass. There is no hepatosplenomegaly. There is generalized tenderness. There is no rigidity, no rebound, no guarding, no CVA tenderness, no tenderness at McBurney's point and negative Murphy's sign.  Musculoskeletal: Normal range of motion. He exhibits no edema or tenderness.  Lymphadenopathy:    He has no cervical adenopathy.  Neurological: He is alert and oriented to person, place, and time. He has normal sensation, normal strength, normal reflexes and intact cranial nerves. No cranial nerve deficit.  Skin: Skin is warm. No rash noted.  Psychiatric: Mood and affect normal.  Nursing note and vitals reviewed.     Assessment & Plan  Problem List Items Addressed This Visit      Cardiovascular and Mediastinum   Aortic atherosclerosis (HCC)    Other Visit Diagnoses    Generalized abdominal pain    -  Primary   s/p fall   Relevant Orders   CBC   US Abdomen Complete   POCT urinalysis dipstick (Completed)        Dr. [facility]  08/28/16

## 2016-08-29 LAB — CBC
HEMATOCRIT: 42.8 % (ref 37.5–51.0)
HEMOGLOBIN: 14.9 g/dL (ref 12.6–17.7)
MCH: 32.5 pg (ref 26.6–33.0)
MCHC: 34.8 g/dL (ref 31.5–35.7)
MCV: 93 fL (ref 79–97)
Platelets: 145 10*3/uL — ABNORMAL LOW (ref 150–379)
RBC: 4.58 x10E6/uL (ref 4.14–5.80)
RDW: 13.9 % (ref 12.3–15.4)
WBC: 3.6 10*3/uL (ref 3.4–10.8)

## 2016-08-30 ENCOUNTER — Ambulatory Visit
Admission: RE | Admit: 2016-08-30 | Discharge: 2016-08-30 | Disposition: A | Discharge status: Home / Self Care | Payer: BLUE CROSS/BLUE SHIELD | Source: Ambulatory Visit | Attending: Family Medicine | Admitting: Family Medicine

## 2016-08-30 DIAGNOSIS — N281 Cyst of kidney, acquired: Secondary | ICD-10-CM | POA: Diagnosis not present

## 2016-08-30 DIAGNOSIS — N200 Calculus of kidney: Secondary | ICD-10-CM | POA: Diagnosis not present

## 2016-08-30 DIAGNOSIS — Z9049 Acquired absence of other specified parts of digestive tract: Secondary | ICD-10-CM | POA: Insufficient documentation

## 2016-08-30 DIAGNOSIS — R1084 Generalized abdominal pain: Secondary | ICD-10-CM | POA: Insufficient documentation

## 2016-09-03 ENCOUNTER — Ambulatory Visit: Payer: BLUE CROSS/BLUE SHIELD

## 2016-10-05 ENCOUNTER — Encounter: Payer: Self-pay | Admitting: Family Medicine

## 2016-10-05 ENCOUNTER — Ambulatory Visit (INDEPENDENT_AMBULATORY_CARE_PROVIDER_SITE_OTHER): Payer: BLUE CROSS/BLUE SHIELD | Admitting: Family Medicine

## 2016-10-05 VITALS — BP 140/80 | HR 72 | Ht 71.0 in | Wt 209.0 lb

## 2016-10-05 DIAGNOSIS — G20 Parkinson's disease: Secondary | ICD-10-CM | POA: Diagnosis not present

## 2016-10-05 DIAGNOSIS — S060X0A Concussion without loss of consciousness, initial encounter: Secondary | ICD-10-CM

## 2016-10-05 NOTE — Progress Notes (Signed)
Name: Sean EdisonRobert M Barajas   MRN: [id number]    DOB: 05/02/1956   Date:10/05/2016       Progress Note  Subjective  Chief Complaint  Chief Complaint  Patient presents with  . Headache    tried to come off Alprazolam and Trazadone by himself    Headache   This is a new problem. The current episode started yesterday. The problem occurs intermittently. The problem has been waxing and waning. The pain is located in the right unilateral and frontal region. The pain does not radiate. The pain quality is not similar to prior headaches. The quality of the pain is described as aching. The pain is at a severity of 7/10 (7 last night/ 0 now). Associated symptoms include insomnia. Pertinent negatives include no abdominal pain, anorexia, back pain, blurred vision, coughing, dizziness, ear pain, eye watering, facial sweating, fever, hearing loss, loss of balance, muscle aches, nausea, neck pain, numbness, phonophobia, photophobia, scalp tenderness, seizures, sinus pressure, sore throat, tingling or weight loss. Nothing aggravates the symptoms. He has tried acetaminophen for the symptoms.    No problem-specific Assessment & Plan notes found for this encounter.   Past Medical History:  Diagnosis Date  . Anxiety   . Coronary artery disease   . Hyperlipidemia   . Hypertension   . Parkinson's disease (HCC)   . Prediabetes     Past Surgical History:  Procedure Laterality Date  . APPENDECTOMY    . CARDIAC CATHETERIZATION    . CAROTID STENT  07/2015  . CHOLECYSTECTOMY    . CORONARY ANGIOPLASTY    . CORONARY ARTERY BYPASS GRAFT    . EYE SURGERY Bilateral   . NASAL SEPTUM SURGERY    . TONSILLECTOMY      No family history on file.  Social History   Social History  . Marital status: Single    Spouse name: N/A  . Number of children: N/A  . Years of education: N/A   Occupational History  . self employed     toe truck driver   Social History Main Topics  . Smoking status: Never Smoker  .  Smokeless tobacco: Not on file  . Alcohol use 0.0 oz/week     Comment: 1 glass wine/month  . Drug use:      Comment: hemp oil  . Sexual activity: Not on file   Other Topics Concern  . Not on file   Social History Narrative  . No narrative on file    Allergies  Allergen Reactions  . Iodinated Diagnostic Agents Nausea Only and Other (See Comments)    Reaction:  Fever   . Sulfa Antibiotics Rash     Review of Systems  Constitutional: Negative for chills, fever, malaise/fatigue and weight loss.  HENT: Negative for ear discharge, ear pain, hearing loss, sinus pressure and sore throat.   Eyes: Negative for blurred vision and photophobia.  Respiratory: Negative for cough, sputum production, shortness of breath and wheezing.   Cardiovascular: Negative for chest pain, palpitations and leg swelling.  Gastrointestinal: Negative for abdominal pain, anorexia, blood in stool, constipation, diarrhea, heartburn, melena and nausea.  Genitourinary: Negative for dysuria, frequency, hematuria and urgency.  Musculoskeletal: Negative for back pain, joint pain, myalgias and neck pain.  Skin: Negative for rash.  Neurological: Positive for headaches. Negative for dizziness, tingling, sensory change, focal weakness, seizures, numbness and loss of balance.  Endo/Heme/Allergies: Negative for environmental allergies and polydipsia. Does not bruise/bleed easily.  Psychiatric/Behavioral: Negative for depression and suicidal ideas. The  patient has insomnia. The patient is not nervous/anxious.      Objective  Vitals:   10/05/16 1513  BP: 140/80  Pulse: 72  Weight: 209 lb (94.8 kg)  Height: 5\' 11"  (1.803 m)    Physical Exam  Constitutional: He is oriented to person, place, and time and well-developed, well-nourished, and in no distress.  HENT:  Head: Normocephalic.  Right Ear: External ear normal.  Left Ear: External ear normal.  Nose: Nose normal.  Mouth/Throat: Oropharynx is clear and moist.   Eyes: Conjunctivae and EOM are normal. Pupils are equal, round, and reactive to light. Right eye exhibits no discharge. Left eye exhibits no discharge. No scleral icterus.  Neck: Normal range of motion. Neck supple. No JVD present. No tracheal deviation present. No thyromegaly present.  Cardiovascular: Normal rate, regular rhythm, normal heart sounds and intact distal pulses.  Exam reveals no gallop and no friction rub.   No murmur heard. Pulmonary/Chest: Breath sounds normal. No respiratory distress. He has no wheezes. He has no rales.  Abdominal: Soft. Bowel sounds are normal. He exhibits no mass. There is no hepatosplenomegaly. There is no tenderness. There is no rebound, no guarding and no CVA tenderness.  Musculoskeletal: Normal range of motion. He exhibits no edema or tenderness.  Lymphadenopathy:    He has no cervical adenopathy.  Neurological: He is alert and oriented to person, place, and time. He has normal sensation, normal strength, normal reflexes and intact cranial nerves. No cranial nerve deficit.  Skin: Skin is warm. No rash noted.  Psychiatric: Mood and affect normal.  Nursing note and vitals reviewed.     Assessment & Plan  Problem List Items Addressed This Visit    None    Visit Diagnoses    Concussion without loss of consciousness, initial encounter    -  Primary   Parkinson's disease [facility](HCC)       Relevant Orders   Ambulatory referral to Neurology        Dr. Elizabeth Sauereanna Jones [facility]  10/05/16

## 2016-10-05 NOTE — Patient Instructions (Signed)
Concussion, Adult  A concussion, or closed-head injury, is a brain injury caused by a direct blow to the head or by a quick and sudden movement (jolt) of the head or neck. Concussions are usually not life-threatening. Even so, the effects of a concussion can be serious. If you have had a concussion before, you are more likely to experience concussion-like symptoms after a direct blow to the head.   CAUSES  · Direct blow to the head, such as from running into another player during a soccer game, being hit in a fight, or hitting your head on a hard surface.  · A jolt of the head or neck that causes the brain to move back and forth inside the skull, such as in a car crash.  SIGNS AND SYMPTOMS  The signs of a concussion can be hard to notice. Early on, they may be missed by you, family members, and health care providers. You may look fine but act or feel differently.  Symptoms are usually temporary, but they may last for days, weeks, or even longer. Some symptoms may appear right away while others may not show up for hours or days. Every head injury is different. Symptoms include:  · Mild to moderate headaches that will not go away.  · A feeling of pressure inside your head.  · Having more trouble than usual:    Learning or remembering things you have heard.    Answering questions.    Paying attention or concentrating.    Organizing daily tasks.    Making decisions and solving problems.  · Slowness in thinking, acting or reacting, speaking, or reading.  · Getting lost or being easily confused.  · Feeling tired all the time or lacking energy (fatigued).  · Feeling drowsy.  · Sleep disturbances.    Sleeping more than usual.    Sleeping less than usual.    Trouble falling asleep.    Trouble sleeping (insomnia).  · Loss of balance or feeling lightheaded or dizzy.  · Nausea or vomiting.  · Numbness or tingling.  · Increased sensitivity to:    Sounds.    Lights.    Distractions.  · Vision problems or eyes that tire  easily.  · Diminished sense of taste or smell.  · Ringing in the ears.  · Mood changes such as feeling sad or anxious.  · Becoming easily irritated or angry for little or no reason.  · Lack of motivation.  · Seeing or hearing things other people do not see or hear (hallucinations).  DIAGNOSIS  Your health care provider can usually diagnose a concussion based on a description of your injury and symptoms. He or she will ask whether you passed out (lost consciousness) and whether you are having trouble remembering events that happened right before and during your injury.  Your evaluation might include:  · A brain scan to look for signs of injury to the brain. Even if the test shows no injury, you may still have a concussion.  · Blood tests to be sure other problems are not present.  TREATMENT  · Concussions are usually treated in an emergency department, in urgent care, or at a clinic. You may need to stay in the hospital overnight for further treatment.  · Tell your health care provider if you are taking any medicines, including prescription medicines, over-the-counter medicines, and natural remedies. Some medicines, such as blood thinners (anticoagulants) and aspirin, may increase the chance of complications. Also tell your health care   provider whether you have had alcohol or are taking illegal drugs. This information may affect treatment.  · Your health care provider will send you home with important instructions to follow.  · How fast you will recover from a concussion depends on many factors. These factors include how severe your concussion is, what part of your brain was injured, your age, and how healthy you were before the concussion.  · Most people with mild injuries recover fully. Recovery can take time. In general, recovery is slower in older persons. Also, persons who have had a concussion in the past or have other medical problems may find that it takes longer to recover from their current injury.  HOME  CARE INSTRUCTIONS  General Instructions  · Carefully follow the directions your health care provider gave you.  · Only take over-the-counter or prescription medicines for pain, discomfort, or fever as directed by your health care provider.  · Take only those medicines that your health care provider has approved.  · Do not drink alcohol until your health care provider says you are well enough to do so. Alcohol and certain other drugs may slow your recovery and can put you at risk of further injury.  · If it is harder than usual to remember things, write them down.  · If you are easily distracted, try to do one thing at a time. For example, do not try to watch TV while fixing dinner.  · Talk with family members or close friends when making important decisions.  · Keep all follow-up appointments. Repeated evaluation of your symptoms is recommended for your recovery.  · Watch your symptoms and tell others to do the same. Complications sometimes occur after a concussion. Older adults with a brain injury may have a higher risk of serious complications, such as a blood clot on the brain.  · Tell your teachers, school nurse, school counselor, coach, athletic trainer, or work manager about your injury, symptoms, and restrictions. Tell them about what you can or cannot do. They should watch for:    Increased problems with attention or concentration.    Increased difficulty remembering or learning new information.    Increased time needed to complete tasks or assignments.    Increased irritability or decreased ability to cope with stress.    Increased symptoms.  · Rest. Rest helps the brain to heal. Make sure you:    Get plenty of sleep at night. Avoid staying up late at night.    Keep the same bedtime hours on weekends and weekdays.    Rest during the day. Take daytime naps or rest breaks when you feel tired.  · Limit activities that require a lot of thought or concentration. These include:    Doing homework or job-related  work.    Watching TV.    Working on the computer.  · Avoid any situation where there is potential for another head injury (football, hockey, soccer, basketball, martial arts, downhill snow sports and horseback riding). Your condition will get worse every time you experience a concussion. You should avoid these activities until you are evaluated by the appropriate follow-up health care providers.  Returning To Your Regular Activities  You will need to return to your normal activities slowly, not all at once. You must give your body and brain enough time for recovery.  · Do not return to sports or other athletic activities until your health care provider tells you it is safe to do so.  · Ask   your health care provider when you can drive, ride a bicycle, or operate heavy machinery. Your ability to react may be slower after a brain injury. Never do these activities if you are dizzy.  · Ask your health care provider about when you can return to work or school.  Preventing Another Concussion  It is very important to avoid another brain injury, especially before you have recovered. In rare cases, another injury can lead to permanent brain damage, brain swelling, or death. The risk of this is greatest during the first 7-10 days after a head injury. Avoid injuries by:  · Wearing a seat belt when riding in a car.  · Drinking alcohol only in moderation.  · Wearing a helmet when biking, skiing, skateboarding, skating, or doing similar activities.  · Avoiding activities that could lead to a second concussion, such as contact or recreational sports, until your health care provider says it is okay.  · Taking safety measures in your home.    Remove clutter and tripping hazards from floors and stairways.    Use grab bars in bathrooms and handrails by stairs.    Place non-slip mats on floors and in bathtubs.    Improve lighting in dim areas.  SEEK MEDICAL CARE IF:  · You have increased problems paying attention or  concentrating.  · You have increased difficulty remembering or learning new information.  · You need more time to complete tasks or assignments than before.  · You have increased irritability or decreased ability to cope with stress.  · You have more symptoms than before.  Seek medical care if you have any of the following symptoms for more than 2 weeks after your injury:  · Lasting (chronic) headaches.  · Dizziness or balance problems.  · Nausea.  · Vision problems.  · Increased sensitivity to noise or light.  · Depression or mood swings.  · Anxiety or irritability.  · Memory problems.  · Difficulty concentrating or paying attention.  · Sleep problems.  · Feeling tired all the time.  SEEK IMMEDIATE MEDICAL CARE IF:  · You have severe or worsening headaches. These may be a sign of a blood clot in the brain.  · You have weakness (even if only in one hand, leg, or part of the face).  · You have numbness.  · You have decreased coordination.  · You vomit repeatedly.  · You have increased sleepiness.  · One pupil is larger than the other.  · You have convulsions.  · You have slurred speech.  · You have increased confusion. This may be a sign of a blood clot in the brain.  · You have increased restlessness, agitation, or irritability.  · You are unable to recognize people or places.  · You have neck pain.  · It is difficult to wake you up.  · You have unusual behavior changes.  · You lose consciousness.  MAKE SURE YOU:  · Understand these instructions.  · Will watch your condition.  · Will get help right away if you are not doing well or get worse.     This information is not intended to replace advice given to you by your health care provider. Make sure you discuss any questions you have with your health care provider.     Document Released: 02/09/2004 Document Revised: 12/10/2014 Document Reviewed: 06/11/2013  [employer] Interactive Patient Education ©2016 [employer]
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
## 2016-10-19 ENCOUNTER — Encounter: Payer: Self-pay | Admitting: Neurology

## 2016-10-19 ENCOUNTER — Ambulatory Visit (INDEPENDENT_AMBULATORY_CARE_PROVIDER_SITE_OTHER): Payer: BLUE CROSS/BLUE SHIELD | Admitting: Neurology

## 2016-10-19 VITALS — BP 144/80 | HR 60 | Ht 71.0 in | Wt 207.0 lb

## 2016-10-19 DIAGNOSIS — G20 Parkinson's disease: Secondary | ICD-10-CM

## 2016-10-19 DIAGNOSIS — R1319 Other dysphagia: Secondary | ICD-10-CM | POA: Diagnosis not present

## 2016-10-19 DIAGNOSIS — K5901 Slow transit constipation: Secondary | ICD-10-CM | POA: Diagnosis not present

## 2016-10-19 MED ORDER — PRAMIPEXOLE DIHYDROCHLORIDE 0.125 MG PO TABS: ORAL_TABLET | ORAL | 0 refills | Status: DC

## 2016-10-19 MED ORDER — PRAMIPEXOLE DIHYDROCHLORIDE 0.5 MG PO TABS: 0.5000 mg | ORAL_TABLET | Freq: Three times a day (TID) | ORAL | 2 refills | Status: DC

## 2016-10-19 NOTE — Patient Instructions (Addendum)
1. Start mirapex (pramipexole) as follows:  0.125 mg - 1 tablet three times per day for a week, then 2 tablets three times per day for a week and then fill the 0.5 mg tablet and take that, 1 pill three times per day  2. Follow up in 10 weeks

## 2016-11-16 ENCOUNTER — Other Ambulatory Visit: Payer: Self-pay

## 2016-11-16 ENCOUNTER — Telehealth: Payer: Self-pay

## 2016-11-16 DIAGNOSIS — R110 Nausea: Secondary | ICD-10-CM

## 2016-11-16 MED ORDER — PROMETHAZINE HCL 25 MG PO TABS: 25.0000 mg | ORAL_TABLET | Freq: Three times a day (TID) | ORAL | 0 refills | Status: DC | PRN

## 2016-11-16 NOTE — Telephone Encounter (Signed)
Pt called in to say that he was seen in ER for kidney stone. Was given pain med but hasn't been able to keep down due to reflux and nausea. Told him to pick up TUMS or Prilosec OTC to help with heartburn/ reflux and will [facility]

## 2016-12-24 ENCOUNTER — Ambulatory Visit: Payer: BLUE CROSS/BLUE SHIELD | Admitting: Neurology
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
## 2016-12-28 ENCOUNTER — Encounter: Payer: Self-pay | Admitting: Neurology

## 2016-12-28 ENCOUNTER — Ambulatory Visit (INDEPENDENT_AMBULATORY_CARE_PROVIDER_SITE_OTHER): Payer: BLUE CROSS/BLUE SHIELD | Admitting: Neurology

## 2016-12-28 VITALS — BP 118/78 | HR 68 | Ht 71.0 in | Wt 218.0 lb

## 2016-12-28 DIAGNOSIS — T887XXA Unspecified adverse effect of drug or medicament, initial encounter: Secondary | ICD-10-CM | POA: Diagnosis not present

## 2016-12-28 DIAGNOSIS — G20 Parkinson's disease: Secondary | ICD-10-CM

## 2016-12-28 DIAGNOSIS — T50905A Adverse effect of unspecified drugs, medicaments and biological substances, initial encounter: Secondary | ICD-10-CM

## 2016-12-28 MED ORDER — CARBIDOPA-LEVODOPA ER 50-200 MG PO TBCR: 1.0000 | EXTENDED_RELEASE_TABLET | Freq: Every day | ORAL | 1 refills | Status: DC

## 2016-12-28 MED ORDER — ROTIGOTINE 4 MG/24HR TD PT24: 1.0000 | MEDICATED_PATCH | Freq: Every day | TRANSDERMAL | 0 refills | Status: DC

## 2016-12-28 MED ORDER — CARBIDOPA-LEVODOPA 25-100 MG PO TABS: 2.0000 | ORAL_TABLET | Freq: Every day | ORAL | 1 refills | Status: DC

## 2016-12-28 NOTE — Patient Instructions (Addendum)
1. You can take up to 12 tablets of Carbidopa Levodopa 25/100 IR daily.   2. Continue Carbidopa Levodopa 50/200 at night.   3. Start Neupro 4 mg daily. We will call and see how you are doing in a few weeks.   4. Let us know if you are interested in taking to patient who has undergone DBS. Also, let us know if you want to proceed with on/off testing.

## 2016-12-31 ENCOUNTER — Encounter: Payer: Self-pay | Admitting: Emergency Medicine

## 2016-12-31 ENCOUNTER — Ambulatory Visit (INDEPENDENT_AMBULATORY_CARE_PROVIDER_SITE_OTHER): Payer: BLUE CROSS/BLUE SHIELD

## 2016-12-31 ENCOUNTER — Ambulatory Visit
Admission: EM | Admit: 2016-12-31 | Discharge: 2016-12-31 | Disposition: A | Discharge status: Home / Self Care | Payer: BLUE CROSS/BLUE SHIELD | Attending: Family Medicine | Admitting: Family Medicine

## 2016-12-31 DIAGNOSIS — W19XXXA Unspecified fall, initial encounter: Secondary | ICD-10-CM

## 2016-12-31 DIAGNOSIS — S5012XA Contusion of left forearm, initial encounter: Secondary | ICD-10-CM | POA: Diagnosis not present

## 2016-12-31 NOTE — ED Triage Notes (Signed)
Patient states that he fell 2 days ago and hit his left elbow on a piece of furniture.

## 2016-12-31 NOTE — ED Provider Notes (Signed)
CSN: [id number]     Arrival date & time 12/31/16  1621 History   First MD Initiated Contact with Patient 12/31/16 1836     Chief Complaint  Patient presents with  . Fall  . Elbow Pain    left elbow   (Consider location/radiation/quality/duration/timing/severity/associated sxs/prior Treatment) HPI  This is a 61 year old male with a history of Parkinson's disease presents with left elbow pain after he fell in 2 occasions. The first time was 2 days ago when he fell he tripped over his dog and hit his left elbow on a piece of furniture. He did not think that he injured it badly that time but a second fall occurred the day later when he fell against concrete and hurt a loud snap. He has a bump just distal to his olecranon that is tender he states he is having some numbness and tingling to his fingers though indicates his index and middle finger.      Past Medical History:  Diagnosis Date  . Anxiety   . Coronary artery disease   . Hyperlipidemia   . Hypertension   . Parkinson's disease (HCC)   . Prediabetes    Past Surgical History:  Procedure Laterality Date  . APPENDECTOMY    . CARDIAC CATHETERIZATION    . CAROTID STENT  07/2015  . CHOLECYSTECTOMY    . CORONARY ANGIOPLASTY    . CORONARY ARTERY BYPASS GRAFT    . EYE SURGERY Bilateral   . NASAL SEPTUM SURGERY    . TONSILLECTOMY     History reviewed. No pertinent family history. Social History  Substance Use Topics  . Smoking status: Never Smoker  . Smokeless tobacco: Never Used  . Alcohol use 0.0 oz/week     Comment: 1 glass wine/month    Review of Systems  Constitutional: Positive for activity change. Negative for chills, fatigue and fever.  Musculoskeletal: Positive for myalgias.  All other systems reviewed and are negative.   Allergies  Iodinated diagnostic agents and Sulfa antibiotics  Home Medications   Prior to Admission medications   Medication Sig Start Date End Date Taking? Authorizing Provider   acetaminophen (TYLENOL) 500 MG tablet Take 1,000 mg by mouth every 6 (six) hours as needed for mild pain.    Historical Provider, MD  albuterol (PROVENTIL HFA;VENTOLIN HFA) 108 (90 BASE) MCG/ACT inhaler Inhale 2 puffs into the lungs every 6 (six) hours as needed for wheezing or shortness of breath.    Historical Provider, MD  ALPRAZolam Prudy Feeler(XANAX) 0.5 MG tablet Take 0.5 mg by mouth 3 (three) times daily. Dr Malvin JohnsPotter    Historical Provider, MD  ARTIFICIAL TEAR OP Apply 1 drop to eye as needed (for dry eyes).    Historical Provider, MD  aspirin EC 81 MG tablet Take 81 mg by mouth daily.    Historical Provider, MD  carbidopa-levodopa (SINEMET CR) 50-200 MG tablet Take 1 tablet by mouth at bedtime. 12/28/16   Rebecca S Tat, DO  carbidopa-levodopa (SINEMET IR) 25-100 MG tablet Take 2 tablets by mouth 6 (six) times daily. 12/28/16   Octaviano Battyebecca S Tat, DO  carvedilol (COREG) 3.125 MG tablet Take 6.25 mg by mouth 2 (two) times daily.     Historical Provider, MD  nitroGLYCERIN (NITROSTAT) 0.4 MG SL tablet Place 0.4 mg under the tongue every 5 (five) minutes as needed for chest pain.    Historical Provider, MD  rosuvastatin (CRESTOR) 20 MG tablet Take 20 mg by mouth at bedtime.    Historical Provider, MD  rotigotine (NEUPRO) 4 MG/24HR Place 1 patch onto the skin daily. 12/28/16   Octaviano Batty Tat, DO  sildenafil (VIAGRA) 100 MG tablet Take 100 mg by mouth daily as needed for erectile dysfunction.    Historical Provider, MD  ticagrelor (BRILINTA) 90 MG TABS tablet Take 90 mg by mouth every 12 (twelve) hours.    Historical Provider, MD  valACYclovir (VALTREX) 500 MG tablet Take 500 mg by mouth 2 (two) times daily.    Historical Provider, MD   Meds Ordered and Administered this Visit  Medications - No data to display  BP 123/73 (BP Location: Right Arm)   Pulse 63   Temp 98.3 F (36.8 C) (Oral)   Resp 16   Ht 5\' 11"  (1.803 m)   Wt 200 lb (90.7 kg)   SpO2 100%   BMI 27.89 kg/m  No data found.   Physical Exam   Constitutional: He is oriented to person, place, and time. He appears well-developed and well-nourished. No distress.  HENT:  Head: Normocephalic and atraumatic.  Eyes: EOM are normal. Pupils are equal, round, and reactive to light.  Neck: Normal range of motion. Neck supple.  Musculoskeletal: Normal range of motion. He exhibits edema, tenderness and deformity.  Examination of the left forearm and elbow shows some mild ecchymosis along the flexor surface. There is a small hard bump over the proximal ulna 2-3 fingerbreadths beyond the olecranon. It is essentially nontender. However an area distal to that is more tender. Is normal sensation of his fingers and hand. He has normal motors in the finger and hand.  Neurological: He is alert and oriented to person, place, and time.  Skin: Skin is warm and dry. He is not diaphoretic.  Psychiatric: He has a normal mood and affect. His behavior is normal. Judgment and thought content normal.  Nursing note and vitals reviewed.   Urgent Care Course     Procedures (including critical care time)  Labs Review Labs Reviewed - No data to display  Imaging Review Dg Elbow Complete Left  Result Date: 12/31/2016 CLINICAL DATA:  Fall EXAM: LEFT ELBOW - COMPLETE 3+ VIEW COMPARISON:  None. FINDINGS: No acute fracture. No dislocation. There is spurring at the medial epicondyles. No evidence of joint hemarthrosis. IMPRESSION: No acute bony pathology.  Chronic changes. Electronically Signed   By: Jolaine Click M.D.   On: 12/31/2016 19:11     Visual Acuity Review  Right Eye Distance:   Left Eye Distance:   Bilateral Distance:    Right Eye Near:   Left Eye Near:    Bilateral Near:         MDM   1. Fall, initial encounter   2. Contusion, elbow, with forearm, left, initial encounter    The patient have recommended that he ice the area and elevated as necessary to control pain. He did not want any pain medications do not wish to have a sling. He will  follow-up with orthopedic if he continues to have discomfort in 1-2 weeks.    Lutricia Feil, PA-C 12/31/16 1929

## 2017-01-29 ENCOUNTER — Observation Stay
Admit: 2017-01-29 | Discharge: 2017-01-29 | Disposition: A | Discharge status: Home / Self Care | Payer: BLUE CROSS/BLUE SHIELD | Attending: Internal Medicine | Admitting: Internal Medicine

## 2017-01-29 ENCOUNTER — Inpatient Hospital Stay
Admission: EM | Admit: 2017-01-29 | Discharge: 2017-01-31 | DRG: 247 | Disposition: A | Discharge status: Home / Self Care | Payer: BLUE CROSS/BLUE SHIELD | Attending: Internal Medicine | Admitting: Internal Medicine

## 2017-01-29 ENCOUNTER — Encounter: Payer: Self-pay | Admitting: Emergency Medicine

## 2017-01-29 ENCOUNTER — Emergency Department: Payer: BLUE CROSS/BLUE SHIELD

## 2017-01-29 DIAGNOSIS — I200 Unstable angina: Secondary | ICD-10-CM

## 2017-01-29 DIAGNOSIS — R0789 Other chest pain: Secondary | ICD-10-CM | POA: Diagnosis not present

## 2017-01-29 DIAGNOSIS — R7303 Prediabetes: Secondary | ICD-10-CM | POA: Diagnosis present

## 2017-01-29 DIAGNOSIS — Z7982 Long term (current) use of aspirin: Secondary | ICD-10-CM

## 2017-01-29 DIAGNOSIS — F419 Anxiety disorder, unspecified: Secondary | ICD-10-CM | POA: Diagnosis present

## 2017-01-29 DIAGNOSIS — R079 Chest pain, unspecified: Secondary | ICD-10-CM

## 2017-01-29 DIAGNOSIS — Z91041 Radiographic dye allergy status: Secondary | ICD-10-CM

## 2017-01-29 DIAGNOSIS — Z955 Presence of coronary angioplasty implant and graft: Secondary | ICD-10-CM

## 2017-01-29 DIAGNOSIS — I25810 Atherosclerosis of coronary artery bypass graft(s) without angina pectoris: Secondary | ICD-10-CM | POA: Diagnosis present

## 2017-01-29 DIAGNOSIS — G20 Parkinson's disease: Secondary | ICD-10-CM | POA: Diagnosis present

## 2017-01-29 DIAGNOSIS — Z79899 Other long term (current) drug therapy: Secondary | ICD-10-CM

## 2017-01-29 DIAGNOSIS — I2510 Atherosclerotic heart disease of native coronary artery without angina pectoris: Secondary | ICD-10-CM | POA: Diagnosis present

## 2017-01-29 DIAGNOSIS — I10 Essential (primary) hypertension: Secondary | ICD-10-CM | POA: Diagnosis present

## 2017-01-29 DIAGNOSIS — Z6829 Body mass index (BMI) 29.0-29.9, adult: Secondary | ICD-10-CM

## 2017-01-29 DIAGNOSIS — I25110 Atherosclerotic heart disease of native coronary artery with unstable angina pectoris: Secondary | ICD-10-CM | POA: Diagnosis not present

## 2017-01-29 DIAGNOSIS — E785 Hyperlipidemia, unspecified: Secondary | ICD-10-CM | POA: Diagnosis present

## 2017-01-29 DIAGNOSIS — Z882 Allergy status to sulfonamides status: Secondary | ICD-10-CM

## 2017-01-29 DIAGNOSIS — E669 Obesity, unspecified: Secondary | ICD-10-CM | POA: Diagnosis present

## 2017-01-29 LAB — CBC
HEMATOCRIT: 43.3 % (ref 40.0–52.0)
HEMOGLOBIN: 15.2 g/dL (ref 13.0–18.0)
MCH: 32.3 pg (ref 26.0–34.0)
MCHC: 35.2 g/dL (ref 32.0–36.0)
MCV: 91.9 fL (ref 80.0–100.0)
Platelets: 145 10*3/uL — ABNORMAL LOW (ref 150–440)
RBC: 4.72 MIL/uL (ref 4.40–5.90)
RDW: 15 % — ABNORMAL HIGH (ref 11.5–14.5)
WBC: 4.5 10*3/uL (ref 3.8–10.6)

## 2017-01-29 LAB — BASIC METABOLIC PANEL
ANION GAP: 5 (ref 5–15)
BUN: 17 mg/dL (ref 6–20)
CALCIUM: 9.4 mg/dL (ref 8.9–10.3)
CHLORIDE: 105 mmol/L (ref 101–111)
CO2: 28 mmol/L (ref 22–32)
Creatinine, Ser: 1.03 mg/dL (ref 0.61–1.24)
GFR calc Af Amer: >60 mL/min (ref >60)
GFR calc non Af Amer: >60 mL/min (ref >60)
GLUCOSE: 175 mg/dL — AB (ref 65–99)
POTASSIUM: 4 mmol/L (ref 3.5–5.1)
Sodium: 138 mmol/L (ref 135–145)

## 2017-01-29 LAB — TROPONIN I: Troponin I: <0.03 ng/mL (ref <0.03)

## 2017-01-29 MED ORDER — SODIUM CHLORIDE 0.9% FLUSH
3.0000 mL | Freq: Two times a day (BID) | INTRAVENOUS | Status: DC
  Administered 2017-01-29 – 2017-01-30 (×2): 3 mL via INTRAVENOUS

## 2017-01-29 MED ORDER — ASPIRIN EC 81 MG PO TBEC
81.0000 mg | DELAYED_RELEASE_TABLET | Freq: Every day | ORAL | Status: DC
  Administered 2017-01-31: 81 mg via ORAL

## 2017-01-29 MED ORDER — ROTIGOTINE 4 MG/24HR TD PT24
1.0000 | MEDICATED_PATCH | Freq: Every day | TRANSDERMAL | Status: DC
  Administered 2017-01-29 – 2017-01-30 (×2): 1 via TRANSDERMAL
  Filled 2017-01-29: qty 1

## 2017-01-29 MED ORDER — NITROGLYCERIN 0.4 MG SL SUBL: 0.4000 mg | SUBLINGUAL_TABLET | SUBLINGUAL | Status: DC | PRN

## 2017-01-29 MED ORDER — ROSUVASTATIN CALCIUM 20 MG PO TABS
20.0000 mg | ORAL_TABLET | Freq: Every day | ORAL | Status: DC
  Administered 2017-01-29 – 2017-01-30 (×2): 20 mg via ORAL
  Filled 2017-01-29 (×2): qty 1

## 2017-01-29 MED ORDER — MORPHINE SULFATE (PF) 4 MG/ML IV SOLN: 2.0000 mg | INTRAVENOUS | Status: DC | PRN

## 2017-01-29 MED ORDER — VALACYCLOVIR HCL 500 MG PO TABS
500.0000 mg | ORAL_TABLET | Freq: Two times a day (BID) | ORAL | Status: DC
  Administered 2017-01-31: 500 mg via ORAL
  Filled 2017-01-29 (×6): qty 1

## 2017-01-29 MED ORDER — ACETAMINOPHEN 500 MG PO TABS: 1000.0000 mg | ORAL_TABLET | Freq: Four times a day (QID) | ORAL | Status: DC | PRN

## 2017-01-29 MED ORDER — SODIUM CHLORIDE 0.9% FLUSH: 3.0000 mL | INTRAVENOUS | Status: DC | PRN

## 2017-01-29 MED ORDER — TICAGRELOR 90 MG PO TABS
90.0000 mg | ORAL_TABLET | Freq: Two times a day (BID) | ORAL | Status: DC
  Administered 2017-01-29: 90 mg via ORAL
  Filled 2017-01-29 (×2): qty 1

## 2017-01-29 MED ORDER — OXYCODONE HCL 5 MG PO TABS
5.0000 mg | ORAL_TABLET | ORAL | Status: DC | PRN
  Administered 2017-01-29: 5 mg via ORAL
  Filled 2017-01-29: qty 1

## 2017-01-29 MED ORDER — SODIUM CHLORIDE 0.9 % IV SOLN: 250.0000 mL | INTRAVENOUS | Status: DC | PRN

## 2017-01-29 MED ORDER — ASPIRIN 81 MG PO CHEW: 324.0000 mg | CHEWABLE_TABLET | Freq: Once | ORAL | Status: DC

## 2017-01-29 MED ORDER — SODIUM CHLORIDE 0.9 % WEIGHT BASED INFUSION: 3.0000 mL/kg/h | INTRAVENOUS | Status: AC

## 2017-01-29 MED ORDER — ALBUTEROL SULFATE HFA 108 (90 BASE) MCG/ACT IN AERS: 2.0000 | INHALATION_SPRAY | Freq: Four times a day (QID) | RESPIRATORY_TRACT | Status: DC | PRN

## 2017-01-29 MED ORDER — ALBUTEROL SULFATE (2.5 MG/3ML) 0.083% IN NEBU: 2.5000 mg | INHALATION_SOLUTION | Freq: Four times a day (QID) | RESPIRATORY_TRACT | Status: DC | PRN

## 2017-01-29 MED ORDER — CARBIDOPA-LEVODOPA 25-100 MG PO TABS
2.0000 | ORAL_TABLET | Freq: Every day | ORAL | Status: DC
  Administered 2017-01-29 – 2017-01-31 (×11): 2 via ORAL
  Filled 2017-01-29 (×11): qty 2

## 2017-01-29 MED ORDER — TICAGRELOR 90 MG PO TABS: 90.0000 mg | ORAL_TABLET | Freq: Once | ORAL | Status: DC

## 2017-01-29 MED ORDER — ALPRAZOLAM 0.5 MG PO TABS
0.5000 mg | ORAL_TABLET | Freq: Three times a day (TID) | ORAL | Status: DC
  Administered 2017-01-29 – 2017-01-31 (×6): 0.5 mg via ORAL
  Filled 2017-01-29 (×7): qty 1

## 2017-01-29 MED ORDER — ACETAMINOPHEN 325 MG PO TABS
650.0000 mg | ORAL_TABLET | ORAL | Status: DC | PRN
  Filled 2017-01-29: qty 2

## 2017-01-29 MED ORDER — SODIUM CHLORIDE 0.9 % WEIGHT BASED INFUSION: 1.0000 mL/kg/h | INTRAVENOUS | Status: DC

## 2017-01-29 MED ORDER — NITROGLYCERIN 2 % TD OINT
1.0000 [in_us] | TOPICAL_OINTMENT | Freq: Once | TRANSDERMAL | Status: AC
  Administered 2017-01-29: 1 [in_us] via TOPICAL
  Filled 2017-01-29: qty 1

## 2017-01-29 MED ORDER — CARVEDILOL 6.25 MG PO TABS
ORAL_TABLET | ORAL | Status: AC
  Administered 2017-01-29: 6.25 mg via ORAL
  Filled 2017-01-29: qty 1

## 2017-01-29 MED ORDER — ONDANSETRON HCL 4 MG/2ML IJ SOLN
4.0000 mg | Freq: Four times a day (QID) | INTRAMUSCULAR | Status: DC | PRN
  Administered 2017-01-30: 4 mg via INTRAVENOUS
  Filled 2017-01-29: qty 2

## 2017-01-29 MED ORDER — ENOXAPARIN SODIUM 40 MG/0.4ML ~~LOC~~ SOLN
40.0000 mg | SUBCUTANEOUS | Status: DC
  Administered 2017-01-30: 40 mg via SUBCUTANEOUS
  Filled 2017-01-29: qty 0.4

## 2017-01-29 MED ORDER — CARBIDOPA-LEVODOPA ER 50-200 MG PO TBCR
1.0000 | EXTENDED_RELEASE_TABLET | Freq: Every day | ORAL | Status: DC
  Administered 2017-01-29 – 2017-01-30 (×2): 1 via ORAL
  Filled 2017-01-29 (×2): qty 1

## 2017-01-29 MED ORDER — ASPIRIN 81 MG PO CHEW
81.0000 mg | CHEWABLE_TABLET | ORAL | Status: AC
  Administered 2017-01-30: 81 mg via ORAL
  Filled 2017-01-29: qty 1

## 2017-01-29 MED ORDER — PERFLUTREN LIPID MICROSPHERE
1.0000 mL | INTRAVENOUS | Status: AC | PRN
  Administered 2017-01-29: 2 mL via INTRAVENOUS
  Filled 2017-01-29: qty 10

## 2017-01-29 MED ORDER — CARVEDILOL 6.25 MG PO TABS
6.2500 mg | ORAL_TABLET | Freq: Two times a day (BID) | ORAL | Status: DC
  Administered 2017-01-29 – 2017-01-31 (×3): 6.25 mg via ORAL
  Filled 2017-01-29 (×4): qty 1

## 2017-01-29 NOTE — ED Triage Notes (Addendum)
Pt to triage via w/c with no distress noted; st since 130am having substernal CP, nonradiating, denies any accomp symptoms; st hx of same; st pain only with exertion at present; took NTG PTA with relief

## 2017-01-29 NOTE — ED Provider Notes (Signed)
[facility] Emergency Department Provider Note        Time seen: ----------------------------------------- 8:57 AM on 01/29/2017 -----------------------------------------    I have reviewed the triage vital signs and the nursing notes.   HISTORY  Chief Complaint Chest Pain    HPI Sean Barajas is a 61 y.o. male who presents to the ER for onset of chest pain at 1:30 this morning. Patient describes substernal chest pain was nonradiating. Patient describes it like elephant was sitting on his chest. Patient currently only has pain with exertion and shortness of breath with exertion. He took nitroglycerin prior to arrival with complete resolution of his symptoms. Reportedly he had been seen by cardiologist recently and was due to have a treadmill stress test. Patient denies fevers, chills or recent illness. 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   Past Medical History:  Diagnosis Date  . Anxiety   . Coronary artery disease   . Hyperlipidemia   . Hypertension   . Parkinson's disease (HCC)   . Prediabetes     Patient Active Problem List   Diagnosis Date Noted  . Aortic atherosclerosis (HCC) 08/28/2016  . Prediabetes 06/25/2016    Past Surgical History:  Procedure Laterality Date  . APPENDECTOMY    . CARDIAC CATHETERIZATION    . CAROTID STENT  07/2015  . CHOLECYSTECTOMY    . CORONARY ANGIOPLASTY    . CORONARY ARTERY BYPASS GRAFT    . EYE SURGERY Bilateral   . NASAL SEPTUM SURGERY    . TONSILLECTOMY      Allergies Iodinated diagnostic agents and Sulfa antibiotics  Social History Social History  Substance Use Topics  . Smoking status: Never Smoker  . Smokeless tobacco: Never Used  . Alcohol use 0.0 oz/week     Comment: 1 glass wine/month    Review of Systems Constitutional: Negative for fever. Cardiovascular: Positive for recent chest pain Respiratory: Positive shortness of  breath Gastrointestinal: Negative for abdominal pain, vomiting and diarrhea. Genitourinary: Negative for dysuria. Musculoskeletal: Negative for back pain. Skin: Negative for rash. Neurological: Negative for headaches, focal weakness or numbness.  10-point ROS otherwise negative.  ____________________________________________   PHYSICAL EXAM:  VITAL SIGNS: ED Triage Vitals  Enc Vitals Group     BP 01/29/17 0606 (!) 151/82     Pulse Rate 01/29/17 0606 (!) 58     Resp 01/29/17 0606 18     Temp 01/29/17 0606 97.9 F (36.6 C)     Temp Source 01/29/17 0606 Oral     SpO2 01/29/17 0606 97 %     Weight 01/29/17 0559 208 lb (94.3 kg)     Height 01/29/17 0559 5\' 11"  (1.803 m)     Head Circumference --      Peak Flow --      Pain Score --      Pain Loc --      Pain Edu? --      Excl. in GC? --     Constitutional: Alert and oriented. Well appearing and in no distress. Eyes: Conjunctivae are Injected bilaterally PERRL. Normal extraocular movements. ENT   Head: Normocephalic and atraumatic.   Nose: No congestion/rhinnorhea.   Mouth/Throat: Mucous membranes are moist.   Neck: No stridor. Cardiovascular: Normal rate, regular rhythm. No murmurs, rubs, or gallops. Respiratory: Normal respiratory effort without tachypnea nor retractions. Breath sounds are clear and equal bilaterally. No wheezes/rales/rhonchi. Gastrointestinal: Soft and nontender. Normal bowel  sounds Musculoskeletal: Nontender with normal range of motion in all extremities. No lower extremity tenderness nor edema. Neurologic:  Mildly slurred speech, No gross focal neurologic deficits are appreciated.  Skin:  Skin is warm, dry and intact. No rash noted. Psychiatric: Mood and affect are normal. ____________________________________________  EKG: Interpreted by me. Sinus bradycardia with rate of 59 bpm, normal PR interval, normal QRS, normal QT. Left axis  deviation.  ____________________________________________  ED COURSE:  Pertinent labs & imaging results that were available during my care of the patient were reviewed by me and considered in my medical decision making (see chart for details). Patient presents to ER for chest pain and is found to be hypertensive. We will assess with labs and imaging. He'll be given nitro paste and is encouraged to take his home medications including Brilinta and aspirin   Procedures ____________________________________________   LABS (pertinent positives/negatives)  Labs Reviewed  BASIC METABOLIC PANEL - Abnormal; Notable for the following:       Result Value   Glucose, Bld 175 (*)    All other components within normal limits  CBC - Abnormal; Notable for the following:    RDW 15.0 (*)    Platelets 145 (*)    All other components within normal limits  TROPONIN I    RADIOLOGY Chest x-ray  IMPRESSION: No acute cardiopulmonary process seen.   ____________________________________________  FINAL ASSESSMENT AND PLAN  Chest pain, hypertension   Plan: Patient with labs and imaging as dictated above. Patient presented to the ER mostly with symptoms of stable angina but now had severe unstable angina with the extensive cardiac history. He is also hypertensive. He was given his home medications as well as nitroglycerin. I will discuss with the hospitalist for admission as he will likely need stress testing.   Emily Filbert, MD   Note: This note was generated in part or whole with voice recognition software. Voice recognition is usually quite accurate but there are transcription errors that can and very often do occur. I apologize for any typographical errors that were not detected and corrected.     Emily Filbert, MD 01/29/17 [phone]

## 2017-01-29 NOTE — ED Notes (Signed)
Pt[place] sister ([place] is concerned about clots.

## 2017-01-29 NOTE — H&P (Signed)
[name] - [place] at [facility]   PATIENT NAME: Sean Barajas    MR#:  [id number]  DATE OF BIRTH:  [birth year]  DATE OF ADMISSION:  01/29/2017  PRIMARY CARE PHYSICIAN: Elizabeth Sauer, MD   REQUESTING/REFERRING PHYSICIAN: Rosezella Rumpf  CHIEF COMPLAINT:  Chest pain  HISTORY OF PRESENT ILLNESS:  Sean Barajas is a 61 y.o.  male with past medical history of coronary artery disease status post CABG on aspirin and Brilinta sees Dr. Evette Georges as an outpatient came to the ER for onset of chest pain at 1:30 this morning. Patient describes substernal chest pain was nonradiating. Patient describes it like elephant was sitting on his chest. Initial troponin and EKG are negative. Patient is chest pain-free during my examination Reportedly he had been seen by cardiologist recently and was due to have a treadmill stress test. Patient denies fevers, chills or recent illness.   PAST MEDICAL HISTORY:   Past Medical History:  Diagnosis Date  . Anxiety   . Coronary artery disease   . Hyperlipidemia   . Hypertension   . Parkinson's disease (HCC)   . Prediabetes     PAST SURGICAL HISTOIRY:   Past Surgical History:  Procedure Laterality Date  . APPENDECTOMY    . CARDIAC CATHETERIZATION    . CAROTID STENT  07/2015  . CHOLECYSTECTOMY    . CORONARY ANGIOPLASTY    . CORONARY ARTERY BYPASS GRAFT    . EYE SURGERY Bilateral   . NASAL SEPTUM SURGERY    . TONSILLECTOMY      SOCIAL HISTORY:   Social History  Substance Use Topics  . Smoking status: Never Smoker  . Smokeless tobacco: Never Used  . Alcohol use 0.0 oz/week     Comment: 1 glass wine/month    FAMILY HISTORY:  Heart conditions and in hIS family  DRUG ALLERGIES:   Allergies  Allergen Reactions  . Iodinated Diagnostic Agents Nausea Only and Other (See Comments)    Reaction:  Fever   . Sulfa Antibiotics Rash    REVIEW OF SYSTEMS:  CONSTITUTIONAL: No fever, fatigue or weakness.  EYES:  No blurred or double vision.  EARS, NOSE, AND THROAT: No tinnitus or ear pain. 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  CARDIOVASCULAR: No chest pain, orthopnea, edema.  GASTROINTESTINAL: No nausea, vomiting, diarrhea or abdominal pain.  GENITOURINARY: No dysuria, hematuria.  ENDOCRINE: No polyuria, nocturia,  HEMATOLOGY: No anemia, easy bruising or bleeding SKIN: No rash or lesion. MUSCULOSKELETAL: No joint pain or arthritis.   NEUROLOGIC: No tingling, numbness, weakness.  PSYCHIATRY: No anxiety or depression.   MEDICATIONS AT HOME:   Prior to Admission medications   Medication Sig Start Date End Date Taking? Authorizing Provider  acetaminophen (TYLENOL) 500 MG tablet Take 1,000 mg by mouth every 6 (six) hours as needed for mild pain.   Yes Historical Provider, MD  albuterol (PROVENTIL HFA;VENTOLIN HFA) 108 (90 BASE) MCG/ACT inhaler Inhale 2 puffs into the lungs every 6 (six) hours as needed for wheezing or shortness of breath.   Yes Historical Provider, MD  ALPRAZolam Prudy Feeler) 0.5 MG tablet Take 0.5 mg by mouth 3 (three) times daily. Dr Malvin Johns   Yes Historical Provider, MD  ARTIFICIAL TEAR OP Apply 1 drop to eye as needed (for dry eyes).   Yes Historical Provider, MD  aspirin EC 81 MG tablet Take 81 mg by mouth daily.  Yes Historical Provider, MD  carbidopa-levodopa (SINEMET CR) 50-200 MG tablet Take 1 tablet by mouth at bedtime. 12/28/16  Yes Rebecca S Tat, DO  carbidopa-levodopa (SINEMET IR) 25-100 MG tablet Take 2 tablets by mouth 6 (six) times daily. 12/28/16  Yes Rebecca S Tat, DO  carvedilol (COREG) 6.25 MG tablet Take 6.25 mg by mouth 2 (two) times daily.    Yes Historical Provider, MD  nitroGLYCERIN (NITROSTAT) 0.4 MG SL tablet Place 0.4 mg under the tongue every 5 (five) minutes as needed for chest pain.   Yes Historical Provider, MD  Oxycodone HCl 10 MG TABS Take 5 mg by  mouth every 4 (four) hours as needed (pain).   Yes Historical Provider, MD  phenol (CHLORASEPTIC) 1.4 % LIQD Use as directed 1 spray in the mouth or throat as needed for throat irritation / pain.   Yes Historical Provider, MD  rosuvastatin (CRESTOR) 20 MG tablet Take 20 mg by mouth at bedtime.   Yes Historical Provider, MD  rotigotine (NEUPRO) 4 MG/24HR Place 1 patch onto the skin daily. 12/28/16  Yes Rebecca S Tat, DO  sildenafil (VIAGRA) 100 MG tablet Take 100 mg by mouth daily as needed for erectile dysfunction.   Yes Historical Provider, MD  ticagrelor (BRILINTA) 90 MG TABS tablet Take 90 mg by mouth every 12 (twelve) hours.   Yes Historical Provider, MD  valACYclovir (VALTREX) 500 MG tablet Take 500 mg by mouth 2 (two) times daily.   Yes Historical Provider, MD      VITAL SIGNS:  Blood pressure (!) 156/87, pulse (!) 56, temperature 97.8 F (36.6 C), temperature source Oral, resp. rate 16, height 5\' 11"  (1.803 m), weight 94.3 kg (208 lb), SpO2 98 %.  PHYSICAL EXAMINATION:  GENERAL:  61 y.o.-year-old patient lying in the bed with no acute distress.  EYES: Pupils equal, round, reactive to light and accommodation. No scleral icterus. Extraocular muscles intact.  HEENT: Head atraumatic, normocephalic. Oropharynx and nasopharynx clear. Basal cell cancer resection on the, with Steri-Strips NECK:  Supple, no jugular venous distention. No thyroid enlargement, no tenderness.  LUNGS: Normal breath sounds bilaterally, no wheezing, rales,rhonchi or crepitation. No use of accessory muscles of respiration.  CARDIOVASCULAR: S1, S2 normal. No murmurs, rubs, or gallops.  ABDOMEN: Soft, nontender, nondistended. Bowel sounds present. No organomegaly or mass.  EXTREMITIES: No pedal edema, cyanosis, or clubbing.  NEUROLOGIC: Cranial nerves II through XII are intact. Muscle strength 5/5 in all extremities. Sensation intact. Gait not checked.  PSYCHIATRIC: The patient is alert and oriented x 3.  SKIN: No  obvious rash, lesion, or ulcer.   LABORATORY PANEL:   CBC  Recent Labs Lab 01/29/17 0606  WBC 4.5  HGB 15.2  HCT 43.3  PLT 145*   ------------------------------------------------------------------------------------------------------------------  Chemistries   Recent Labs Lab 01/29/17 0606  NA 138  K 4.0  CL 105  CO2 28  GLUCOSE 175*  BUN 17  CREATININE 1.03  CALCIUM 9.4   ------------------------------------------------------------------------------------------------------------------  Cardiac Enzymes  Recent Labs Lab 01/29/17 1354  TROPONINI <0.03   ------------------------------------------------------------------------------------------------------------------  RADIOLOGY:  Dg Chest 2 View  Result Date: 01/29/2017 CLINICAL DATA:  Acute onset of substernal chest pain. Initial encounter. EXAM: CHEST  2 VIEW COMPARISON:  Chest radiograph performed 09/27/2015 FINDINGS: The lungs are well-aerated. Mild left basilar scarring is noted. There is no evidence of focal opacification, pleural effusion or pneumothorax. The heart is normal in size; the patient is status post median sternotomy. No acute osseous abnormalities are seen. Bridging anterior osteophytes  are noted along the thoracic spine. IMPRESSION: No acute cardiopulmonary process seen. Electronically Signed   By: Roanna Raider M.D.   On: 01/29/2017 06:56    EKG:   Orders placed or performed during the hospital encounter of 01/29/17  . EKG 12-Lead  . EKG 12-Lead  . ED EKG within 10 minutes  . ED EKG within 10 minutes    IMPRESSION AND PLAN:   ORYN CASANOVA is a 61 y.o.  male with past medical history of coronary artery disease status post CABG on aspirin and Brilinta sees Dr. Evette Georges as an outpatient came to the ER for onset of chest pain at 1:30 this morning. Patient describes substernal chest pain was nonradiating. Patient describes it like elephant was sitting on his chest. Initial troponin and EKG  are negative  #Chest pain with history of coronary artery disease status post CABG Admit to telemetry Cycle cardiac biomarkers. Initial troponin is negative Consult [facility] cardiology Cardiac stress test in a.m. Continue aspirin, Brilinta, beta blocker and statin echo  #Hypertension-continue Coreg  #Hyperlipidemia continue statin check fasting lipid panel in a.m.  #Chronic history of Parkinson's disease continue carbidopa levodopa  GI and DVT prophylaxis  All the records are reviewed and case discussed with ED provider. Management plans discussed with the patient, family and they are in agreement.  CODE STATUS: FC ,Brother is the healthcare power of attorney  TOTAL TIME TAKING CARE OF THIS PATIENT: 43 minutes.   Note: This dictation was prepared with Dragon dictation along with smaller phrase technology. Any transcriptional errors that result from this process are unintentional.  Ramonita Lab M.D on 01/29/2017 at 4:42 PM  Between 7am to 6pm - Pager - [phone]  After 6pm go to [email or web address] - password [email or web address] [facility]  [place] [facility]  Office  [phone]  CC: Primary care physician; Elizabeth Sauer, MD

## 2017-01-29 NOTE — Progress Notes (Signed)
Patient requesting pain medication for headache. Patient states tylenol does will not help. Patient take oxycodone 5mg  q4hrs as needed at home.  MD paged and orders obtained for oxycodone 5mg  every 4 hours as needed for moderate pain.

## 2017-01-29 NOTE — ED Notes (Signed)
Pt presents with chest pain last night (0130 am) that felt "like an elephant standing on my chest." States that he took a nitro at 0130 and it helped; he states that at 0530 it was still hurting so he took another nitro and went back to sleep as pain was relieved; pt has hx of cabg and stent plcmt; states this feels the same. Pt alert & oriented with NAD noted.

## 2017-01-30 ENCOUNTER — Encounter
Admission: EM | Disposition: A | Discharge status: Home / Self Care | Payer: Self-pay | Source: Home / Self Care | Attending: Internal Medicine

## 2017-01-30 ENCOUNTER — Other Ambulatory Visit: Payer: BLUE CROSS/BLUE SHIELD

## 2017-01-30 ENCOUNTER — Encounter: Payer: Self-pay | Admitting: *Deleted

## 2017-01-30 DIAGNOSIS — R7303 Prediabetes: Secondary | ICD-10-CM | POA: Diagnosis present

## 2017-01-30 DIAGNOSIS — R0789 Other chest pain: Secondary | ICD-10-CM | POA: Diagnosis present

## 2017-01-30 DIAGNOSIS — I10 Essential (primary) hypertension: Secondary | ICD-10-CM | POA: Diagnosis present

## 2017-01-30 DIAGNOSIS — I2510 Atherosclerotic heart disease of native coronary artery without angina pectoris: Secondary | ICD-10-CM | POA: Diagnosis present

## 2017-01-30 DIAGNOSIS — E669 Obesity, unspecified: Secondary | ICD-10-CM | POA: Diagnosis present

## 2017-01-30 DIAGNOSIS — Z882 Allergy status to sulfonamides status: Secondary | ICD-10-CM | POA: Diagnosis not present

## 2017-01-30 DIAGNOSIS — Z79899 Other long term (current) drug therapy: Secondary | ICD-10-CM | POA: Diagnosis not present

## 2017-01-30 DIAGNOSIS — Z955 Presence of coronary angioplasty implant and graft: Secondary | ICD-10-CM | POA: Diagnosis not present

## 2017-01-30 DIAGNOSIS — Z91041 Radiographic dye allergy status: Secondary | ICD-10-CM | POA: Diagnosis not present

## 2017-01-30 DIAGNOSIS — F419 Anxiety disorder, unspecified: Secondary | ICD-10-CM | POA: Diagnosis present

## 2017-01-30 DIAGNOSIS — G20 Parkinson's disease: Secondary | ICD-10-CM | POA: Diagnosis present

## 2017-01-30 DIAGNOSIS — Z7982 Long term (current) use of aspirin: Secondary | ICD-10-CM | POA: Diagnosis not present

## 2017-01-30 DIAGNOSIS — I25810 Atherosclerosis of coronary artery bypass graft(s) without angina pectoris: Secondary | ICD-10-CM | POA: Diagnosis present

## 2017-01-30 DIAGNOSIS — Z6829 Body mass index (BMI) 29.0-29.9, adult: Secondary | ICD-10-CM | POA: Diagnosis not present

## 2017-01-30 DIAGNOSIS — I25110 Atherosclerotic heart disease of native coronary artery with unstable angina pectoris: Secondary | ICD-10-CM | POA: Diagnosis present

## 2017-01-30 DIAGNOSIS — E785 Hyperlipidemia, unspecified: Secondary | ICD-10-CM | POA: Diagnosis present

## 2017-01-30 HISTORY — PX: CORONARY STENT INTERVENTION: CATH118234

## 2017-01-30 HISTORY — PX: LEFT HEART CATH AND CORS/GRAFTS ANGIOGRAPHY: CATH118250

## 2017-01-30 LAB — LIPID PANEL
Cholesterol: 145 mg/dL (ref 0–200)
HDL: 36 mg/dL — ABNORMAL LOW (ref >40)
LDL Cholesterol: 83 mg/dL (ref 0–99)
Total CHOL/HDL Ratio: 4 RATIO
Triglycerides: 129 mg/dL (ref <150)
VLDL: 26 mg/dL (ref 0–40)

## 2017-01-30 LAB — ECHOCARDIOGRAM COMPLETE
Height: 71 in
Weight: 3328 oz

## 2017-01-30 LAB — POCT ACTIVATED CLOTTING TIME: Activated Clotting Time: 362 seconds

## 2017-01-30 LAB — HIV ANTIBODY (ROUTINE TESTING W REFLEX): HIV SCREEN 4TH GENERATION: NONREACTIVE

## 2017-01-30 SURGERY — LEFT HEART CATH AND CORS/GRAFTS ANGIOGRAPHY
Anesthesia: Moderate Sedation

## 2017-01-30 MED ORDER — ACETAMINOPHEN 325 MG PO TABS
650.0000 mg | ORAL_TABLET | ORAL | Status: DC | PRN
  Administered 2017-01-31: 650 mg via ORAL

## 2017-01-30 MED ORDER — ASPIRIN 81 MG PO CHEW
CHEWABLE_TABLET | ORAL | Status: AC
  Filled 2017-01-30: qty 3

## 2017-01-30 MED ORDER — ONDANSETRON HCL 4 MG/2ML IJ SOLN: 4.0000 mg | Freq: Four times a day (QID) | INTRAMUSCULAR | Status: DC | PRN

## 2017-01-30 MED ORDER — SODIUM CHLORIDE 0.9 % IV SOLN
INTRAVENOUS | Status: DC
  Administered 2017-01-30 – 2017-01-31 (×3): via INTRAVENOUS

## 2017-01-30 MED ORDER — NITROGLYCERIN 1 MG/10 ML FOR IR/CATH LAB
INTRA_ARTERIAL | Status: DC | PRN
  Administered 2017-01-30: 200 ug via INTRACORONARY

## 2017-01-30 MED ORDER — BIVALIRUDIN BOLUS VIA INFUSION - CUPID
INTRAVENOUS | Status: DC | PRN
  Administered 2017-01-30: 70.725 mg via INTRAVENOUS

## 2017-01-30 MED ORDER — SODIUM CHLORIDE 0.9 % IV SOLN
INTRAVENOUS | Status: DC | PRN
  Administered 2017-01-30: 1.75 mg/kg/h via INTRAVENOUS

## 2017-01-30 MED ORDER — METHYLPREDNISOLONE SODIUM SUCC 125 MG IJ SOLR
INTRAMUSCULAR | Status: AC
  Filled 2017-01-30: qty 2

## 2017-01-30 MED ORDER — NITROGLYCERIN 5 MG/ML IV SOLN
INTRAVENOUS | Status: AC
  Filled 2017-01-30: qty 10

## 2017-01-30 MED ORDER — BIVALIRUDIN 250 MG IV SOLR
INTRAVENOUS | Status: AC
  Filled 2017-01-30: qty 250

## 2017-01-30 MED ORDER — TICAGRELOR 90 MG PO TABS
ORAL_TABLET | ORAL | Status: DC | PRN
  Administered 2017-01-30: 180 mg via ORAL

## 2017-01-30 MED ORDER — HYDRALAZINE HCL 20 MG/ML IJ SOLN: 5.0000 mg | INTRAMUSCULAR | Status: AC | PRN

## 2017-01-30 MED ORDER — FENTANYL CITRATE (PF) 100 MCG/2ML IJ SOLN
INTRAMUSCULAR | Status: AC
Start: 2017-01-30 — End: 2017-01-30
  Filled 2017-01-30: qty 2

## 2017-01-30 MED ORDER — MIDAZOLAM HCL 2 MG/2ML IJ SOLN
INTRAMUSCULAR | Status: DC | PRN
  Administered 2017-01-30: 1 mg via INTRAVENOUS

## 2017-01-30 MED ORDER — SODIUM CHLORIDE 0.9 % IV SOLN: 250.0000 mL | INTRAVENOUS | Status: DC | PRN

## 2017-01-30 MED ORDER — TIROFIBAN (AGGRASTAT) BOLUS VIA INFUSION
INTRAVENOUS | Status: DC | PRN
  Administered 2017-01-30: 2357.5 ug via INTRAVENOUS

## 2017-01-30 MED ORDER — TIROFIBAN HCL IN NACL 5-0.9 MG/100ML-% IV SOLN
INTRAVENOUS | Status: DC | PRN
  Administered 2017-01-30: 0.15 ug/kg/min via INTRAVENOUS

## 2017-01-30 MED ORDER — TICAGRELOR 90 MG PO TABS
90.0000 mg | ORAL_TABLET | Freq: Two times a day (BID) | ORAL | Status: DC
Start: 2017-01-30 — End: 2017-01-31
  Administered 2017-01-30 – 2017-01-31 (×3): 90 mg via ORAL
  Filled 2017-01-30 (×2): qty 1

## 2017-01-30 MED ORDER — METHYLPREDNISOLONE SODIUM SUCC 125 MG IJ SOLR
125.0000 mg | Freq: Once | INTRAMUSCULAR | Status: AC
Start: 2017-01-30 — End: 2017-01-30
  Administered 2017-01-30: 125 mg via INTRAVENOUS

## 2017-01-30 MED ORDER — ASPIRIN 81 MG PO CHEW
81.0000 mg | CHEWABLE_TABLET | Freq: Every day | ORAL | Status: DC
  Administered 2017-01-31: 81 mg via ORAL
  Filled 2017-01-30 (×2): qty 1

## 2017-01-30 MED ORDER — MIDAZOLAM HCL 2 MG/2ML IJ SOLN
INTRAMUSCULAR | Status: AC
  Filled 2017-01-30: qty 2

## 2017-01-30 MED ORDER — SODIUM CHLORIDE 0.9% FLUSH: 3.0000 mL | INTRAVENOUS | Status: DC | PRN

## 2017-01-30 MED ORDER — SODIUM CHLORIDE 0.9 % WEIGHT BASED INFUSION: 1.0000 mL/kg/h | INTRAVENOUS | Status: AC

## 2017-01-30 MED ORDER — TICAGRELOR 90 MG PO TABS
ORAL_TABLET | ORAL | Status: AC
  Filled 2017-01-30: qty 2

## 2017-01-30 MED ORDER — SODIUM CHLORIDE 0.9% FLUSH
3.0000 mL | Freq: Two times a day (BID) | INTRAVENOUS | Status: DC
  Administered 2017-01-30 – 2017-01-31 (×2): 3 mL via INTRAVENOUS

## 2017-01-30 MED ORDER — ASPIRIN 81 MG PO CHEW
CHEWABLE_TABLET | ORAL | Status: DC | PRN
  Administered 2017-01-30: 324 mg via ORAL

## 2017-01-30 MED ORDER — TIROFIBAN HCL IN NACL 5-0.9 MG/100ML-% IV SOLN
0.1500 ug/kg/min | INTRAVENOUS | Status: AC
  Administered 2017-01-30 (×2): 0.15 ug/kg/min via INTRAVENOUS
  Filled 2017-01-30 (×3): qty 100

## 2017-01-30 MED ORDER — TIROFIBAN HCL IN NACL 5-0.9 MG/100ML-% IV SOLN
INTRAVENOUS | Status: AC
  Filled 2017-01-30: qty 200

## 2017-01-30 MED ORDER — HEPARIN (PORCINE) IN NACL 2-0.9 UNIT/ML-% IJ SOLN
INTRAMUSCULAR | Status: AC
  Filled 2017-01-30: qty 500

## 2017-01-30 MED ORDER — FENTANYL CITRATE (PF) 100 MCG/2ML IJ SOLN
INTRAMUSCULAR | Status: DC | PRN
  Administered 2017-01-30: 25 ug via INTRAVENOUS

## 2017-01-30 MED ORDER — LABETALOL HCL 5 MG/ML IV SOLN: 10.0000 mg | INTRAVENOUS | Status: AC | PRN

## 2017-01-30 SURGICAL SUPPLY — 19 items
BALLN TREK RX 2.25X12 (BALLOONS) ×2
BALLOON TREK RX 2.25X12 (BALLOONS) ×1 IMPLANT
CATH 5FR JL4 DIAGNOSTIC (CATHETERS) ×2 IMPLANT
CATH INFINITI 5 FR IM (CATHETERS) ×2 IMPLANT
CATH INFINITI 5FR ANG PIGTAIL (CATHETERS) ×2 IMPLANT
CATH INFINITI JR4 5F (CATHETERS) ×2 IMPLANT
CATH VISTA GUIDE 6FR XB3.5 (CATHETERS) ×2 IMPLANT
DEVICE CLOSURE MYNXGRIP 6/7F (Vascular Products) ×2 IMPLANT
DEVICE INFLAT 30 PLUS (MISCELLANEOUS) ×2 IMPLANT
KIT MANI 3VAL PERCEP (MISCELLANEOUS) ×2 IMPLANT
NEEDLE PERC 18GX7CM (NEEDLE) ×2 IMPLANT
PACK CARDIAC CATH (CUSTOM PROCEDURE TRAY) ×2 IMPLANT
SHEATH AVANTI 5FR X 11CM (SHEATH) ×2 IMPLANT
SHEATH AVANTI 6FR X 11CM (SHEATH) ×2 IMPLANT
STENT RESOLUTE INTEG 2.25X12 (Permanent Stent) ×4 IMPLANT
WIRE ASAHI PROWATER 180CM (WIRE) ×2 IMPLANT
WIRE EMERALD 3MM-J .035X150CM (WIRE) ×2 IMPLANT
WIRE EMERALD 3MM-J .035X260CM (WIRE) ×2 IMPLANT
WIRE G HI TQ BMW 190 (WIRE) ×2 IMPLANT

## 2017-01-30 NOTE — Progress Notes (Signed)
[facility] - [place] at [facility]   PATIENT NAME: Sean Barajas    MR#:  [id number]  DATE OF BIRTH:  1956-03-28  SUBJECTIVE:  CHIEF COMPLAINT:   Chief Complaint  Patient presents with  . Chest Pain  Not having much chest pain although he does still report having minimal chest pain on exertion REVIEW OF SYSTEMS:  Review of Systems  Constitutional: Negative for chills, fever and weight loss.  HENT: Negative for nosebleeds and sore throat.   Eyes: Negative for blurred vision.  Respiratory: Negative for cough, shortness of breath and wheezing.   Cardiovascular: Positive for chest pain. Negative for orthopnea, leg swelling and PND.  Gastrointestinal: Negative for abdominal pain, constipation, diarrhea, heartburn, nausea and vomiting.  Genitourinary: Negative for dysuria and urgency.  Musculoskeletal: Negative for back pain.  Skin: Negative for rash.  Neurological: Negative for dizziness, speech change, focal weakness and headaches.  Endo/Heme/Allergies: Does not bruise/bleed easily.  Psychiatric/Behavioral: Negative for depression.    DRUG ALLERGIES:   Allergies  Allergen Reactions  . Iodinated Diagnostic Agents Nausea Only and Other (See Comments)    Reaction:  Fever   . Sulfa Antibiotics Rash   VITALS:  Blood pressure 140/81, pulse (!) 59, temperature 97.7 F (36.5 C), temperature source Oral, resp. rate 16, height 5\' 11"  (1.803 m), weight 94.3 kg (208 lb), SpO2 93 %. PHYSICAL EXAMINATION:  Physical Exam  Constitutional: He is oriented to person, place, and time and well-developed, well-nourished, and in no distress.  HENT:  Head: Normocephalic and atraumatic.  Eyes: Conjunctivae and EOM are normal. Pupils are equal, round, and reactive to light.  Neck: Normal range of motion. Neck supple. No tracheal deviation present. No thyromegaly present.  Cardiovascular: Normal rate, regular rhythm and normal heart sounds.   Pulmonary/Chest: Effort normal and  breath sounds normal. No respiratory distress. He has no wheezes. He exhibits no tenderness.  Abdominal: Soft. Bowel sounds are normal. He exhibits no distension. There is no tenderness.  Musculoskeletal: Normal range of motion.  Neurological: He is alert and oriented to person, place, and time. No cranial nerve deficit.  Skin: Skin is warm and dry. No rash noted.  Psychiatric: Mood and affect normal.   LABORATORY PANEL:   CBC  Recent Labs Lab 01/29/17 0606  WBC 4.5  HGB 15.2  HCT 43.3  PLT 145*   ------------------------------------------------------------------------------------------------------------------ Chemistries   Recent Labs Lab 01/29/17 0606  NA 138  K 4.0  CL 105  CO2 28  GLUCOSE 175*  BUN 17  CREATININE 1.03  CALCIUM 9.4   RADIOLOGY:  No results found. ASSESSMENT AND PLAN:  Jartavious Mckimmy Whitfieldis a 61 y.o. male with past medical history of coronary artery disease status post CABG on aspirin and Brilinta sees Dr. Evette Georges as an outpatient came to the ER for onset of chest pain at 1:30 this morning. Patient describes substernal chest pain was nonradiating. Patient describes it like elephant was sitting on his chest. Initial troponin and EKG are negative  #Chest pain with history of coronary artery disease status post CABG -Cardiac cath earlier today showing three-vessel coronary disease. had a successful PCI and stenting of the proximal left circumflex and OM 2 1. Three-vessel coronary artery disease, with 99% stenosis proximal LAD, 75% stenosis of the proximal left circumflex, 80% stenosis OM 2, and occluded RCA. 2. Patent LIMA to LAD, patent SVG to PDA, and chronically occluded SVG OM 2 3. Overall normal left ventricular ejection fraction with mild inferior wall hypokinesis 4.  Successful PCI with DES to proximal left circumflex and OM 2 Continue aspirin, Brilinta, beta blocker and statin  #Hypertension-continue Coreg  #Hyperlipidemia: Needs high  intensity statin  #Chronic history of Parkinson's disease continue carbidopa levodopa      All the records are reviewed and case discussed with Care Management/Social Worker. Management plans discussed with the patient, family and they are in agreement.  CODE STATUS: Full Code  TOTAL TIME TAKING CARE OF THIS PATIENT: 35 minutes.   More than 50% of the time was spent in counseling/coordination of care: YES  POSSIBLE D/C IN 1 DAYS, DEPENDING ON CLINICAL CONDITION.   Delfino LovettVipul Shaye Lagace M.D on 01/30/2017 at 5:39 PM  Between 7am to 6pm - Pager - [phone]  After 6pm go to [email or web address] - Social research officer, [email or web address] [email or web address]  [facility] [place] [facility]  Office  [phone]  CC: Primary care physician; Elizabeth Sauereanna Jones, MD  Note: This dictation was prepared with Dragon dictation along with smaller phrase technology. Any transcriptional errors that result from this process are unintentional.

## 2017-01-30 NOTE — Care Management (Signed)
Patient verbally confirmed that he was on Brilinta prior to admission.  has been on it for at least one year.

## 2017-01-30 NOTE — Progress Notes (Signed)
Patient ambulated in the hall post heart cath. Left femoral site stable no signs of bleeding or hematoma noted. Will continue to monitor.

## 2017-01-30 NOTE — Consult Note (Signed)
Reason for Consult:Unstable angina,  Referring Physician: Dr Margaretmary Eddy hospitalist,  Cardiologist Dr Amparo Bristol Sean Barajas is an 61 y.o. male.  HPI: Pt is a 61 y/o WM with history of CAD/CABG has history of unstable angina as CABG at [facility] in August 2008 patient last year had failure bypass graft coronary PCI and stent.. The patient states that he's had recent onset of unstable angina chest pain symptoms she is like when he had his stent placed at [facility]. The patient states that he's followed by Dr. Saralyn Pilar and recently saw him but now is here with recurrent chest pain he's limited by his Parkinson's  Past Medical History:  Diagnosis Date  . Anxiety   . Coronary artery disease   . Hyperlipidemia   . Hypertension   . Parkinson's disease ([place])   . Prediabetes     Past Surgical History:  Procedure Laterality Date  . APPENDECTOMY    . CARDIAC CATHETERIZATION    . CAROTID STENT  07/2015  . CHOLECYSTECTOMY    . CORONARY ANGIOPLASTY    . CORONARY ARTERY BYPASS GRAFT    . EYE SURGERY Bilateral   . NASAL SEPTUM SURGERY    . TONSILLECTOMY      History reviewed. No pertinent family history.  Social History:  reports that he has never smoked. He has never used smokeless tobacco. He reports that he drinks alcohol. He reports that he uses drugs.  Allergies:  Allergies  Allergen Reactions  . Iodinated Diagnostic Agents Nausea Only and Other (See Comments)    Reaction:  Fever   . Sulfa Antibiotics Rash    Medications: I have reviewed the patient's current medications.  Results for orders placed or performed during the hospital encounter of 01/29/17 (from the past 48 hour(s))  Basic metabolic panel     Status: Abnormal   Collection Time: 01/29/17  6:06 AM  Result Value Ref Range   Sodium 138 135 - 145 mmol/L   Potassium 4.0 3.5 - 5.1 mmol/L   Chloride 105 101 - 111 mmol/L   CO2 28 22 - 32 mmol/L   Glucose, Bld 175 (H) 65 - 99 mg/dL   BUN 17 6 - 20 mg/dL   Creatinine, Ser 1.03  0.61 - 1.24 mg/dL   Calcium 9.4 8.9 - 10.3 mg/dL   GFR calc non Af Amer >60 >60 mL/min   GFR calc Af Amer >60 >60 mL/min    Comment: (NOTE) The eGFR has been calculated using the CKD EPI equation. This calculation has not been validated in all clinical situations. eGFR's persistently <60 mL/min signify possible Chronic Kidney Disease.    Anion gap 5 5 - 15  CBC     Status: Abnormal   Collection Time: 01/29/17  6:06 AM  Result Value Ref Range   WBC 4.5 3.8 - 10.6 K/uL   RBC 4.72 4.40 - 5.90 MIL/uL   Hemoglobin 15.2 13.0 - 18.0 g/dL   HCT 43.3 40.0 - 52.0 %   MCV 91.9 80.0 - 100.0 fL   MCH 32.3 26.0 - 34.0 pg   MCHC 35.2 32.0 - 36.0 g/dL   RDW 15.0 (H) 11.5 - 14.5 %   Platelets 145 (L) 150 - 440 K/uL  Troponin I     Status: None   Collection Time: 01/29/17  6:06 AM  Result Value Ref Range   Troponin I <0.03 <0.03 ng/mL  HIV antibody (Routine Testing)     Status: None   Collection Time: 01/29/17 11:47 AM  Result Value Ref Range   HIV Screen 4th Generation wRfx Non Reactive Non Reactive    Comment: (NOTE) Performed At: [facility] [place], [place] [id number] Lindon Romp MD EY:[id number]   Troponin I-serum (0, 3, 6 hours)     Status: None   Collection Time: 01/29/17 11:47 AM  Result Value Ref Range   Troponin I <0.03 <0.03 ng/mL  Troponin I-serum (0, 3, 6 hours)     Status: None   Collection Time: 01/29/17  1:54 PM  Result Value Ref Range   Troponin I <0.03 <0.03 ng/mL  Troponin I-serum (0, 3, 6 hours)     Status: None   Collection Time: 01/29/17  4:46 PM  Result Value Ref Range   Troponin I <0.03 <0.03 ng/mL  Lipid panel     Status: Abnormal   Collection Time: 01/30/17  3:13 AM  Result Value Ref Range   Cholesterol 145 0 - 200 mg/dL   Triglycerides 129 <150 mg/dL   HDL 36 (L) >40 mg/dL   Total CHOL/HDL Ratio 4.0 RATIO   VLDL 26 0 - 40 mg/dL   LDL Cholesterol 83 0 - 99 mg/dL    Comment:        Total Cholesterol/HDL:CHD Risk Coronary  Heart Disease Risk Table                     Men   Women  1/2 Average Risk   3.4   3.3  Average Risk       5.0   4.4  2 X Average Risk   9.6   7.1  3 X Average Risk  23.4   11.0        Use the calculated Patient Ratio above and the CHD Risk Table to determine the patient's CHD Risk.        ATP III CLASSIFICATION (LDL):  <100     mg/dL   Optimal  100-129  mg/dL   Near or Above                    Optimal  130-159  mg/dL   Borderline  160-189  mg/dL   High  >190     mg/dL   Very High     Dg Chest 2 View  Result Date: 01/29/2017 CLINICAL DATA:  Acute onset of substernal chest pain. Initial encounter. EXAM: CHEST  2 VIEW COMPARISON:  Chest radiograph performed 09/27/2015 FINDINGS: The lungs are well-aerated. Mild left basilar scarring is noted. There is no evidence of focal opacification, pleural effusion or pneumothorax. The heart is normal in size; the patient is status post median sternotomy. No acute osseous abnormalities are seen. Bridging anterior osteophytes are noted along the thoracic spine. IMPRESSION: No acute cardiopulmonary process seen. Electronically Signed   By: Garald Balding M.D.   On: 01/29/2017 06:56    Review of Systems  Constitutional: Positive for malaise/fatigue.  HENT: Positive for congestion.   Eyes: Negative.   Respiratory: Positive for shortness of breath.   Cardiovascular: Positive for chest pain and orthopnea.  Gastrointestinal: Positive for heartburn.  Genitourinary: Negative.   Musculoskeletal: Positive for falls, joint pain and myalgias.  Skin: Negative.   Neurological: Positive for dizziness, speech change and weakness.  Endo/Heme/Allergies: Negative.    Blood pressure (!) 155/84, pulse 69, temperature 97.7 F (36.5 C), temperature source Oral, resp. rate 17, height _0  (1.803 m), weight 94.3 kg (208 lb), SpO2 95 %. Physical Exam  Nursing note and vitals reviewed. Constitutional: He is oriented to person, place, and time. He appears  well-developed and well-nourished.  HENT:  Head: Normocephalic and atraumatic.  Eyes: Conjunctivae and EOM are normal. Pupils are equal, round, and reactive to light.  Neck: Normal range of motion. Neck supple.  Cardiovascular: Normal rate, regular rhythm and normal heart sounds.   Respiratory: Effort normal and breath sounds normal.  GI: Soft. Bowel sounds are normal.  Musculoskeletal: Normal range of motion.  Neurological: He is alert and oriented to person, place, and time. He displays abnormal reflex. He exhibits abnormal muscle tone. Coordination abnormal.  Skin: Skin is warm and dry.  Psychiatric: He has a normal mood and affect.    Assessment/Plan: Unstable angina Coronary artery disease Coronary bypass surgery Parkinson's disease Hypertension Generalized weakness Anxiety Obesity Hyperlipidemia . Plan Agree with rule out for myocardial infarction Continue EKGs and troponins Consider echocardiogram for assessment of left ventricular function DVT prophylaxis Commend cardiac catheter for assessment of coronary disease and bypass grafts Follow-up bradycardia no clear indication for permanent pacemaker Continue therapy for Parkinson's  Dwayne D Callwood 01/30/2017, 7:59 AM

## 2017-01-31 ENCOUNTER — Encounter: Payer: Self-pay | Admitting: Cardiology

## 2017-01-31 ENCOUNTER — Telehealth: Payer: Self-pay | Admitting: Neurology

## 2017-01-31 LAB — BASIC METABOLIC PANEL
Anion gap: 6 (ref 5–15)
BUN: 17 mg/dL (ref 6–20)
CHLORIDE: 107 mmol/L (ref 101–111)
CO2: 25 mmol/L (ref 22–32)
Calcium: 8.9 mg/dL (ref 8.9–10.3)
Creatinine, Ser: 0.98 mg/dL (ref 0.61–1.24)
Glucose, Bld: 183 mg/dL — ABNORMAL HIGH (ref 65–99)
POTASSIUM: 3.9 mmol/L (ref 3.5–5.1)
SODIUM: 138 mmol/L (ref 135–145)

## 2017-01-31 LAB — CBC
HEMATOCRIT: 38.5 % — AB (ref 40.0–52.0)
Hemoglobin: 13.5 g/dL (ref 13.0–18.0)
MCH: 32.1 pg (ref 26.0–34.0)
MCHC: 35.1 g/dL (ref 32.0–36.0)
MCV: 91.4 fL (ref 80.0–100.0)
PLATELETS: 138 10*3/uL — AB (ref 150–440)
RBC: 4.21 MIL/uL — AB (ref 4.40–5.90)
RDW: 14.7 % — AB (ref 11.5–14.5)
WBC: 10 10*3/uL (ref 3.8–10.6)

## 2017-01-31 MED ORDER — ISOSORBIDE MONONITRATE ER 30 MG PO TB24: 30.0000 mg | ORAL_TABLET | Freq: Every day | ORAL | 0 refills | Status: DC

## 2017-01-31 MED ORDER — LISINOPRIL 2.5 MG PO TABS: 2.5000 mg | ORAL_TABLET | Freq: Every day | ORAL | 0 refills | Status: DC

## 2017-01-31 MED ORDER — ROTIGOTINE 4 MG/24HR TD PT24: 1.0000 | MEDICATED_PATCH | Freq: Every day | TRANSDERMAL | 0 refills | Status: DC

## 2017-01-31 NOTE — Progress Notes (Signed)
[facility] [place] at [facility]        Ted McalpineRobert Kohlmann was admitted to the Hospital on 01/29/2017 and Discharged  01/31/2017 and should be excused from work  for 5 days starting 01/29/2017 , may return to work without any restrictions on 02/02/2017.  Delfino LovettVipul Lemond Griffee M.D on 01/31/2017,at 8:07 AM  [facility] [place] at [facility]    Office  [phone]

## 2017-01-31 NOTE — Telephone Encounter (Signed)
We have not heard from patient since we gave him samples of Neupro to see how he is doing on medication. I tried to call patient and spoke with his brother and sister and they state he is admitted to [facility] room 231. I called [place] and the patient has been discharged from the hospital.  I sent in one month of Neupro patches with note to pharmacy that patient needs to contact us to discuss how he is doing before any further refills.

## 2017-01-31 NOTE — Progress Notes (Signed)
Patient is post heart cath via right groin. The cath site remained clean dry and intact without any hematoma, and patient denied  Pain. Patient has no acute event overnight, NSR with stable VS. Family was at patient's bedside overnight.

## 2017-01-31 NOTE — Telephone Encounter (Signed)
Sean Barajas 10/21/1956. He is needing a prescription for a new 24 hour patch (Neupro) in place of a pill that was given to him. He uses [facility] [place] # [phone]. He has been in the hospital and they cannot find his. He would like you to call him if able to call it in. Thank you

## 2017-01-31 NOTE — Discharge Instructions (Signed)
Coronary Angiogram With Stent °Coronary angiogram with stent placement is a procedure to widen or open a narrow blood vessel of the heart (coronary artery). Arteries may become blocked by cholesterol buildup (plaques) in the lining or wall. When a coronary artery becomes partially blocked, blood flow to that area decreases. This may lead to chest pain or a heart attack (myocardial infarction). °A stent is a small piece of metal that looks like mesh or a spring. Stent placement may be done as treatment for a heart attack or right after a coronary angiogram in which a blocked artery is found. °Let your health care provider know about: °· Any allergies you have. °· All medicines you are taking, including vitamins, herbs, eye drops, creams, and over-the-counter medicines. °· Any problems you or family members have had with anesthetic medicines. °· Any blood disorders you have. °· Any surgeries you have had. °· Any medical conditions you have. °· Whether you are pregnant or may be pregnant. °What are the risks? °Generally, this is a safe procedure. However, problems may occur, including: °· Damage to the heart or its blood vessels. °· A return of blockage. °· Bleeding, infection, or bruising at the insertion site. °· A collection of blood under the skin (hematoma) at the insertion site. °· A blood clot in another part of the body. °· Kidney injury. °· Allergic reaction to the dye or contrast that is used. °· Bleeding into the abdomen (retroperitoneal bleeding). ° °What happens before the procedure? °Staying hydrated °Follow instructions from your health care provider about hydration, which may include: °· Up to 2 hours before the procedure - you may continue to drink clear liquids, such as water, clear fruit juice, black coffee, and plain tea. ° °Eating and drinking restrictions °Follow instructions from your health care provider about eating and drinking, which may include: °· 8 hours before the procedure - stop eating  heavy meals or foods such as meat, fried foods, or fatty foods. °· 6 hours before the procedure - stop eating light meals or foods, such as toast or cereal. °· 2 hours before the procedure - stop drinking clear liquids. ° °Ask your health care provider about: °· Changing or stopping your regular medicines. This is especially important if you are taking diabetes medicines or blood thinners. °· Taking medicines such as ibuprofen. These medicines can thin your blood. Do not take these medicines before your procedure if your health care provider instructs you not to. Generally, aspirin is recommended before a procedure of passing a small, thin tube (catheter) through a blood vessel and into the heart (cardiac catheterization). ° °What happens during the procedure? °· An IV tube will be inserted into one of your veins. °· You will be given one or more of the following: °? A medicine to help you relax (sedative). °? A medicine to numb the area where the catheter will be inserted into an artery (local anesthetic). °· To reduce your risk of infection: °? Your health care team will wash or sanitize their hands. °? Your skin will be washed with soap. °? Hair may be removed from the area where the catheter will be inserted. °· Using a guide wire, the catheter will be inserted into an artery. The location may be in your groin, in your wrist, or in the fold of your arm (near your elbow). °· A type of X-ray (fluoroscopy) will be used to help guide the catheter to the opening of the arteries in the heart. °· A   dye will be injected into the catheter, and X-rays will be taken. The dye will help to show where any narrowing or blockages are located in the arteries. °· A tiny wire will be guided to the blocked spot, and a balloon will be inflated to make the artery wider. °· The stent will be expanded and will crush the plaques into the wall of the vessel. The stent will hold the area open and improve the blood flow. Most stents have a  drug coating to reduce the risk of the stent narrowing over time. °· The artery may be made wider using a drill, laser, or other tools to remove plaques. °· When the blood flow is better, the catheter will be removed. The lining of the artery will grow over the stent, which stays where it was placed. °This procedure may vary among health care providers and hospitals. °What happens after the procedure? °· If the procedure is done through the leg, you will be kept in bed lying flat for about 6 hours. You will be instructed to not bend and not cross your legs. °· The insertion site will be checked frequently. °· The pulse in your foot or wrist will be checked frequently. °· You may have additional blood tests, X-rays, and a test that records the electrical activity of your heart (electrocardiogram, or ECG). °This information is not intended to replace advice given to you by your health care provider. Make sure you discuss any questions you have with your health care provider. °Document Released: 05/26/2003 Document Revised: 07/19/2016 Document Reviewed: 06/24/2016 °Elsevier Interactive Patient Education © 2017 Elsevier Inc. ° °

## 2017-01-31 NOTE — Progress Notes (Signed)
Discharge instructions along with home medication list and follow up gone over with patient. Patient verbalized that he understood instructions. Iv removed x2 and telemetry. Prescriptions electronically submitted. Vascular instructions gone over with patient. Home medications returned to patient. Stent card and mynx brochure given to patient. No c/o pain patient discharged home on ra.

## 2017-02-01 ENCOUNTER — Telehealth: Payer: Self-pay | Admitting: Neurology

## 2017-02-01 NOTE — Telephone Encounter (Signed)
Patient sister Dois Davenportsandra called and wants to check on the status of the medication pranipexole in patch form please call [phone]

## 2017-02-01 NOTE — Discharge Summary (Signed)
5        [facility] [place] at [facility]   PATIENT NAME: Sean Barajas    MR#:  [id number]  DATE OF BIRTH:  1956-09-05  DATE OF ADMISSION:  01/29/2017   ADMITTING PHYSICIAN: Ramonita Lab, MD  DATE OF DISCHARGE: 01/31/2017 11:41 AM  PRIMARY CARE PHYSICIAN: Elizabeth Sauer, MD   ADMISSION DIAGNOSIS:  Nonspecific chest pain [R07.9] Hypertension, unspecified type [I10] DISCHARGE DIAGNOSIS:  Active Problems:   Unstable angina (HCC)   CAD (coronary artery disease)  SECONDARY DIAGNOSIS:   Past Medical History:  Diagnosis Date  . Anxiety   . Coronary artery disease   . Hyperlipidemia   . Hypertension   . Parkinson's disease (H[facility])   . Prediabetes    HOSPITAL COURSE:  Tarek Cravens Whitfieldis a 61 y.o.male with past medical history of coronary artery disease status post CABG on aspirin and Brilinta sees Dr. Kenyon Ana an outpatient [place] the [place] for onset of chest pain at 1:30 this morning. Patient describes substernal chest pain was nonradiating. Patient describes it like elephant was sitting on his chest.Initial troponin and EKG are negative  # Unstable Angina with history of coronary artery disease status post CABG -Cardiac cath showing three-vessel coronary disease. had a successful PCI and stenting of the proximal left circumflex and OM 2 - Three-vessel coronary artery disease, with 99% stenosis proximal LAD, 75% stenosis of the proximal left circumflex, 80% stenosis OM 2, and occluded RCA. Patent LIMA to LAD, patent SVG to PDA, and chronically occluded SVG OM 2. Overall normal left ventricular ejection fraction with mild inferior wall hypokinesis. Successful PCI with DES to proximal left circumflex and OM 2. Continue aspirin, Brilinta, beta blocker and statin  DISCHARGE CONDITIONS:  stable CONSULTS OBTAINED:  Treatment Team:  Alwyn Pea, MD DRUG ALLERGIES:   Allergies  Allergen Reactions  . Iodinated Diagnostic Agents Nausea Only and Other  (See Comments)    Reaction:  Fever   . Sulfa Antibiotics Rash   DISCHARGE MEDICATIONS:   Allergies as of 01/31/2017      Reactions   Iodinated Diagnostic Agents Nausea Only, Other (See Comments)   Reaction:  Fever    Sulfa Antibiotics Rash      Medication List    TAKE these medications   acetaminophen 500 MG tablet Commonly known as:  TYLENOL Take 1,000 mg by mouth every 6 (six) hours as needed for mild pain.   albuterol 108 (90 Base) MCG/ACT inhaler Commonly known as:  PROVENTIL HFA;VENTOLIN HFA Inhale 2 puffs into the lungs every 6 (six) hours as needed for wheezing or shortness of breath.   ALPRAZolam 0.5 MG tablet Commonly known as:  XANAX Take 0.5 mg by mouth 3 (three) times daily. Dr Malvin Johns   ARTIFICIAL TEAR OP Apply 1 drop to eye as needed (for dry eyes).   aspirin EC 81 MG tablet Take 81 mg by mouth daily.   carbidopa-levodopa 50-200 MG tablet Commonly known as:  SINEMET CR Take 1 tablet by mouth at bedtime.   carbidopa-levodopa 25-100 MG tablet Commonly known as:  SINEMET IR Take 2 tablets by mouth 6 (six) times daily.   carvedilol 6.25 MG tablet Commonly known as:  COREG Take 6.25 mg by mouth 2 (two) times daily.   isosorbide mononitrate 30 MG 24 hr tablet Commonly known as:  IMDUR Take 1 tablet (30 mg total) by mouth daily.   lisinopril 2.5 MG tablet Commonly known as:  ZESTRIL Take 1 tablet (2.5 mg total) by  mouth daily.   nitroGLYCERIN 0.4 MG SL tablet Commonly known as:  NITROSTAT Place 0.4 mg under the tongue every 5 (five) minutes as needed for chest pain.   Oxycodone HCl 10 MG Tabs Take 5 mg by mouth every 4 (four) hours as needed (pain).   phenol 1.4 % Liqd Commonly known as:  CHLORASEPTIC Use as directed 1 spray in the mouth or throat as needed for throat irritation / pain.   rosuvastatin 20 MG tablet Commonly known as:  CRESTOR Take 20 mg by mouth at bedtime.   sildenafil 100 MG tablet Commonly known as:  VIAGRA Take 100 mg by  mouth daily as needed for erectile dysfunction.   ticagrelor 90 MG Tabs tablet Commonly known as:  BRILINTA Take 90 mg by mouth every 12 (twelve) hours.   VALTREX 500 MG tablet Generic drug:  valACYclovir Take 500 mg by mouth 2 (two) times daily.      DISCHARGE INSTRUCTIONS:   DIET:  Regular diet DISCHARGE CONDITION:  Good ACTIVITY:  Activity as tolerated OXYGEN:  Home Oxygen: No.  Oxygen Delivery: room air DISCHARGE LOCATION:  home   If you experience worsening of your admission symptoms, develop shortness of breath, life threatening emergency, suicidal or homicidal thoughts you must seek medical attention immediately by calling 911 or calling your MD immediately  if symptoms less severe.  You Must read complete instructions/literature along with all the possible adverse reactions/side effects for all the Medicines you take and that have been prescribed to you. Take any new Medicines after you have completely understood and accpet all the possible adverse reactions/side effects.   Please note  You were cared for by a hospitalist during your hospital stay. If you have any questions about your discharge medications or the care you received while you were in the hospital after you are discharged, you can call the unit and asked to speak with the hospitalist on call if the hospitalist that took care of you is not available. Once you are discharged, your primary care physician will handle any further medical issues. Please note that NO REFILLS for any discharge medications will be authorized once you are discharged, as it is imperative that you return to your primary care physician (or establish a relationship with a primary care physician if you do not have one) for your aftercare needs so that they can reassess your need for medications and monitor your lab values.    On the day of Discharge:  VITAL SIGNS:  Blood pressure (!) 141/85, pulse 66, temperature 98 F (36.7 C),  temperature source Oral, resp. rate 18, height 5\' 11"  (1.803 m), weight 94.3 kg (208 lb), SpO2 96 %. PHYSICAL EXAMINATION:  GENERAL:  61 y.o.-year-old patient lying in the bed with no acute distress.  EYES: Pupils equal, round, reactive to light and accommodation. No scleral icterus. Extraocular muscles intact.  HEENT: Head atraumatic, normocephalic. Oropharynx and nasopharynx clear.  NECK:  Supple, no jugular venous distention. No thyroid enlargement, no tenderness.  LUNGS: Normal breath sounds bilaterally, no wheezing, rales,rhonchi or crepitation. No use of accessory muscles of respiration.  CARDIOVASCULAR: S1, S2 normal. No murmurs, rubs, or gallops.  ABDOMEN: Soft, non-tender, non-distended. Bowel sounds present. No organomegaly or mass.  EXTREMITIES: No pedal edema, cyanosis, or clubbing.  NEUROLOGIC: Cranial nerves II through XII are intact. Muscle strength 5/5 in all extremities. Sensation intact. Gait not checked.  PSYCHIATRIC: The patient is alert and oriented x 3.  SKIN: No obvious rash, lesion, or ulcer.  DATA REVIEW:   CBC  Recent Labs Lab 01/31/17 0413  WBC 10.0  HGB 13.5  HCT 38.5*  PLT 138*    Chemistries   Recent Labs Lab 01/31/17 0413  NA 138  K 3.9  CL 107  CO2 25  GLUCOSE 183*  BUN 17  CREATININE 0.98  CALCIUM 8.9    Follow-up Information    Elizabeth Sauer, MD. Go on 02/07/2017.   Specialty:  Family Medicine Why:  Appointment Time: 9:30am Contact information: [address] [place] [address] [phone]        Marcina Millard, MD. Go on 02/14/2017.   Specialty:  Cardiology Why:  Appointment Time: 2:15pm Contact information: [address] [facility] [place] [address] [phone]           Management plans discussed with the patient, family and they are in agreement.  CODE STATUS: Full Code   TOTAL TIME TAKING CARE OF THIS PATIENT: 45 minutes.    Delfino Lovett M.D on 02/01/2017 at 2:06  PM  Between 7am to 6pm - Pager - [phone]  After 6pm go to [email or web address] - Social research officer, government  [facility] [place] [facility]  Office  [phone]  CC: Primary care physician; Elizabeth Sauer, MD   Note: This dictation was prepared with Dragon dictation along with smaller phrase technology. Any transcriptional errors that result from this process are unintentional.

## 2017-02-01 NOTE — Telephone Encounter (Signed)
Spoke with patient and sister - made them aware I did send in this prescription yesterday but could not get a hold of them.   I needed to see how patient was feeling on Neupro since we had only given him samples.   He states he is feeling great on Neupro 4 mg patches. He is no longer having drowsiness and it is controlling his symptoms well. [facility]

## 2017-02-07 ENCOUNTER — Encounter: Payer: Self-pay | Admitting: Family Medicine

## 2017-02-07 ENCOUNTER — Ambulatory Visit (INDEPENDENT_AMBULATORY_CARE_PROVIDER_SITE_OTHER): Payer: BLUE CROSS/BLUE SHIELD | Admitting: Family Medicine

## 2017-02-07 VITALS — BP 120/76 | HR 60 | Ht 70.0 in | Wt 213.0 lb

## 2017-02-07 DIAGNOSIS — E782 Mixed hyperlipidemia: Secondary | ICD-10-CM

## 2017-02-07 DIAGNOSIS — I2510 Atherosclerotic heart disease of native coronary artery without angina pectoris: Secondary | ICD-10-CM | POA: Diagnosis not present

## 2017-02-07 DIAGNOSIS — G20A1 Parkinson's disease without dyskinesia, without mention of fluctuations: Secondary | ICD-10-CM

## 2017-02-07 DIAGNOSIS — I10 Essential (primary) hypertension: Secondary | ICD-10-CM | POA: Diagnosis not present

## 2017-02-07 DIAGNOSIS — E1159 Type 2 diabetes mellitus with other circulatory complications: Secondary | ICD-10-CM

## 2017-02-07 DIAGNOSIS — R7303 Prediabetes: Secondary | ICD-10-CM | POA: Diagnosis not present

## 2017-02-07 DIAGNOSIS — G20 Parkinson's disease: Secondary | ICD-10-CM

## 2017-02-07 DIAGNOSIS — I152 Hypertension secondary to endocrine disorders: Secondary | ICD-10-CM

## 2017-02-07 DIAGNOSIS — Z09 Encounter for follow-up examination after completed treatment for conditions other than malignant neoplasm: Secondary | ICD-10-CM

## 2017-02-07 NOTE — Progress Notes (Signed)
Name: Sean Barajas   MRN: [id number]    DOB: 1956/07/16   Date:02/07/2017       Progress Note  Subjective  Chief Complaint  Chief Complaint  Patient presents with  . Follow-up    d/c from hosp on 02/01/17    Patient followup from admission for unstable angina. Patient also needs neurology appt after starting Neupro.     Heart Problem  This is a chronic problem. The current episode started more than 1 year ago. The problem occurs intermittently. The problem has been gradually improving. Pertinent negatives include no abdominal pain, chest pain, chills, congestion, coughing, fatigue, fever, headaches, myalgias, nausea, neck pain, rash or sore throat. Nothing aggravates the symptoms. Treatments tried: asa/brilinta/statin antihypertension. The treatment provided moderate relief.    No problem-specific Assessment & Plan notes found for this encounter.   Past Medical History:  Diagnosis Date  . Anxiety   . Coronary artery disease   . Hyperlipidemia   . Hypertension   . Parkinson's disease (HCC)   . Prediabetes     Past Surgical History:  Procedure Laterality Date  . APPENDECTOMY    . CARDIAC CATHETERIZATION    . CAROTID STENT  07/2015  . CHOLECYSTECTOMY    . CORONARY ANGIOPLASTY    . CORONARY ARTERY BYPASS GRAFT    . CORONARY STENT INTERVENTION N/A 01/30/2017   Procedure: Coronary Stent Intervention;  Surgeon: Marcina Millard, MD;  Location: [facility]IVE [facility];  Service: Cardiovascular;  Laterality: N/A;  . EYE SURGERY Bilateral   . LEFT HEART CATH AND CORS/GRAFTS ANGIOGRAPHY N/A 01/30/2017   Procedure: Left Heart Cath and Cors/Grafts Angiography and possible PCI;  Surgeon: Marcina Millard, MD;  Location: [facility];  Service: Cardiovascular;  Laterality: N/A;  . NASAL SEPTUM SURGERY    . TONSILLECTOMY      No family history on file.  Social History   Social History  . Marital status: Single    Spouse name: N/A  . Number of children: N/A  . Years  of education: N/A   Occupational History  . self employed     toe truck driver   Social History Main Topics  . Smoking status: Never Smoker  . Smokeless tobacco: Never Used  . Alcohol use 0.0 oz/week     Comment: 1 glass wine/month  . Drug use: Yes     Comment: hemp oil  . Sexual activity: Not on file   Other Topics Concern  . Not on file   Social History Narrative  . No narrative on file    Allergies  Allergen Reactions  . Iodinated Diagnostic Agents Nausea Only and Other (See Comments)    Reaction:  Fever   . Sulfa Antibiotics Rash    Outpatient Medications Prior to Visit  Medication Sig Dispense Refill  . acetaminophen (TYLENOL) 500 MG tablet Take 1,000 mg by mouth every 6 (six) hours as needed for mild pain.    Marland Kitchen albuterol (PROVENTIL HFA;VENTOLIN HFA) 108 (90 BASE) MCG/ACT inhaler Inhale 2 puffs into the lungs every 6 (six) hours as needed for wheezing or shortness of breath.    . ALPRAZolam (XANAX) 0.5 MG tablet Take 0.5 mg by mouth 3 (three) times daily. Dr Malvin Johns    . ARTIFICIAL TEAR OP Apply 1 drop to eye as needed (for dry eyes).    Marland Kitchen aspirin EC 81 MG tablet Take 81 mg by mouth daily.    . carbidopa-levodopa (SINEMET CR) 50-200 MG tablet Take 1 tablet by  mouth at bedtime. 90 tablet 1  . carbidopa-levodopa (SINEMET IR) 25-100 MG tablet Take 2 tablets by mouth 6 (six) times daily. 1080 tablet 1  . carvedilol (COREG) 6.25 MG tablet Take 6.25 mg by mouth 2 (two) times daily.     . isosorbide mononitrate (IMDUR) 30 MG 24 hr tablet Take 1 tablet (30 mg total) by mouth daily. 30 tablet 0  . lisinopril (ZESTRIL) 2.5 MG tablet Take 1 tablet (2.5 mg total) by mouth daily. 30 tablet 0  . nitroGLYCERIN (NITROSTAT) 0.4 MG SL tablet Place 0.4 mg under the tongue every 5 (five) minutes as needed for chest pain.    . phenol (CHLORASEPTIC) 1.4 % LIQD Use as directed 1 spray in the mouth or throat as needed for throat irritation / pain.    . rosuvastatin (CRESTOR) 20 MG tablet Take  20 mg by mouth at bedtime.    . rotigotine (NEUPRO) 4 MG/24HR Place 1 patch onto the skin daily. 30 patch 0  . sildenafil (VIAGRA) 100 MG tablet Take 100 mg by mouth daily as needed for erectile dysfunction.    . ticagrelor (BRILINTA) 90 MG TABS tablet Take 90 mg by mouth every 12 (twelve) hours.    . valACYclovir (VALTREX) 500 MG tablet Take 500 mg by mouth 2 (two) times daily.    . Oxycodone HCl 10 MG TABS Take 5 mg by mouth every 4 (four) hours as needed (pain).     No facility-administered medications prior to visit.     Review of Systems  Constitutional: Negative for chills, fatigue, fever, malaise/fatigue and weight loss.  HENT: Negative for congestion, ear discharge, ear pain and sore throat.   Eyes: Negative for blurred vision.  Respiratory: Negative for cough, sputum production, shortness of breath and wheezing.   Cardiovascular: Negative for chest pain, palpitations and leg swelling.  Gastrointestinal: Negative for abdominal pain, blood in stool, constipation, diarrhea, heartburn, melena and nausea.  Genitourinary: Negative for dysuria, frequency, hematuria and urgency.  Musculoskeletal: Negative for back pain, joint pain, myalgias and neck pain.  Skin: Negative for rash.  Neurological: Negative for dizziness, tingling, sensory change, focal weakness and headaches.  Endo/Heme/Allergies: Negative for environmental allergies and polydipsia. Does not bruise/bleed easily.  Psychiatric/Behavioral: Negative for depression and suicidal ideas. The patient is not nervous/anxious and does not have insomnia.      Objective  Vitals:   02/07/17 0945  BP: 120/76  Pulse: 60  Weight: 213 lb (96.6 kg)  Height: 5\' 10"  (1.778 m)    Physical Exam  Constitutional: He is oriented to person, place, and time and well-developed, well-nourished, and in no distress.  HENT:  Head: Normocephalic.  Right Ear: External ear normal.  Left Ear: External ear normal.  Nose: Nose normal.   Mouth/Throat: Oropharynx is clear and moist.  Eyes: Conjunctivae and EOM are normal. Pupils are equal, round, and reactive to light. Right eye exhibits no discharge. Left eye exhibits no discharge. No scleral icterus.  Neck: Normal range of motion. Neck supple. No JVD present. No tracheal deviation present. No thyromegaly present.  Cardiovascular: Normal rate, regular rhythm, normal heart sounds and intact distal pulses.  Exam reveals no gallop and no friction rub.   No murmur heard. Pulmonary/Chest: Breath sounds normal. No respiratory distress. He has no wheezes. He has no rales.  Abdominal: Soft. Bowel sounds are normal. He exhibits no mass. There is no hepatosplenomegaly. There is no tenderness. There is no rebound, no guarding and no CVA tenderness.  Musculoskeletal: Normal  range of motion. He exhibits no edema or tenderness.  Lymphadenopathy:    He has no cervical adenopathy.  Neurological: He is alert and oriented to person, place, and time. He has normal sensation, normal strength, normal reflexes and intact cranial nerves. No cranial nerve deficit.  Skin: Skin is warm. No rash noted.  Psychiatric: Mood and affect normal.      Assessment & Plan  Problem List Items Addressed This Visit      Cardiovascular and Mediastinum   CAD (coronary artery disease)     Other   Prediabetes    Other Visit Diagnoses    Hospital discharge follow-up    -  Primary   Hypertension associated with diabetes (HCC)       cont coreg   Mixed hyperlipidemia       Parkinson disease (HCC)       Relevant Orders   Ambulatory referral to Neurology      No orders of the defined types were placed in this encounter.     Dr. Hayden [facility]  02/07/17

## 2017-02-10 ENCOUNTER — Telehealth: Payer: Self-pay | Admitting: Neurology

## 2017-02-10 NOTE — Telephone Encounter (Signed)
-----   Message from Lenoria Chimeenise S Beasley sent at 02/07/2017 10:42 AM EST ----- Regarding: MEDICATION REFILL Contact: [phone] I am sending this message on behalf of Dr. Elizabeth Sauereanna Jones. Mr Sean Barajas was in our office today and needs a refill on his  Neupro Patch. He has a follow up appointment with you on 03/19/17. He will be out of the patches by then. He stated that they are working really well for him. IF you have any questions, feel free to contact our office.  Thank you

## 2017-02-10 NOTE — Telephone Encounter (Signed)
Reviewed staff message for RX on neupro.  Looks like this was sent on 3/2.  Lesly RubensteinJade can you make sure that they have the med and he has a f/u at some point in near future?

## 2017-02-11 NOTE — Telephone Encounter (Signed)
Patient called back and confirmed.

## 2017-02-11 NOTE — Telephone Encounter (Signed)
Left message with his brother. He will make sure patient has the medication but I did speak with patient after I sent it in and made sure he was aware.  He has a follow up in April.

## 2017-02-21 ENCOUNTER — Encounter: Payer: Self-pay | Admitting: Family Medicine

## 2017-02-21 ENCOUNTER — Ambulatory Visit (INDEPENDENT_AMBULATORY_CARE_PROVIDER_SITE_OTHER): Payer: BLUE CROSS/BLUE SHIELD | Admitting: Family Medicine

## 2017-02-21 VITALS — BP 100/62 | HR 68 | Ht 70.0 in | Wt 211.0 lb

## 2017-02-21 DIAGNOSIS — L03012 Cellulitis of left finger: Secondary | ICD-10-CM

## 2017-02-21 DIAGNOSIS — H6123 Impacted cerumen, bilateral: Secondary | ICD-10-CM

## 2017-02-21 DIAGNOSIS — S60451A Superficial foreign body of left index finger, initial encounter: Secondary | ICD-10-CM | POA: Diagnosis not present

## 2017-02-21 DIAGNOSIS — Z23 Encounter for immunization: Secondary | ICD-10-CM

## 2017-02-21 DIAGNOSIS — S61231A Puncture wound without foreign body of left index finger without damage to nail, initial encounter: Secondary | ICD-10-CM

## 2017-02-21 MED ORDER — CEPHALEXIN 500 MG PO CAPS: 500.0000 mg | ORAL_CAPSULE | Freq: Four times a day (QID) | ORAL | 0 refills | Status: DC

## 2017-02-21 NOTE — Progress Notes (Signed)
Name: Sean Barajas   MRN: [id number]    DOB: 05-Apr-1956   Date:02/21/2017       Progress Note  Subjective  Chief Complaint  Chief Complaint  Patient presents with  . Otalgia    L) ear- "feels like it's moving around and hurting"  . Hand Pain    L_ index finger- pulling cable and got a piece of cable in finger    Otalgia   There is pain in the left ear. This is a new problem. The current episode started 1 to 4 weeks ago (2 weeks ago). The problem has been gradually worsening. There has been no fever. The pain is mild. Associated symptoms include neck pain. Pertinent negatives include no abdominal pain, coughing, diarrhea, ear discharge, headaches, hearing loss, rash, rhinorrhea, sore throat or vomiting. He has tried acetaminophen and NSAIDs for the symptoms. The treatment provided mild (but tempary) relief. There is no history of hearing loss.  Hand Pain   The incident occurred more than 1 week ago. The incident occurred at work. Injury mechanism: puncture wound. The pain is present in the left fingers (index). The pain has been fluctuating since the incident. Pertinent negatives include no chest pain or tingling.    No problem-specific Assessment & Plan notes found for this encounter.   Past Medical History:  Diagnosis Date  . Anxiety   . Coronary artery disease   . Hyperlipidemia   . Hypertension   . Parkinson's disease (HCC)   . Prediabetes     Past Surgical History:  Procedure Laterality Date  . APPENDECTOMY    . CARDIAC CATHETERIZATION    . CAROTID STENT  07/2015  . CHOLECYSTECTOMY    . CORONARY ANGIOPLASTY    . CORONARY ARTERY BYPASS GRAFT    . CORONARY STENT INTERVENTION N/A 01/30/2017   Procedure: Coronary Stent Intervention;  Surgeon: Marcina Millard, MD;  Location: [facility];  Service: Cardiovascular;  Laterality: N/A;  . EYE SURGERY Bilateral   . LEFT HEART CATH AND CORS/GRAFTS ANGIOGRAPHY N/A 01/30/2017   Procedure: Left Heart Cath and  Cors/Grafts Angiography and possible PCI;  Surgeon: Marcina Millard, MD;  Location: [facility];  Service: Cardiovascular;  Laterality: N/A;  . NASAL SEPTUM SURGERY    . TONSILLECTOMY      History reviewed. No pertinent family history.  Social History   Social History  . Marital status: Single    Spouse name: N/A  . Number of children: N/A  . Years of education: N/A   Occupational History  . self employed     toe truck driver   Social History Main Topics  . Smoking status: Never Smoker  . Smokeless tobacco: Never Used  . Alcohol use 0.0 oz/week     Comment: 1 glass wine/month  . Drug use: Yes     Comment: hemp oil  . Sexual activity: Not on file   Other Topics Concern  . Not on file   Social History Narrative  . No narrative on file    Allergies  Allergen Reactions  . Iodinated Diagnostic Agents Nausea Only and Other (See Comments)    Reaction:  Fever   . Sulfa Antibiotics Rash    Outpatient Medications Prior to Visit  Medication Sig Dispense Refill  . acetaminophen (TYLENOL) 500 MG tablet Take 1,000 mg by mouth every 6 (six) hours as needed for mild pain.    Marland Kitchen albuterol (PROVENTIL HFA;VENTOLIN HFA) 108 (90 BASE) MCG/ACT inhaler Inhale 2 puffs into  the lungs every 6 (six) hours as needed for wheezing or shortness of breath.    . ALPRAZolam (XANAX) 0.5 MG tablet Take 0.5 mg by mouth 3 (three) times daily. Dr Malvin JohnsPotter    . ARTIFICIAL TEAR OP Apply 1 drop to eye as needed (for dry eyes).    Marland Kitchen. aspirin EC 81 MG tablet Take 81 mg by mouth daily.    . carbidopa-levodopa (SINEMET CR) 50-200 MG tablet Take 1 tablet by mouth at bedtime. 90 tablet 1  . carbidopa-levodopa (SINEMET IR) 25-100 MG tablet Take 2 tablets by mouth 6 (six) times daily. 1080 tablet 1  . carvedilol (COREG) 6.25 MG tablet Take 6.25 mg by mouth 2 (two) times daily.     . isosorbide mononitrate (IMDUR) 30 MG 24 hr tablet Take 1 tablet (30 mg total) by mouth daily. 30 tablet 0  . lisinopril  (ZESTRIL) 2.5 MG tablet Take 1 tablet (2.5 mg total) by mouth daily. 30 tablet 0  . nitroGLYCERIN (NITROSTAT) 0.4 MG SL tablet Place 0.4 mg under the tongue every 5 (five) minutes as needed for chest pain.    . phenol (CHLORASEPTIC) 1.4 % LIQD Use as directed 1 spray in the mouth or throat as needed for throat irritation / pain.    . rosuvastatin (CRESTOR) 20 MG tablet Take 20 mg by mouth at bedtime.    . sildenafil (VIAGRA) 100 MG tablet Take 100 mg by mouth daily as needed for erectile dysfunction.    . ticagrelor (BRILINTA) 90 MG TABS tablet Take 90 mg by mouth every 12 (twelve) hours.    . valACYclovir (VALTREX) 500 MG tablet Take 500 mg by mouth 2 (two) times daily.    . Oxycodone HCl 10 MG TABS Take 5 mg by mouth every 4 (four) hours as needed (pain).    . rotigotine (NEUPRO) 4 MG/24HR Place 1 patch onto the skin daily. (Patient not taking: Reported on 02/21/2017) 30 patch 0   No facility-administered medications prior to visit.     Review of Systems  Constitutional: Negative for chills, fever, malaise/fatigue and weight loss.  HENT: Positive for ear pain. Negative for congestion, ear discharge, hearing loss, nosebleeds, rhinorrhea, sinus pain, sore throat and tinnitus.   Eyes: Negative for blurred vision.  Respiratory: Negative for cough, sputum production, shortness of breath and wheezing.   Cardiovascular: Negative for chest pain, palpitations and leg swelling.  Gastrointestinal: Negative for abdominal pain, blood in stool, constipation, diarrhea, heartburn, melena, nausea and vomiting.  Genitourinary: Negative for dysuria, frequency, hematuria and urgency.  Musculoskeletal: Positive for neck pain. Negative for back pain, falls, joint pain and myalgias.  Skin: Negative for rash.  Neurological: Negative for dizziness, tingling, sensory change, focal weakness and headaches.  Endo/Heme/Allergies: Negative for environmental allergies and polydipsia. Does not bruise/bleed easily.   Psychiatric/Behavioral: Negative for depression and suicidal ideas. The patient is not nervous/anxious and does not have insomnia.      Objective  Vitals:   02/21/17 1018  BP: 100/62  Pulse: 68  Weight: 211 lb (95.7 kg)  Height: 5\' 10"  (1.778 m)    Physical Exam  Constitutional: He is oriented to person, place, and time and well-developed, well-nourished, and in no distress.  HENT:  Head: Normocephalic.  Right Ear: External ear normal.  Left Ear: External ear normal.  Nose: Nose normal.  Mouth/Throat: Oropharynx is clear and moist.  Cerumen impaction bilateral  Eyes: Conjunctivae and EOM are normal. Pupils are equal, round, and reactive to light. Right eye exhibits  no discharge. Left eye exhibits no discharge. No scleral icterus.  Neck: Normal range of motion. Neck supple. No JVD present. No tracheal deviation present. No thyromegaly present.  Cardiovascular: Normal rate, regular rhythm, normal heart sounds and intact distal pulses.  Exam reveals no gallop and no friction rub.   No murmur heard. Pulmonary/Chest: Breath sounds normal. No respiratory distress. He has no wheezes. He has no rales.  Abdominal: Soft. Bowel sounds are normal. He exhibits no mass. There is no hepatosplenomegaly. There is no tenderness. There is no rebound, no guarding and no CVA tenderness.  Musculoskeletal: Normal range of motion. He exhibits tenderness.  Lymphadenopathy:    He has no cervical adenopathy.  Neurological: He is alert and oriented to person, place, and time. He has normal sensation, normal strength and intact cranial nerves.  Skin: Skin is warm. No rash noted. There is erythema.  Left index/ area cleaned /gently shaved to area to exposed small area of purulence and foreign body removed /dressing applied  Psychiatric: Mood and affect normal.  Nursing note and vitals reviewed.     Assessment & Plan  Problem List Items Addressed This Visit    None    Visit Diagnoses    Puncture  wound of left index finger    -  Primary   Relevant Medications   cephALEXin (KEFLEX) 500 MG capsule   Other Relevant Orders   Tdap vaccine greater than or equal to 7yo IM (Completed)   Cellulitis of finger of left hand       Relevant Medications   cephALEXin (KEFLEX) 500 MG capsule   Foreign body of left index finger       Bilateral impacted cerumen       Need for diphtheria-tetanus-pertussis (Tdap) vaccine          Meds ordered this encounter  Medications  . cephALEXin (KEFLEX) 500 MG capsule    Sig: Take 1 capsule (500 mg total) by mouth 4 (four) times daily.    Dispense:  28 capsule    Refill:  0      Dr. Elizabeth Sauer [facility]  02/21/17

## 2017-03-14 ENCOUNTER — Ambulatory Visit (INDEPENDENT_AMBULATORY_CARE_PROVIDER_SITE_OTHER): Payer: BLUE CROSS/BLUE SHIELD | Admitting: Neurology

## 2017-03-14 ENCOUNTER — Encounter: Payer: Self-pay | Admitting: Neurology

## 2017-03-14 VITALS — BP 134/70 | HR 60 | Ht 71.0 in | Wt 212.0 lb

## 2017-03-14 DIAGNOSIS — K117 Disturbances of salivary secretion: Secondary | ICD-10-CM | POA: Diagnosis not present

## 2017-03-14 DIAGNOSIS — G20 Parkinson's disease: Secondary | ICD-10-CM | POA: Diagnosis not present

## 2017-03-14 MED ORDER — ROTIGOTINE 4 MG/24HR TD PT24: 1.0000 | MEDICATED_PATCH | Freq: Every day | TRANSDERMAL | 1 refills | Status: DC
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
## 2017-03-15 ENCOUNTER — Telehealth: Payer: Self-pay | Admitting: *Deleted

## 2017-03-15 ENCOUNTER — Other Ambulatory Visit: Payer: Self-pay

## 2017-03-15 DIAGNOSIS — H9203 Otalgia, bilateral: Secondary | ICD-10-CM

## 2017-03-15 NOTE — Telephone Encounter (Signed)
Patient called and states he was put on an antibiotic  for his ear infection  And it is not getting any better. patient states he needs another antibiotic. [facility] [address] [facility] [place]. Please advise. Thank you.

## 2017-03-15 NOTE — Telephone Encounter (Signed)
Will be sending pt to ENT He wants to see Dr Jenne Campus

## 2017-03-19 ENCOUNTER — Ambulatory Visit: Payer: BLUE CROSS/BLUE SHIELD | Admitting: Neurology

## 2017-03-21 ENCOUNTER — Telehealth: Payer: Self-pay | Admitting: Neurology

## 2017-03-21 NOTE — Telephone Encounter (Signed)
She needs something faxed [phone] to her office notes with his Diagnosis please. The reference # is [id number]. Thanks

## 2017-03-21 NOTE — Telephone Encounter (Signed)
Records faxed to [facility] about determination for Myobloc coverage.

## 2017-03-21 NOTE — Telephone Encounter (Signed)
Caller: Chanda  With [employer]  Urgent? NO  Reason for the call: She would like you to fax her some thing with his Diagnosis

## 2017-03-28 ENCOUNTER — Ambulatory Visit: Payer: BLUE CROSS/BLUE SHIELD | Admitting: Neurology

## 2017-03-29 ENCOUNTER — Telehealth: Payer: Self-pay | Admitting: Physical Therapy

## 2017-03-29 ENCOUNTER — Ambulatory Visit: Payer: BLUE CROSS/BLUE SHIELD | Admitting: Neurology

## 2017-03-29 ENCOUNTER — Ambulatory Visit: Payer: BLUE CROSS/BLUE SHIELD | Attending: Neurology | Admitting: Physical Therapy

## 2017-03-29 DIAGNOSIS — G20 Parkinson's disease: Secondary | ICD-10-CM

## 2017-03-29 NOTE — Telephone Encounter (Signed)
Order entered

## 2017-03-29 NOTE — Telephone Encounter (Signed)
Hello Dr. Arbutus Leas,  I just wanted to give you an update that Mr. Lenz was scheduled for PT evaluation today but he did not show.  I called him to reschedule and because he lives in [place] we rescheduled him for therapy at the outpatient at [facility] with one of their Parkinson's specialists.  He felt like he would be able to get to [place] more regularly than [place].  While on the phone with him I noticed he has pretty significant dysarthria and in your documentation you noted dysphagia.  Would he benefit from a speech therapy evaluation as well?  If you agree, would you mind entering an order for Speech Evaluation and Treat into Epic?  Thank you, Edman Circle, PT, DPT 03/29/17    9:07 AM

## 2017-03-29 NOTE — Addendum Note (Signed)
Addended bySilvio Pate on: 03/29/2017 12:44 PM   Modules accepted: Orders

## 2017-03-29 NOTE — Telephone Encounter (Signed)
Sean Barajas was scheduled to see [place] for PT evaluation today.  However, he did not show for his appointment.  Edman Circle, PT, noted that his address is [name] and spoke to [name] about possibly doing his therapy at [facility] in their PD program.  He agreed that might be closer, so we are working to get that scheduled for him.  Also, given some of his documented swallowing issues and his difficulty being understood on the phone, would speech therapy be beneficial as well?  Thanks, Lonia Blood, PT

## 2017-03-29 NOTE — Telephone Encounter (Signed)
Sean Barajas, if patient agreeable please schedule

## 2017-03-29 NOTE — Telephone Encounter (Signed)
Pt was scheduled for PT evaluation today but did not show for appointment.  Contacted pt who stated, "I've been having balance issues and I was planning on coming once my balance improved".  Educated pt on purpose of PT appointment to evaluate balance and treat balance impairments.    Offered to have PT evaluation rescheduled for outpatient at [facility] in [place] with Parkinson's specialist.  Pt agreed and felt it would be easier to get to [facility] more regularly than [place].  Alerted staff to contact [facility] regarding referral and have [facility] contact patient to schedule evaluation.  Edman Circle, PT, DPT 03/29/17    9:15 AM

## 2017-04-16 ENCOUNTER — Other Ambulatory Visit: Payer: Self-pay | Admitting: Urology

## 2017-04-16 DIAGNOSIS — R310 Gross hematuria: Secondary | ICD-10-CM

## 2017-04-18 ENCOUNTER — Ambulatory Visit: Payer: BLUE CROSS/BLUE SHIELD

## 2017-04-18 ENCOUNTER — Ambulatory Visit
Admission: RE | Admit: 2017-04-18 | Discharge: 2017-04-18 | Disposition: A | Discharge status: Home / Self Care | Payer: BLUE CROSS/BLUE SHIELD | Source: Ambulatory Visit | Attending: Urology | Admitting: Urology

## 2017-04-18 DIAGNOSIS — K5730 Diverticulosis of large intestine without perforation or abscess without bleeding: Secondary | ICD-10-CM | POA: Diagnosis not present

## 2017-04-18 DIAGNOSIS — N200 Calculus of kidney: Secondary | ICD-10-CM | POA: Insufficient documentation

## 2017-04-18 DIAGNOSIS — N281 Cyst of kidney, acquired: Secondary | ICD-10-CM | POA: Insufficient documentation

## 2017-04-18 DIAGNOSIS — R310 Gross hematuria: Secondary | ICD-10-CM

## 2017-04-18 DIAGNOSIS — I700 Atherosclerosis of aorta: Secondary | ICD-10-CM | POA: Insufficient documentation

## 2017-04-18 DIAGNOSIS — N210 Calculus in bladder: Secondary | ICD-10-CM | POA: Insufficient documentation

## 2017-04-18 DIAGNOSIS — I2510 Atherosclerotic heart disease of native coronary artery without angina pectoris: Secondary | ICD-10-CM | POA: Insufficient documentation

## 2017-04-30 ENCOUNTER — Telehealth: Payer: Self-pay | Admitting: Neurology

## 2017-04-30 NOTE — Telephone Encounter (Signed)
Agree 

## 2017-04-30 NOTE — Telephone Encounter (Signed)
Patient's friend called up stating that he showed up at patient's house and he was short of breath and unable to swallow medication. He was finally able to take medications and shortness of breath is better.   I advised with his history of heart problems and him having shortness of breath he needs to be seen in the ER now.   [facility].

## 2017-05-03 ENCOUNTER — Telehealth: Payer: Self-pay | Admitting: Neurology

## 2017-05-03 NOTE — Telephone Encounter (Signed)
Received note from Linus Salmonshapman McQueen, M.D. that the patient was seen on 05/01/2017 for dysphagia.  It was recommended that he follow through with his modified barium swallow and may need to consider G-tube, depending on MBE recommendations.  It was recommended that he follow up with Dr. Malvin JohnsPotter regarding his sleep apnea.  Lesly RubensteinJade, looks like [facility] but wonder if not seen because don't see a note??  Does patient have an MBE scheduled?

## 2017-05-03 NOTE — Telephone Encounter (Signed)
Left message on machine for patient to call back To discuss.   It does look like an MBE was ordered in the past, but documented that patient has an allergic reaction to barium and just had a swallow study. This was in late 2017.

## 2017-05-07 ENCOUNTER — Encounter: Payer: Self-pay | Admitting: Occupational Therapy

## 2017-05-07 ENCOUNTER — Ambulatory Visit: Payer: BLUE CROSS/BLUE SHIELD | Attending: Neurology | Admitting: Occupational Therapy

## 2017-05-07 VITALS — BP 165/90

## 2017-05-07 DIAGNOSIS — R262 Difficulty in walking, not elsewhere classified: Secondary | ICD-10-CM | POA: Insufficient documentation

## 2017-05-07 DIAGNOSIS — R279 Unspecified lack of coordination: Secondary | ICD-10-CM | POA: Diagnosis present

## 2017-05-07 DIAGNOSIS — G20 Parkinson's disease: Secondary | ICD-10-CM | POA: Diagnosis present

## 2017-05-07 DIAGNOSIS — R2681 Unsteadiness on feet: Secondary | ICD-10-CM | POA: Diagnosis present

## 2017-05-07 DIAGNOSIS — M6281 Muscle weakness (generalized): Secondary | ICD-10-CM | POA: Diagnosis present
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
## 2017-05-13 ENCOUNTER — Telehealth: Payer: Self-pay | Admitting: Neurology

## 2017-05-13 ENCOUNTER — Other Ambulatory Visit: Payer: BLUE CROSS/BLUE SHIELD

## 2017-05-13 ENCOUNTER — Other Ambulatory Visit (HOSPITAL_COMMUNITY): Payer: Self-pay | Admitting: Neurology

## 2017-05-13 ENCOUNTER — Ambulatory Visit (INDEPENDENT_AMBULATORY_CARE_PROVIDER_SITE_OTHER): Payer: BLUE CROSS/BLUE SHIELD | Admitting: Neurology

## 2017-05-13 ENCOUNTER — Encounter: Payer: Self-pay | Admitting: Neurology

## 2017-05-13 VITALS — BP 144/80 | HR 60 | Ht 70.0 in | Wt 219.0 lb

## 2017-05-13 DIAGNOSIS — R739 Hyperglycemia, unspecified: Secondary | ICD-10-CM

## 2017-05-13 DIAGNOSIS — G20A1 Parkinson's disease without dyskinesia, without mention of fluctuations: Secondary | ICD-10-CM

## 2017-05-13 DIAGNOSIS — G20 Parkinson's disease: Secondary | ICD-10-CM

## 2017-05-13 DIAGNOSIS — R1319 Other dysphagia: Secondary | ICD-10-CM

## 2017-05-13 DIAGNOSIS — K117 Disturbances of salivary secretion: Secondary | ICD-10-CM

## 2017-05-13 DIAGNOSIS — R1310 Dysphagia, unspecified: Secondary | ICD-10-CM

## 2017-05-13 LAB — COMPREHENSIVE METABOLIC PANEL
ALK PHOS: 91 U/L (ref 40–115)
ALT: 5 U/L — AB (ref 9–46)
AST: 12 U/L (ref 10–35)
Albumin: 4 g/dL (ref 3.6–5.1)
BUN: 17 mg/dL (ref 7–25)
CALCIUM: 9.3 mg/dL (ref 8.6–10.3)
CO2: 26 mmol/L (ref 20–31)
Chloride: 104 mmol/L (ref 98–110)
Creat: 1.08 mg/dL (ref 0.70–1.25)
GLUCOSE: 146 mg/dL — AB (ref 65–99)
POTASSIUM: 4.4 mmol/L (ref 3.5–5.3)
Sodium: 139 mmol/L (ref 135–146)
Total Bilirubin: 0.7 mg/dL (ref 0.2–1.2)
Total Protein: 6.5 g/dL (ref 6.1–8.1)

## 2017-05-13 LAB — TSH: TSH: 0.82 m[IU]/L (ref 0.40–4.50)

## 2017-05-13 MED ORDER — ROTIGOTINE 2 MG/24HR TD PT24: 1.0000 | MEDICATED_PATCH | Freq: Every day | TRANSDERMAL | 0 refills | Status: DC

## 2017-05-13 MED ORDER — ROTIGOTINE 4 MG/24HR TD PT24: 1.0000 | MEDICATED_PATCH | Freq: Every day | TRANSDERMAL | 0 refills | Status: DC
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
## 2017-05-13 NOTE — Telephone Encounter (Signed)
I just moved his appt to July 16 and he is still on a wait list

## 2017-05-13 NOTE — Telephone Encounter (Signed)
Patient needs to talk to someone about medication not working. Speech is getting worst and hard to talk. I made patient appt but it is in oct due to that was her first available  appt and patient is on a wait list

## 2017-05-13 NOTE — Telephone Encounter (Signed)
Called and left message with [facility] to call me back. To inquire about therapies. It states patient is scheduled for voice therapy, but then appt says BIG and scheduled with OT. He should be in LSVT LOUD therapy. Awaiting call back to discuss.

## 2017-05-13 NOTE — Telephone Encounter (Signed)
Did we ever figure out what exactly is his allergy to barium?  When was his last speech therapy?

## 2017-05-13 NOTE — Telephone Encounter (Signed)
Patient coming in for an appt today

## 2017-05-13 NOTE — Patient Instructions (Addendum)
1. We [facility] for your modified barium swallow on 05/23/17 at 1:00 pm. Please arrive 15 minutes prior and go to 1st floor radiology. If you need to reschedule for any reason please call [phone].  2. Change Carbidopa Levodopa to the following. Carbidopa Levodopa 25/100 IR  3 tablets at 6 am, 2 tablets at 9:30 am, 3 tablets at 1 pm,  2 tablets at 4:30 pm, 2 tablets at 8 pm.  Carbidopa Levodopa 50/200 1 at bedtime (midnight)  3. Your provider has requested that you have labwork completed today. Please go to [facility] ([id number]) on the second floor of this building before leaving the office today. You do not need to check in. If you are not called within 15 minutes please check with the front desk.   4. Neupro given.  Start 2 mg daily for one week Increase to 4 mg daily after

## 2017-05-14 ENCOUNTER — Telehealth: Payer: Self-pay | Admitting: Neurology

## 2017-05-14 ENCOUNTER — Encounter: Payer: Self-pay | Admitting: Family Medicine

## 2017-05-14 ENCOUNTER — Ambulatory Visit (INDEPENDENT_AMBULATORY_CARE_PROVIDER_SITE_OTHER): Payer: BLUE CROSS/BLUE SHIELD | Admitting: Family Medicine

## 2017-05-14 VITALS — BP 100/60 | HR 60 | Ht 71.0 in | Wt 217.0 lb

## 2017-05-14 DIAGNOSIS — W57XXXA Bitten or stung by nonvenomous insect and other nonvenomous arthropods, initial encounter: Secondary | ICD-10-CM

## 2017-05-14 DIAGNOSIS — R5383 Other fatigue: Secondary | ICD-10-CM

## 2017-05-14 DIAGNOSIS — R739 Hyperglycemia, unspecified: Secondary | ICD-10-CM

## 2017-05-14 LAB — HEMOGLOBIN A1C
Hgb A1c MFr Bld: 6.8 % — ABNORMAL HIGH (ref <5.7)
Mean Plasma Glucose: 148 mg/dL

## 2017-05-14 LAB — VITAMIN B12: VITAMIN B 12: 447 pg/mL (ref 200–1100)

## 2017-05-14 MED ORDER — DOXYCYCLINE HYCLATE 100 MG PO TABS: 100.0000 mg | ORAL_TABLET | Freq: Two times a day (BID) | ORAL | 0 refills | Status: DC

## 2017-05-14 NOTE — Telephone Encounter (Signed)
Left another message with [facility].

## 2017-05-14 NOTE — Addendum Note (Signed)
Addended bySilvio Pate: MCCRACKEN, JADE L on: 05/14/2017 04:03 PM   Modules accepted: Orders

## 2017-05-14 NOTE — Telephone Encounter (Signed)
-----   Message from Octaviano Battyebecca S Tat, DO sent at 05/14/2017  7:26 AM EDT ----- Sean RubensteinJade, Let pt know that his number for DM is going up compared to last year and looks like may have developed DM over last year.  Would he like referral to endocrinology or like to see PCP, who also can easily manage this?  Please make sure that PCP gets a copy of labs.

## 2017-05-14 NOTE — Telephone Encounter (Signed)
Left message for Eunice BlaseDebbie, who called back and states that patient refused speech therapy. I let her know that patient asked about it yesterday, so if they can please reach back out to him to schedule LSVT LOUD therapy and call us with any questions.

## 2017-05-14 NOTE — Telephone Encounter (Signed)
Patient made aware. Lab results sent to PCP. He is actually in their office now and will discuss.

## 2017-05-14 NOTE — Progress Notes (Signed)
Name: Sean Barajas   MRN: [id number]    DOB: 03/27/1956   Date:05/14/2017       Progress Note  Subjective  Chief Complaint  Chief Complaint  Patient presents with  . Fatigue    Dizzy and lightheaded- Changed dose of medication yesterday. Barium-Swallow scheduled for June 21. Getting tired all the time now.    . Tick Removal    Removed about week ago. Left big red welp and then went away. On Right upper back. Was attatched for a while.     Patient presents for s/p removal of several ticks   Thyroid Problem  Presents for initial visit. Symptoms include anxiety. Patient reports no cold intolerance, constipation, depressed mood, diaphoresis, diarrhea, dry skin, fatigue, hair loss, heat intolerance, hoarse voice, leg swelling, nail problem, palpitations, tremors, visual change, weight gain or weight loss. The symptoms have been stable. The treatment provided mild relief.    No problem-specific Assessment & Plan notes found for this encounter.   Past Medical History:  Diagnosis Date  . Anxiety   . Coronary artery disease   . Hyperlipidemia   . Hypertension   . Parkinson's disease (HCC)   . Prediabetes     Past Surgical History:  Procedure Laterality Date  . APPENDECTOMY    . CARDIAC CATHETERIZATION    . CAROTID STENT  07/2015  . CHOLECYSTECTOMY    . CORONARY ANGIOPLASTY    . CORONARY ARTERY BYPASS GRAFT    . CORONARY STENT INTERVENTION N/A 01/30/2017   Procedure: Coronary Stent Intervention;  Surgeon: Marcina MillardAlexander Paraschos, MD;  Location: [facility] INVASIVE CV LAB;  Service: Cardiovascular;  Laterality: N/A;  . EYE SURGERY Bilateral   . LEFT HEART CATH AND CORS/GRAFTS ANGIOGRAPHY N/A 01/30/2017   Procedure: Left Heart Cath and Cors/Grafts Angiography and possible PCI;  Surgeon: Marcina MillardAlexander Paraschos, MD;  Location: [facility] INVASIVE CV LAB;  Service: Cardiovascular;  Laterality: N/A;  . NASAL SEPTUM SURGERY    . TONSILLECTOMY      History reviewed. No pertinent family  history.  Social History   Social History  . Marital status: Single    Spouse name: N/A  . Number of children: N/A  . Years of education: N/A   Occupational History  . self employed     toe truck driver   Social History Main Topics  . Smoking status: Never Smoker  . Smokeless tobacco: Never Used  . Alcohol use 0.0 oz/week     Comment: 1 glass wine/month  . Drug use: Yes     Comment: hemp oil  . Sexual activity: Not on file   Other Topics Concern  . Not on file   Social History Narrative  . No narrative on file    Allergies  Allergen Reactions  . Iodinated Diagnostic Agents Nausea Only and Other (See Comments)    Reaction:  Fever   . Sulfa Antibiotics Rash    Outpatient Medications Prior to Visit  Medication Sig Dispense Refill  . acetaminophen (TYLENOL) 500 MG tablet Take 1,000 mg by mouth every 6 (six) hours as needed for mild pain.    Marland Kitchen. albuterol (PROVENTIL HFA;VENTOLIN HFA) 108 (90 BASE) MCG/ACT inhaler Inhale 2 puffs into the lungs every 6 (six) hours as needed for wheezing or shortness of breath.    . ALPRAZolam (XANAX) 0.5 MG tablet Take 0.5 mg by mouth 3 (three) times daily. Dr Malvin JohnsPotter    . ARTIFICIAL TEAR OP Apply 1 drop to eye as needed (for dry eyes).    .Marland Kitchen  aspirin EC 81 MG tablet Take 81 mg by mouth daily.    . carbidopa-levodopa (SINEMET CR) 50-200 MG tablet Take 1 tablet by mouth at bedtime. 90 tablet 1  . carbidopa-levodopa (SINEMET IR) 25-100 MG tablet Take 2 tablets by mouth 6 (six) times daily. (Patient taking differently: Take 3 tablets by mouth 4 (four) times daily. ) 1080 tablet 1  . carvedilol (COREG) 6.25 MG tablet Take 6.25 mg by mouth 2 (two) times daily.     . isosorbide mononitrate (IMDUR) 30 MG 24 hr tablet Take 1 tablet (30 mg total) by mouth daily. 30 tablet 0  . lisinopril (ZESTRIL) 2.5 MG tablet Take 1 tablet (2.5 mg total) by mouth daily. 30 tablet 0  . nitroGLYCERIN (NITROSTAT) 0.4 MG SL tablet Place 0.4 mg under the tongue every 5  (five) minutes as needed for chest pain.    . phenol (CHLORASEPTIC) 1.4 % LIQD Use as directed 1 spray in the mouth or throat as needed for throat irritation / pain.    . rosuvastatin (CRESTOR) 20 MG tablet Take 20 mg by mouth at bedtime.    . rotigotine (NEUPRO) 2 MG/24HR Place 1 patch onto the skin daily. 7 patch 0  . ticagrelor (BRILINTA) 90 MG TABS tablet Take 90 mg by mouth every 12 (twelve) hours.    . valACYclovir (VALTREX) 500 MG tablet Take 500 mg by mouth 2 (two) times daily.    . rotigotine (NEUPRO) 4 MG/24HR Place 1 patch onto the skin daily. (Patient not taking: Reported on 05/13/2017) 90 patch 1  . rotigotine (NEUPRO) 4 MG/24HR Place 1 patch onto the skin daily. (Patient not taking: Reported on 05/14/2017) 7 patch 0  . sildenafil (VIAGRA) 100 MG tablet Take 100 mg by mouth daily as needed for erectile dysfunction.     No facility-administered medications prior to visit.     Review of Systems  Constitutional: Negative for chills, diaphoresis, fatigue, fever, malaise/fatigue, weight gain and weight loss.  HENT: Negative for ear discharge, ear pain, hoarse voice and sore throat.   Eyes: Negative for blurred vision.  Respiratory: Negative for cough, sputum production, shortness of breath and wheezing.   Cardiovascular: Negative for chest pain, palpitations and leg swelling.  Gastrointestinal: Negative for abdominal pain, blood in stool, constipation, diarrhea, heartburn, melena and nausea.  Genitourinary: Negative for dysuria, frequency, hematuria and urgency.  Musculoskeletal: Negative for back pain, joint pain, myalgias and neck pain.  Skin: Negative for rash.  Neurological: Negative for dizziness, tingling, tremors, sensory change, focal weakness and headaches.  Endo/Heme/Allergies: Negative for environmental allergies, cold intolerance, heat intolerance and polydipsia. Does not bruise/bleed easily.  Psychiatric/Behavioral: Negative for depression and suicidal ideas. The patient  is nervous/anxious. The patient does not have insomnia.      Objective  Vitals:   05/14/17 1148  BP: 100/60  Pulse: 60  Weight: 217 lb (98.4 kg)  Height: 5\' 11"  (1.803 m)    Physical Exam  Constitutional: He is oriented to person, place, and time and well-developed, well-nourished, and in no distress.  HENT:  Head: Normocephalic.  Right Ear: External ear normal.  Left Ear: External ear normal.  Nose: Nose normal.  Mouth/Throat: Oropharynx is clear and moist.  Eyes: Conjunctivae and EOM are normal. Pupils are equal, round, and reactive to light. Right eye exhibits no discharge. Left eye exhibits no discharge. No scleral icterus.  Neck: Normal range of motion. Neck supple. No JVD present. No tracheal deviation present. No thyromegaly present.  Cardiovascular: Normal rate,  regular rhythm, normal heart sounds and intact distal pulses.  Exam reveals no gallop and no friction rub.   No murmur heard. Pulmonary/Chest: Breath sounds normal. No respiratory distress. He has no wheezes. He has no rales.  Abdominal: Soft. Bowel sounds are normal. He exhibits no mass. There is no hepatosplenomegaly. There is no tenderness. There is no rebound, no guarding and no CVA tenderness.  Musculoskeletal: Normal range of motion. He exhibits no edema or tenderness.  Lymphadenopathy:    He has no cervical adenopathy.  Neurological: He is alert and oriented to person, place, and time. He has normal sensation, normal strength, normal reflexes and intact cranial nerves. No cranial nerve deficit.  Skin: Skin is warm. No rash noted.  Psychiatric: Mood and affect normal.  Nursing note and vitals reviewed.     Assessment & Plan  Problem List Items Addressed This Visit    None    Visit Diagnoses    Tick bite, initial encounter    -  Primary   Relevant Medications   doxycycline (VIBRA-TABS) 100 MG tablet   Fatigue, unspecified type          Meds ordered this encounter  Medications  . doxycycline  (VIBRA-TABS) 100 MG tablet    Sig: Take 1 tablet (100 mg total) by mouth 2 (two) times daily.    Dispense:  20 tablet    Refill:  0      Dr. Elizabeth Sauer [facility]  05/14/17

## 2017-05-14 NOTE — Telephone Encounter (Signed)
Patient called back and stated his PCP reviewed the labs but wasn't concerned. He would like the referral to endocrinology. Referral placed. They will call him to schedule.

## 2017-05-20 ENCOUNTER — Ambulatory Visit: Payer: BLUE CROSS/BLUE SHIELD | Admitting: Occupational Therapy

## 2017-05-20 DIAGNOSIS — G20 Parkinson's disease: Secondary | ICD-10-CM

## 2017-05-20 DIAGNOSIS — R2681 Unsteadiness on feet: Secondary | ICD-10-CM

## 2017-05-20 DIAGNOSIS — G20A1 Parkinson's disease without dyskinesia, without mention of fluctuations: Secondary | ICD-10-CM

## 2017-05-20 DIAGNOSIS — R262 Difficulty in walking, not elsewhere classified: Secondary | ICD-10-CM

## 2017-05-20 DIAGNOSIS — R279 Unspecified lack of coordination: Secondary | ICD-10-CM

## 2017-05-20 DIAGNOSIS — M6281 Muscle weakness (generalized): Secondary | ICD-10-CM

## 2017-05-21 ENCOUNTER — Encounter: Payer: Self-pay | Admitting: Occupational Therapy

## 2017-05-21 ENCOUNTER — Ambulatory Visit: Payer: BLUE CROSS/BLUE SHIELD | Admitting: Occupational Therapy

## 2017-05-21 DIAGNOSIS — G20 Parkinson's disease: Secondary | ICD-10-CM | POA: Diagnosis not present

## 2017-05-21 DIAGNOSIS — R279 Unspecified lack of coordination: Secondary | ICD-10-CM

## 2017-05-21 DIAGNOSIS — R262 Difficulty in walking, not elsewhere classified: Secondary | ICD-10-CM

## 2017-05-21 DIAGNOSIS — M6281 Muscle weakness (generalized): Secondary | ICD-10-CM

## 2017-05-21 DIAGNOSIS — R2681 Unsteadiness on feet: Secondary | ICD-10-CM

## 2017-05-22 ENCOUNTER — Encounter: Payer: Self-pay | Admitting: Occupational Therapy

## 2017-05-22 ENCOUNTER — Ambulatory Visit: Payer: BLUE CROSS/BLUE SHIELD | Admitting: Occupational Therapy

## 2017-05-22 DIAGNOSIS — M6281 Muscle weakness (generalized): Secondary | ICD-10-CM

## 2017-05-22 DIAGNOSIS — R262 Difficulty in walking, not elsewhere classified: Secondary | ICD-10-CM

## 2017-05-22 DIAGNOSIS — R279 Unspecified lack of coordination: Secondary | ICD-10-CM

## 2017-05-22 DIAGNOSIS — G20 Parkinson's disease: Secondary | ICD-10-CM

## 2017-05-22 DIAGNOSIS — G20A1 Parkinson's disease without dyskinesia, without mention of fluctuations: Secondary | ICD-10-CM

## 2017-05-22 DIAGNOSIS — R2681 Unsteadiness on feet: Secondary | ICD-10-CM

## 2017-05-23 ENCOUNTER — Ambulatory Visit (HOSPITAL_COMMUNITY)
Admission: RE | Admit: 2017-05-23 | Discharge: 2017-05-23 | Disposition: A | Discharge status: Home / Self Care | Payer: BLUE CROSS/BLUE SHIELD | Source: Ambulatory Visit | Attending: Neurology | Admitting: Neurology

## 2017-05-23 ENCOUNTER — Encounter: Payer: Self-pay | Admitting: Occupational Therapy

## 2017-05-23 ENCOUNTER — Telehealth: Payer: Self-pay | Admitting: Neurology

## 2017-05-23 ENCOUNTER — Ambulatory Visit: Payer: BLUE CROSS/BLUE SHIELD | Admitting: Occupational Therapy

## 2017-05-23 DIAGNOSIS — R1319 Other dysphagia: Secondary | ICD-10-CM | POA: Insufficient documentation

## 2017-05-23 DIAGNOSIS — R1310 Dysphagia, unspecified: Secondary | ICD-10-CM

## 2017-05-23 NOTE — Telephone Encounter (Signed)
Patient stopped by the office to leave this information for you. He will be seeing Sula Sodaarl Shinkle for Speech Therapy. [facility]. He also said they are sending him to a Gastro Specialist due to maybe stretching Esophagus stretching? Thanks

## 2017-05-23 NOTE — Therapy (Signed)
[place] [facility] [place] MAIN [facility] SERVICES [address] [place], [place], [address] Phone: [phone]   Fax:  [phone]  Occupational Therapy Treatment  Patient Details  Name: Sean Barajas MRN: [id number] Date of Birth: February 04, 1956 Referring Provider: Tat  Encounter Date: 05/21/2017      OT End of Session - 05/23/17 1648    Visit Number 3   Number of Visits 17   Date for OT Re-Evaluation 06/14/17   OT Start Time 1106   OT Stop Time 1204   OT Time Calculation (min) 58 min   Activity Tolerance Patient tolerated treatment well   Behavior During Therapy [facility] for tasks assessed/performed      Past Medical History:  Diagnosis Date  . Anxiety   . Coronary artery disease   . Hyperlipidemia   . Hypertension   . Parkinson's disease (HCC)   . Prediabetes     Past Surgical History:  Procedure Laterality Date  . APPENDECTOMY    . CARDIAC CATHETERIZATION    . CAROTID STENT  07/2015  . CHOLECYSTECTOMY    . CORONARY ANGIOPLASTY    . CORONARY ARTERY BYPASS GRAFT    . CORONARY STENT INTERVENTION N/A 01/30/2017   Procedure: Coronary Stent Intervention;  Surgeon: Marcina Millard, MD;  Location: [facility] INVASIVE CV LAB;  Service: Cardiovascular;  Laterality: N/A;  . EYE SURGERY Bilateral   . LEFT HEART CATH AND CORS/GRAFTS ANGIOGRAPHY N/A 01/30/2017   Procedure: Left Heart Cath and Cors/Grafts Angiography and possible PCI;  Surgeon: Marcina Millard, MD;  Location: [facility] INVASIVE CV LAB;  Service: Cardiovascular;  Laterality: N/A;  . NASAL SEPTUM SURGERY    . TONSILLECTOMY      There were no vitals filed for this visit.      Subjective Assessment - 05/23/17 1643    Subjective  Patient reports he has a swallow study scheduled for this week, when he did it in the past he had some aspiration with liquids.   Pertinent History Patient was diagnosed with Parkinson's disease in 2014 and was seen by therapy 1.5 years ago and did well with the LSSVT BIG  program.  Since that time he has had multiple health issues including a recent heart attack.  He has been falling the last 6 months on average 3 times a week.  He was referred by his physician for intensive LSVT BIG program to address balance, falls and functional mobility skills.     Patient Stated Goals Patient reports he wants to walk without stumbling, better balance.     Currently in Pain? Yes   Pain Score 5    Pain Location Back   Pain Orientation Right   Pain Descriptors / Indicators Aching   Pain Type Acute pain   Pain Onset 1 to 4 weeks ago   Pain Frequency Intermittent                      OT Treatments/Exercises (OP) - 05/23/17 1644      Neurological Re-education Exercises   Other Exercises 1 Patient seen for instruction of LSVT BIG exercises: LSVT Daily Session Maximal Daily Exercises: Sustained movements are designed to rescale the amplitude of movement output for generalization to daily functional activities. Performed as follows for 1 set of 10 repetitions each: Multi directional sustained movements- 1) Floor to ceiling, 2) Side to side. Multi directional Repetitive movements performed in standing and are designed to provide retraining effort needed for sustained muscle activation in tasks Performed  as follows: 3) Step and reach forward, 4) Step and Reach Backwards, 5) Step and reach sideways, 6) Rock and reach forward/backward, 7) Rock and reach sideways. Patient required CGA assist for all exercises in standing, moderate verbal and tactile cues provided.  Functional mobility in hallways for 2 trials of 425 feet with cues for amplitude of gait, reciprocal arm swing and posture to not lean too far forwards with initiation of gait.  Patient performing bed mobility on mat with min cues for technique on firm surface, has more difficulty at home on softer mattress. Patient seen for transfers from the floor with and without a surface to push up on.  CGA and cues for proper  form and technique as well as technique to use when he has no furniture or items available to hold on to or push up from.                  OT Education - 05/23/17 1647    Education provided Yes   Education Details daily exercises, floor transfers, bed mobility   Person(s) Educated Patient   Methods Explanation;Demonstration;Verbal cues   Comprehension Verbal cues required;Returned demonstration;Verbalized understanding             OT Long Term Goals - 05/09/17 2035      OT LONG TERM GOAL #1   Title Patient will improve gait speed and endurance and be able to walk 1650 feet in 6 minutes to negotiate around the home and community safely in 4 weeks.   Baseline 1480 feet at evaluation   Time 4   Period Weeks   Status New     OT LONG TERM GOAL #2   Title Patient will complete HEP for maximal daily exercises with modified independence in 4 weeks   Baseline has not been performing any exercise program since last episode of therapy   Time 4   Period Weeks   Status New     OT LONG TERM GOAL #3   Title Patient will demonstrate strategies for initiation of gait as well as management of gait speed to reduce risk of falls.    Baseline patient tends to either freeze with gait or move impulsively with a forward leaning pattern and decrease control of gait.    Time 4   Period Weeks   Status New     OT LONG TERM GOAL #4   Title Patient will decrease frequency of freezing episodes with score of 8 or less on Freezing of Gait Questionnaire    Baseline score of 9 at evaluation.    Time 4   Period Weeks   Status New               Plan - 05/23/17 1649    Clinical Impression Statement Patient limited with pain in his back that he reports is from his kidney after a fall a few weeks ago.  He has difficulty getting up from the bed and often uses momentum to get up and then falls since he intiates getting up too fast.  Instructed on techniques and to sit on the edge of bed for  5-10 secs prior to standing to get out of bed.  Also seen for floor transfers in case of falls since he lives alone and has a difficult time getting up.  He does best if he has something to hold onto however this has also caused him to fall by pulling up on unstable surfaces.  Continue to work towards  goals to improve functional transfers, mobility and participation in daily tasks.    Rehab Potential Good   Current Impairments/barriers affecting progress: co morbidities, recurrent falls   OT Frequency 4x / week   OT Duration 4 weeks   OT Treatment/Interventions Self-care/ADL training;Therapeutic exercise;Functional Mobility Training;Patient/family education;Neuromuscular education;DME and/or AE instruction;Gait Training;Stair Training   Consulted and Agree with Plan of Care Patient      Patient will benefit from skilled therapeutic intervention in order to improve the following deficits and impairments:  Abnormal gait, Decreased knowledge of use of DME, Decreased strength, Decreased balance, Decreased mobility, Difficulty walking, Decreased coordination, Decreased endurance, Pain, Impaired perceived functional ability  Visit Diagnosis: Difficulty walking  Parkinson's disease (HCC)  Muscle weakness (generalized)  Lack of coordination  Unsteadiness on feet  Unsteady gait    Problem List Patient Active Problem List   Diagnosis Date Noted  . CAD (coronary artery disease) 01/30/2017  . Unstable angina (HCC) 01/29/2017  . Aortic atherosclerosis (HCC) 08/28/2016  . Prediabetes 06/25/2016   Kameah Rawl T Arne Cleveland, OTR/L, CLT  Geanna Divirgilio 05/23/2017, 4:53 PM  [place] [facility] MAIN [facility] SERVICES [address] [place], [place], [address] Phone: [phone]   Fax:  [phone]  Name: Sean Barajas MRN: [id number] Date of Birth: 07/23/1956

## 2017-05-23 NOTE — Therapy (Signed)
[place] [facility] MAIN [facility] [address] [place], [place], [address] Phone: [phone]   Fax:  [phone]  Occupational Therapy Treatment  Patient Details  Name: Sean Barajas MRN: [id number] Date of Birth: 06/12/1956 Referring Provider: Tat  Encounter Date: 05/22/2017      OT End of Session - 05/23/17 1658    Visit Number 4   Number of Visits 17   Date for OT Re-Evaluation 06/14/17   OT Start Time 1101   OT Stop Time 1200   OT Time Calculation (min) 59 min   Activity Tolerance Patient tolerated treatment well   Behavior During Therapy [facility] for tasks assessed/performed      Past Medical History:  Diagnosis Date  . Anxiety   . Coronary artery disease   . Hyperlipidemia   . Hypertension   . Parkinson's disease (HCC)   . Prediabetes     Past Surgical History:  Procedure Laterality Date  . APPENDECTOMY    . CARDIAC CATHETERIZATION    . CAROTID STENT  07/2015  . CHOLECYSTECTOMY    . CORONARY ANGIOPLASTY    . CORONARY ARTERY BYPASS GRAFT    . CORONARY STENT INTERVENTION N/A 01/30/2017   Procedure: Coronary Stent Intervention;  Surgeon: Marcina MillardAlexander Paraschos, MD;  Location: [facility] INVASIVE CV LAB;  Service: Cardiovascular;  Laterality: N/A;  . EYE SURGERY Bilateral   . LEFT HEART CATH AND CORS/GRAFTS ANGIOGRAPHY N/A 01/30/2017   Procedure: Left Heart Cath and Cors/Grafts Angiography and possible PCI;  Surgeon: Marcina MillardAlexander Paraschos, MD;  Location: [facility] INVASIVE CV LAB;  Service: Cardiovascular;  Laterality: N/A;  . NASAL SEPTUM SURGERY    . TONSILLECTOMY      There were no vitals filed for this visit.      Subjective Assessment - 05/23/17 1655    Subjective  Patient reports he has felt a little dizzy today and did not feel he could drive, had someone to drop him off and will call for a ride after he is done today.     Pertinent History Patient was diagnosed with Parkinson's disease in 2014 and was seen by therapy 1.5 years ago  and did well with the LSSVT BIG program.  Since that time he has had multiple health issues including a recent heart attack.  He has been falling the last 6 months on average 3 times a week.  He was referred by his physician for intensive LSVT BIG program to address balance, falls and functional mobility skills.     Patient Stated Goals Patient reports he wants to walk without stumbling, better balance.     Currently in Pain? Yes   Pain Score 5    Pain Location Back   Pain Orientation Right   Pain Descriptors / Indicators Aching   Pain Type Acute pain   Pain Onset 1 to 4 weeks ago   Multiple Pain Sites No                      OT Treatments/Exercises (OP) - 05/23/17 1656      Neurological Re-education Exercises   Other Exercises 1 Patient seen for instruction of LSVT BIG exercises: LSVT Daily Session Maximal Daily Exercises: Sustained movements are designed to rescale the amplitude of movement output for generalization to daily functional activities. Performed as follows for 1 set of 10 repetitions each: Multi directional sustained movements- 1) Floor to ceiling, 2) Side to side. Multi directional Repetitive movements performed in standing and are designed  to provide retraining effort needed for sustained muscle activation in tasks Performed as follows: 3) Step and reach forward, 4) Step and Reach Backwards, 5) Step and reach sideways, 6) Rock and reach forward/backward, 7) Rock and reach sideways. Patient required CGA assist for all exercises in standing, moderate verbal and tactile cues provided.  Increased rest breaks required this date due to not feeling well.  BP taken 2x 118/79, 124/78, HR 52                OT Education - 05/23/17 1657    Education provided Yes   Education Details maximal daily exercises, discussed choosing functional component tasks.   Person(s) Educated Patient   Methods Explanation;Demonstration;Verbal cues   Comprehension Verbal cues  required;Returned demonstration;Verbalized understanding             OT Long Term Goals - 05/09/17 2035      OT LONG TERM GOAL #1   Title Patient will improve gait speed and endurance and be able to walk 1650 feet in 6 minutes to negotiate around the home and community safely in 4 weeks.   Baseline 1480 feet at evaluation   Time 4   Period Weeks   Status New     OT LONG TERM GOAL #2   Title Patient will complete HEP for maximal daily exercises with modified independence in 4 weeks   Baseline has not been performing any exercise program since last episode of therapy   Time 4   Period Weeks   Status New     OT LONG TERM GOAL #3   Title Patient will demonstrate strategies for initiation of gait as well as management of gait speed to reduce risk of falls.    Baseline patient tends to either freeze with gait or move impulsively with a forward leaning pattern and decrease control of gait.    Time 4   Period Weeks   Status New     OT LONG TERM GOAL #4   Title Patient will decrease frequency of freezing episodes with score of 8 or less on Freezing of Gait Questionnaire    Baseline score of 9 at evaluation.    Time 4   Period Weeks   Status New               Plan - 05/23/17 1659    Clinical Impression Statement Patient did not feel well today and reports he was struggling to perform exercises.  Deferred functional mobility this date due to not feeling well, focused on performance with daily exercises and establishing functional component tasks. Patient has a swallow study tomorrow after therapy session to reassess his swallowing difficulties.    Rehab Potential Good   Current Impairments/barriers affecting progress: co morbidities, recurrent falls   OT Frequency 4x / week   OT Duration 4 weeks   OT Treatment/Interventions Self-care/ADL training;Therapeutic exercise;Functional Mobility Training;Patient/family education;Neuromuscular education;DME and/or AE instruction;Gait  Training;Stair Training   Consulted and Agree with Plan of Care Patient      Patient will benefit from skilled therapeutic intervention in order to improve the following deficits and impairments:  Abnormal gait, Decreased knowledge of use of DME, Decreased strength, Decreased balance, Decreased mobility, Difficulty walking, Decreased coordination, Decreased endurance, Pain, Impaired perceived functional ability  Visit Diagnosis: Difficulty walking  Parkinson's disease (HCC)  Muscle weakness (generalized)  Lack of coordination  Unsteadiness on feet  Unsteady gait    Problem List Patient Active Problem List   Diagnosis Date Noted  .  CAD (coronary artery disease) 01/30/2017  . Unstable angina (HCC) 01/29/2017  . Aortic atherosclerosis (HCC) 08/28/2016  . Prediabetes 06/25/2016   Sean Barajas, OTR/L, CLT  Anikah Hogge 05/23/2017, 5:01 PM  [place] [facility] MAIN [facility] SERVICES [address] [place], [place], [address] Phone: [phone]   Fax:  [phone]  Name: CHAEL URENDA MRN: [id number] Date of Birth: 10/04/1956

## 2017-05-23 NOTE — Therapy (Signed)
[facility] MAIN [facility] SERVICES [address] [place], [place], [address] Phone: [phone]   Fax:  [phone]  Occupational Therapy Treatment  Patient Details  Name: Sean Barajas MRN: [id number] Date of Birth: 06-04-1956 Referring Provider: Tat  Encounter Date: 05/20/2017      OT End of Session - 05/23/17 1636    Visit Number 2   Number of Visits 17   Date for OT Re-Evaluation 06/14/17   OT Start Time 1113   OT Stop Time 1215   OT Time Calculation (min) 62 min   Activity Tolerance Patient tolerated treatment well   Behavior During Therapy [facility] for tasks assessed/performed      Past Medical History:  Diagnosis Date  . Anxiety   . Coronary artery disease   . Hyperlipidemia   . Hypertension   . Parkinson's disease (HCC)   . Prediabetes     Past Surgical History:  Procedure Laterality Date  . APPENDECTOMY    . CARDIAC CATHETERIZATION    . CAROTID STENT  07/2015  . CHOLECYSTECTOMY    . CORONARY ANGIOPLASTY    . CORONARY ARTERY BYPASS GRAFT    . CORONARY STENT INTERVENTION N/A 01/30/2017   Procedure: Coronary Stent Intervention;  Surgeon: Marcina Millard, MD;  Location: [facility] INVASIVE CV LAB;  Service: Cardiovascular;  Laterality: N/A;  . EYE SURGERY Bilateral   . LEFT HEART CATH AND CORS/GRAFTS ANGIOGRAPHY N/A 01/30/2017   Procedure: Left Heart Cath and Cors/Grafts Angiography and possible PCI;  Surgeon: Marcina Millard, MD;  Location: [facility] INVASIVE CV LAB;  Service: Cardiovascular;  Laterality: N/A;  . NASAL SEPTUM SURGERY    . TONSILLECTOMY      There were no vitals filed for this visit.      Subjective Assessment - 05/22/17 1649    Subjective  Patient reports he is not feeling well today, almost did not come to therapy, had his secretary drive him.    Pertinent History Patient was diagnosed with Parkinson's disease in 2014 and was seen by therapy 1.5 years ago and did well with the LSSVT BIG program.  Since that  time he has had multiple health issues including a recent heart attack.  He has been falling the last 6 months on average 3 times a week.  He was referred by his physician for intensive LSVT BIG program to address balance, falls and functional mobility skills.     Patient Stated Goals Patient reports he wants to walk without stumbling, better balance.     Currently in Pain? Yes   Pain Score 5    Pain Location Back   Pain Orientation Right   Pain Descriptors / Indicators Aching   Pain Type Acute pain   Pain Onset 1 to 4 weeks ago   Pain Frequency Intermittent                      OT Treatments/Exercises (OP) - 05/23/17 1635      Neurological Re-education Exercises   Other Exercises 1 Patient seen for instruction of LSVT BIG exercises: LSVT Daily Session Maximal Daily Exercises: Sustained movements are designed to rescale the amplitude of movement output for generalization to daily functional activities. Performed as follows for 1 set of 10 repetitions each: Multi directional sustained movements- 1) Floor to ceiling, 2) Side to side. Multi directional Repetitive movements performed in standing and are designed to provide retraining effort needed for sustained muscle activation in tasks Performed as follows: 3) Step  and reach forward, 4) Step and Reach Backwards, 5) Step and reach sideways, 6) Rock and reach forward/backward, 7) Rock and reach sideways. Patient required CGA assist for all exercises in standing, moderate verbal and tactile cues provided.  Functional mobility in hallways for 2 trials of 400 feet with cues for amplitude of gait, reciprocal arm swing and posture to not lean too far forwards with initiation of gait.                  OT Education - 05/23/17 1635    Education provided Yes   Education Details LSVT BIG maximal daily exercises   Person(s) Educated Patient   Methods Explanation;Demonstration;Verbal cues   Comprehension Verbal cues required;Returned  demonstration;Verbalized understanding             OT Long Term Goals - 05/09/17 2035      OT LONG TERM GOAL #1   Title Patient will improve gait speed and endurance and be able to walk 1650 feet in 6 minutes to negotiate around the home and community safely in 4 weeks.   Baseline 1480 feet at evaluation   Time 4   Period Weeks   Status New     OT LONG TERM GOAL #2   Title Patient will complete HEP for maximal daily exercises with modified independence in 4 weeks   Baseline has not been performing any exercise program since last episode of therapy   Time 4   Period Weeks   Status New     OT LONG TERM GOAL #3   Title Patient will demonstrate strategies for initiation of gait as well as management of gait speed to reduce risk of falls.    Baseline patient tends to either freeze with gait or move impulsively with a forward leaning pattern and decrease control of gait.    Time 4   Period Weeks   Status New     OT LONG TERM GOAL #4   Title Patient will decrease frequency of freezing episodes with score of 8 or less on Freezing of Gait Questionnaire    Baseline score of 9 at evaluation.    Time 4   Period Weeks   Status New               Plan - 05/23/17 1636    Clinical Impression Statement Patient does not recall maximal daily exercises from the last episode of therapy and has not been performing them in the last year or so.  He reports they are somewhat familiar when performing them this date.  Patient responds well to verbal and tactile cues for positioning and form for exercises.  He has difficulty with coordination of arm and leg positions which require reciprocal motions.  Will continue to work towards goals to improve independence in daily tasks and reduce risk of falls.    Rehab Potential Good   Current Impairments/barriers affecting progress: co morbidities, recurrent falls   OT Frequency 4x / week   OT Duration 4 weeks   OT Treatment/Interventions  Self-care/ADL training;Therapeutic exercise;Functional Mobility Training;Patient/family education;Neuromuscular education;DME and/or AE instruction;Gait Training;Stair Training   Consulted and Agree with Plan of Care Patient      Patient will benefit from skilled therapeutic intervention in order to improve the following deficits and impairments:  Abnormal gait, Decreased knowledge of use of DME, Decreased strength, Decreased balance, Decreased mobility, Difficulty walking, Decreased coordination, Decreased endurance, Pain, Impaired perceived functional ability  Visit Diagnosis: Parkinson's disease (HCC)  Muscle weakness (  generalized)  Lack of coordination  Difficulty walking  Unsteadiness on feet  Unsteady gait    Problem List Patient Active Problem List   Diagnosis Date Noted  . CAD (coronary artery disease) 01/30/2017  . Unstable angina (HCC) 01/29/2017  . Aortic atherosclerosis (HCC) 08/28/2016  . Prediabetes 06/25/2016   Danney Bungert T Arne Cleveland, OTR/L, CLT  Yun Gutierrez 05/23/2017, 4:39 PM  [facility] MAIN [facility] SERVICES [address] [place], [place], [address] Phone: [phone]   Fax:  [phone]  Name: Sean Barajas MRN: [id number] Date of Birth: Aug 22, 1956

## 2017-05-23 NOTE — Telephone Encounter (Signed)
Dr. Tat - FYI. 

## 2017-05-27 ENCOUNTER — Encounter: Payer: Self-pay | Admitting: Occupational Therapy

## 2017-05-27 ENCOUNTER — Ambulatory Visit: Payer: BLUE CROSS/BLUE SHIELD | Admitting: Occupational Therapy

## 2017-05-27 DIAGNOSIS — G20A1 Parkinson's disease without dyskinesia, without mention of fluctuations: Secondary | ICD-10-CM

## 2017-05-27 DIAGNOSIS — R2681 Unsteadiness on feet: Secondary | ICD-10-CM

## 2017-05-27 DIAGNOSIS — M6281 Muscle weakness (generalized): Secondary | ICD-10-CM

## 2017-05-27 DIAGNOSIS — G20 Parkinson's disease: Secondary | ICD-10-CM

## 2017-05-27 DIAGNOSIS — R262 Difficulty in walking, not elsewhere classified: Secondary | ICD-10-CM

## 2017-05-27 DIAGNOSIS — R279 Unspecified lack of coordination: Secondary | ICD-10-CM

## 2017-05-27 NOTE — Therapy (Signed)
[facility] MAIN [facility] SERVICES [address] [place], [place], [address] Phone: [phone]   Fax:  [phone]  Occupational Therapy Treatment  Patient Details  Name: Sean Barajas MRN: [id number] Date of Birth: 06-30-1956 Referring Provider: Tat  Encounter Date: 05/27/2017      OT End of Session - 05/27/17 1537    Visit Number 5   Number of Visits 17   Date for OT Re-Evaluation 06/14/17   OT Start Time 1100   OT Stop Time 1158   OT Time Calculation (min) 58 min   Activity Tolerance Patient tolerated treatment well   Behavior During Therapy [facility] for tasks assessed/performed      Past Medical History:  Diagnosis Date  . Anxiety   . Coronary artery disease   . Hyperlipidemia   . Hypertension   . Parkinson's disease (HCC)   . Prediabetes     Past Surgical History:  Procedure Laterality Date  . APPENDECTOMY    . CARDIAC CATHETERIZATION    . CAROTID STENT  07/2015  . CHOLECYSTECTOMY    . CORONARY ANGIOPLASTY    . CORONARY ARTERY BYPASS GRAFT    . CORONARY STENT INTERVENTION N/A 01/30/2017   Procedure: Coronary Stent Intervention;  Surgeon: Marcina Millard, MD;  Location: [facility] INVASIVE CV LAB;  Service: Cardiovascular;  Laterality: N/A;  . EYE SURGERY Bilateral   . LEFT HEART CATH AND CORS/GRAFTS ANGIOGRAPHY N/A 01/30/2017   Procedure: Left Heart Cath and Cors/Grafts Angiography and possible PCI;  Surgeon: Marcina Millard, MD;  Location: [facility] INVASIVE CV LAB;  Service: Cardiovascular;  Laterality: N/A;  . NASAL SEPTUM SURGERY    . TONSILLECTOMY      There were no vitals filed for this visit.      Subjective Assessment - 05/27/17 1530    Subjective  Patient reports he did not get a lot of rest over the weekend, hard time relaxing, tried to do his exercises but felt really tired and weak.   Pertinent History Patient was diagnosed with Parkinson's disease in 2014 and was seen by therapy 1.5 years ago and did well with  the LSSVT BIG program.  Since that time he has had multiple health issues including a recent heart attack.  He has been falling the last 6 months on average 3 times a week.  He was referred by his physician for intensive LSVT BIG program to address balance, falls and functional mobility skills.     Patient Stated Goals Patient reports he wants to walk without stumbling, better balance.     Currently in Pain? Yes   Pain Score 2    Pain Location Back   Pain Orientation Right   Pain Descriptors / Indicators Aching   Pain Type Acute pain   Pain Onset 1 to 4 weeks ago   Pain Frequency Intermittent   Multiple Pain Sites No                      OT Treatments/Exercises (OP) - 05/27/17 1531      Neurological Re-education Exercises   Other Exercises 1 Patient seen for instruction of LSVT BIG exercises: LSVT Daily Session Maximal Daily Exercises: Sustained movements are designed to rescale the amplitude of movement output for generalization to daily functional activities. Performed as follows for 1 set of 10 repetitions each: Multi directional sustained movements- 1) Floor to ceiling, 2) Side to side. Multi directional Repetitive movements performed in standing and are designed to provide retraining  effort needed for sustained muscle activation in tasks Performed as follows: 3) Step and reach forward, 4) Step and Reach Backwards, 5) Step and reach sideways, 6) Rock and reach forward/backward, 7) Rock and reach sideways. Patient required CGA assist for all exercises in standing, verbal and tactile cues.  Patient required 2 rest breaks this date.  Reciprocal toe tapping in standing with SBA and cues to complete for technique.  Stair negotiation 5 steps for 5 trials with cues for turns at the stair landing.  Functional mobility outdoors this date with sloped and winding pathways with SBA for 2 trials of 375 feet.  Patient performing sit to stand from bench without difficulty and from moving swing  with CGA for safety.  Cues during functional mobility for posture, balance and body positioning.                  OT Education - 05/27/17 1536    Education provided No   Education Details daily exercises, posture and body position during functional mobility   Person(s) Educated Patient   Methods Explanation;Demonstration;Verbal cues   Comprehension Verbal cues required;Returned demonstration;Verbalized understanding             OT Long Term Goals - 05/09/17 2035      OT LONG TERM GOAL #1   Title Patient will improve gait speed and endurance and be able to walk 1650 feet in 6 minutes to negotiate around the home and community safely in 4 weeks.   Baseline 1480 feet at evaluation   Time 4   Period Weeks   Status New     OT LONG TERM GOAL #2   Title Patient will complete HEP for maximal daily exercises with modified independence in 4 weeks   Baseline has not been performing any exercise program since last episode of therapy   Time 4   Period Weeks   Status New     OT LONG TERM GOAL #3   Title Patient will demonstrate strategies for initiation of gait as well as management of gait speed to reduce risk of falls.    Baseline patient tends to either freeze with gait or move impulsively with a forward leaning pattern and decrease control of gait.    Time 4   Period Weeks   Status New     OT LONG TERM GOAL #4   Title Patient will decrease frequency of freezing episodes with score of 8 or less on Freezing of Gait Questionnaire    Baseline score of 9 at evaluation.    Time 4   Period Weeks   Status New               Plan - 05/27/17 1537    Clinical Impression Statement Patient reports he felt better this date than last week but still feels a bit weak. He tried exercises at home and did not feel he was successful in his performance.  Reinstructed patient this date on daily exercises with cues for form and technique as well as to increase amplitude of movements.   Patient with low voice quality and encouraged him to speak with louder voice and count outloud for repetitions.  Continue to work towards improving balance, functional mobility and reducing risk of falls.     Rehab Potential Good   Current Impairments/barriers affecting progress: co morbidities, recurrent falls   OT Frequency 4x / week   OT Duration 4 weeks   OT Treatment/Interventions Self-care/ADL training;Therapeutic exercise;Functional Mobility Training;Patient/family education;Neuromuscular education;DME  and/or AE instruction;Gait Veterinary surgeonTraining;Stair Training   Consulted and Agree with Plan of Care Patient      Patient will benefit from skilled therapeutic intervention in order to improve the following deficits and impairments:  Abnormal gait, Decreased knowledge of use of DME, Decreased strength, Decreased balance, Decreased mobility, Difficulty walking, Decreased coordination, Decreased endurance, Pain, Impaired perceived functional ability  Visit Diagnosis: Difficulty walking  Parkinson's disease (HCC)  Muscle weakness (generalized)  Lack of coordination  Unsteadiness on feet  Unsteady gait    Problem List Patient Active Problem List   Diagnosis Date Noted  . CAD (coronary artery disease) 01/30/2017  . Unstable angina (HCC) 01/29/2017  . Aortic atherosclerosis (HCC) 08/28/2016  . Prediabetes 06/25/2016   Kiyon Fidalgo T Arne ClevelandLovett, OTR/L, CLT  Hatice Bubel 05/27/2017, 3:41 PM  [facility] [address] [place], [place], [address] Phone: [phone]   Fax:  [phone]  Name: Sean Barajas MRN: [id number] Date of Birth: 10/31/1956

## 2017-05-28 ENCOUNTER — Ambulatory Visit: Payer: BLUE CROSS/BLUE SHIELD | Admitting: Occupational Therapy

## 2017-05-28 ENCOUNTER — Encounter: Payer: BLUE CROSS/BLUE SHIELD | Admitting: Occupational Therapy

## 2017-05-28 DIAGNOSIS — R2681 Unsteadiness on feet: Secondary | ICD-10-CM

## 2017-05-28 DIAGNOSIS — R279 Unspecified lack of coordination: Secondary | ICD-10-CM

## 2017-05-28 DIAGNOSIS — G20 Parkinson's disease: Secondary | ICD-10-CM | POA: Diagnosis not present

## 2017-05-28 DIAGNOSIS — R262 Difficulty in walking, not elsewhere classified: Secondary | ICD-10-CM

## 2017-05-28 DIAGNOSIS — M6281 Muscle weakness (generalized): Secondary | ICD-10-CM

## 2017-05-29 ENCOUNTER — Ambulatory Visit: Payer: BLUE CROSS/BLUE SHIELD | Admitting: Occupational Therapy

## 2017-05-29 ENCOUNTER — Encounter: Payer: Self-pay | Admitting: Occupational Therapy

## 2017-05-29 NOTE — Therapy (Signed)
[facility] [address] [place], [place], [address] Phone: [phone]   Fax:  [phone]  Occupational Therapy Treatment  Patient Details  Name: Sean Barajas MRN: [id number] Date of Birth: 07/28/1956 Referring Provider: Tat  Encounter Date: 05/28/2017      OT End of Session - 05/29/17 0939    Visit Number 6   Number of Visits 17   Date for OT Re-Evaluation 06/14/17   OT Start Time 1412   OT Stop Time 1500   OT Time Calculation (min) 48 min   Activity Tolerance Patient tolerated treatment well   Behavior [facility] for tasks assessed/performed      Past Medical History:  Diagnosis Date  . Anxiety   . Coronary artery disease   . Hyperlipidemia   . Hypertension   . Parkinson's disease (HCC)   . Prediabetes     Past Surgical History:  Procedure Laterality Date  . APPENDECTOMY    . CARDIAC CATHETERIZATION    . CAROTID STENT  07/2015  . CHOLECYSTECTOMY    . CORONARY ANGIOPLASTY    . CORONARY ARTERY BYPASS GRAFT    . CORONARY STENT INTERVENTION N/A 01/30/2017   Procedure: Coronary Stent Intervention;  Surgeon: Marcina MillardAlexander Paraschos, MD;  Location: [facility] INVASIVE CV LAB;  Service: Cardiovascular;  Laterality: N/A;  . EYE SURGERY Bilateral   . LEFT HEART CATH AND CORS/GRAFTS ANGIOGRAPHY N/A 01/30/2017   Procedure: Left Heart Cath and Cors/Grafts Angiography and possible PCI;  Surgeon: Marcina MillardAlexander Paraschos, MD;  Location: [facility] INVASIVE CV LAB;  Service: Cardiovascular;  Laterality: N/A;  . NASAL SEPTUM SURGERY    . TONSILLECTOMY      There were no vitals filed for this visit.      Subjective Assessment - 05/29/17 0937    Subjective  Patient reports this has not been a good day for him but he didn't want to miss therapy.  He didn't sleep again last night, felt he was snoring and didn't sleep for more than a few minutes at a time before he would wake up.  Feels exhausted.      Pertinent History Patient  was diagnosed with Parkinson's disease in 2014 and was seen by therapy 1.5 years ago and did well with the LSSVT BIG program.  Since that time he has had multiple health issues including a recent heart attack.  He has been falling the last 6 months on average 3 times a week.  He was referred by his physician for intensive LSVT BIG program to address balance, falls and functional mobility skills.     Patient Stated Goals Patient reports he wants to walk without stumbling, better balance.     Currently in Pain? Yes   Pain Score 2    Pain Location Back   Pain Orientation Right   Pain Descriptors / Indicators Aching   Pain Type Acute pain   Pain Onset 1 to 4 weeks ago   Pain Frequency Intermittent                      OT Treatments/Exercises (OP) - 05/29/17 1659      Neurological Re-education Exercises   Other Exercises 1 Patient seen for instruction of LSVT BIG exercises: LSVT Daily Session Maximal Daily Exercises: Sustained movements are designed to rescale the amplitude of movement output for generalization to daily functional activities. Performed as follows for 1 set of 10 repetitions each: Multi directional sustained movements- 1) Floor  to ceiling, 2) Side to side. Multi directional Repetitive movements performed in standing and are designed to provide retraining effort needed for sustained muscle activation in tasks Performed as follows: 3) Step and reach forward, 4) Step and Reach Backwards, 5) Step and reach sideways, 6) Rock and reach forward/backward, 7) Rock and reach sideways. Patient required CGA assist for all exercises in standing, verbal and tactile cues. Patient required 2 rest breaks this date. Reciprocal toe tapping in standing with SBA and cues to complete for technique. Stair negotiation 5 steps for 5 trials with cues for turns at the stair landing.   Other Exercises 2 Focus on balance tasks involving moving around objects, stepping over objects as well and squattting  to pick up objects. One loss of balance while squatting which required therapist assist to recover.                  OT Education - 05/29/17 [phone]    Education provided Yes   Education Details Balance exercise this date, body positioing with squatting and/or bending over to pick up items   Person(s) Educated Patient   Methods Explanation;Demonstration;Verbal cues   Comprehension Verbal cues required;Returned demonstration;Verbalized understanding             OT Long Term Goals - 05/09/17 2035      OT LONG TERM GOAL #1   Title Patient will improve gait speed and endurance and be able to walk 1650 feet in 6 minutes to negotiate around the home and community safely in 4 weeks.   Baseline 1480 feet at evaluation   Time 4   Period Weeks   Status New     OT LONG TERM GOAL #2   Title Patient will complete HEP for maximal daily exercises with modified independence in 4 weeks   Baseline has not been performing any exercise program since last episode of therapy   Time 4   Period Weeks   Status New     OT LONG TERM GOAL #3   Title Patient will demonstrate strategies for initiation of gait as well as management of gait speed to reduce risk of falls.    Baseline patient tends to either freeze with gait or move impulsively with a forward leaning pattern and decrease control of gait.    Time 4   Period Weeks   Status New     OT LONG TERM GOAL #4   Title Patient will decrease frequency of freezing episodes with score of 8 or less on Freezing of Gait Questionnaire    Baseline score of 9 at evaluation.    Time 4   Period Weeks   Status New               Plan - 05/29/17 0940    Clinical Impression Statement Patient late to therapy this date and reports he doesn't feel well today, hasn't slept much this week and feels really tired.  He reports his sleep apnea is keeping him awake and he has a sore throat on the right side that feels raw and tender when he swallows.  Wants  to try to get an appt with Dr. Markham Jordan to get his esophagus stretched to help with swallowing. Patient continues to focus on maximal daily exercise performance, requires CGA and cues for form and technique. Impulsive at times and requires tactile cues for body position during tasks. Focused this date on squatting to pick up items.  Patient able to complete on flat surface however,  it items are placed in a sequence, patient tends to lean forwards losing his balance going from one item to the next, therapist assist this date for one episode of loss of balance.  He reports this occurs often with his work tasks and he is recovering from bruises to both elbows and knees from previous falls.  He has not had a fall in the last week and reports he is trying to get out of bed more slowly.  Continue to work towards goals.    Rehab Potential Good   Current Impairments/barriers affecting progress: co morbidities, recurrent falls   OT Frequency 4x / week   OT Duration 4 weeks   OT Treatment/Interventions Self-care/ADL training;Therapeutic exercise;Functional Mobility Training;Patient/family education;Neuromuscular education;DME and/or AE instruction;Gait Training;Stair Training   Consulted and Agree with Plan of Care Patient      Patient will benefit from skilled therapeutic intervention in order to improve the following deficits and impairments:  Abnormal gait, Decreased knowledge of use of DME, Decreased strength, Decreased balance, Decreased mobility, Difficulty walking, Decreased coordination, Decreased endurance, Pain, Impaired perceived functional ability  Visit Diagnosis: Difficulty walking  Unsteadiness on feet  Parkinson's disease (HCC)  Muscle weakness (generalized)  Lack of coordination  Unsteady gait    Problem List Patient Active Problem List   Diagnosis Date Noted  . CAD (coronary artery disease) 01/30/2017  . Unstable angina (HCC) 01/29/2017  . Aortic atherosclerosis (HCC) 08/28/2016   . Prediabetes 06/25/2016   [name], OTR/L, CLT  Sean Barajas 05/29/2017, 5:02 PM  [facility] MAIN [facility] SERVICES [address] [place], [place], [address] Phone: [phone]   Fax:  [phone]  Name: Sean Barajas MRN: [id number] Date of Birth: 19-Sep-1956

## 2017-05-30 ENCOUNTER — Ambulatory Visit: Payer: BLUE CROSS/BLUE SHIELD | Admitting: Occupational Therapy

## 2017-05-31 ENCOUNTER — Encounter: Payer: BLUE CROSS/BLUE SHIELD | Admitting: Occupational Therapy

## 2017-06-03 ENCOUNTER — Ambulatory Visit: Payer: BLUE CROSS/BLUE SHIELD | Attending: Neurology | Admitting: Occupational Therapy

## 2017-06-03 DIAGNOSIS — R279 Unspecified lack of coordination: Secondary | ICD-10-CM | POA: Diagnosis present

## 2017-06-03 DIAGNOSIS — R262 Difficulty in walking, not elsewhere classified: Secondary | ICD-10-CM | POA: Insufficient documentation

## 2017-06-03 DIAGNOSIS — R2681 Unsteadiness on feet: Secondary | ICD-10-CM | POA: Diagnosis present

## 2017-06-03 DIAGNOSIS — M6281 Muscle weakness (generalized): Secondary | ICD-10-CM | POA: Diagnosis present

## 2017-06-03 DIAGNOSIS — G20 Parkinson's disease: Secondary | ICD-10-CM | POA: Diagnosis present

## 2017-06-04 ENCOUNTER — Ambulatory Visit: Payer: BLUE CROSS/BLUE SHIELD | Admitting: Occupational Therapy

## 2017-06-04 DIAGNOSIS — R2681 Unsteadiness on feet: Secondary | ICD-10-CM

## 2017-06-04 DIAGNOSIS — M6281 Muscle weakness (generalized): Secondary | ICD-10-CM

## 2017-06-04 DIAGNOSIS — R262 Difficulty in walking, not elsewhere classified: Secondary | ICD-10-CM

## 2017-06-04 DIAGNOSIS — R279 Unspecified lack of coordination: Secondary | ICD-10-CM

## 2017-06-04 DIAGNOSIS — G20 Parkinson's disease: Secondary | ICD-10-CM

## 2017-06-06 ENCOUNTER — Ambulatory Visit: Payer: BLUE CROSS/BLUE SHIELD | Admitting: Occupational Therapy

## 2017-06-07 ENCOUNTER — Ambulatory Visit: Payer: BLUE CROSS/BLUE SHIELD | Admitting: Occupational Therapy

## 2017-06-08 ENCOUNTER — Encounter: Payer: Self-pay | Admitting: Occupational Therapy

## 2017-06-08 NOTE — Therapy (Signed)
[place] [facility] MAIN [facility] SERVICES [address] [place], [place], [address] Phone: [phone]   Fax:  [phone]  Occupational Therapy Treatment  Patient Details  Name: Sean Barajas MRN: [id number] Date of Birth: 1956/11/28 Referring Provider: Tat  Encounter Date: 06/04/2017      OT End of Session - 06/08/17 1915    Visit Number 8   Number of Visits 17   Date for OT Re-Evaluation 06/14/17   OT Start Time 0908   OT Stop Time 1003   OT Time Calculation (min) 55 min   Activity Tolerance Patient tolerated treatment well   Behavior During Therapy [facility] for tasks assessed/performed      Past Medical History:  Diagnosis Date  . Anxiety   . Coronary artery disease   . Hyperlipidemia   . Hypertension   . Parkinson's disease (HCC)   . Prediabetes     Past Surgical History:  Procedure Laterality Date  . APPENDECTOMY    . CARDIAC CATHETERIZATION    . CAROTID STENT  07/2015  . CHOLECYSTECTOMY    . CORONARY ANGIOPLASTY    . CORONARY ARTERY BYPASS GRAFT    . CORONARY STENT INTERVENTION N/A 01/30/2017   Procedure: Coronary Stent Intervention;  Surgeon: Marcina Millard, MD;  Location: [facility] INVASIVE CV LAB;  Service: Cardiovascular;  Laterality: N/A;  . EYE SURGERY Bilateral   . LEFT HEART CATH AND CORS/GRAFTS ANGIOGRAPHY N/A 01/30/2017   Procedure: Left Heart Cath and Cors/Grafts Angiography and possible PCI;  Surgeon: Marcina Millard, MD;  Location: [facility] INVASIVE CV LAB;  Service: Cardiovascular;  Laterality: N/A;  . NASAL SEPTUM SURGERY    . TONSILLECTOMY      There were no vitals filed for this visit.      Subjective Assessment - 06/08/17 1911    Subjective  Patient reports he is going for an appointment today to check on his swallowing and hoping they will stretch his esophagus.     Pertinent History Patient was diagnosed with Parkinson's disease in 2014 and was seen by therapy 1.5 years ago and did well with the LSSVT BIG  program.  Since that time he has had multiple health issues including a recent heart attack.  He has been falling the last 6 months on average 3 times a week.  He was referred by his physician for intensive LSVT BIG program to address balance, falls and functional mobility skills.     Patient Stated Goals Patient reports he wants to walk without stumbling, better balance.     Currently in Pain? Yes   Pain Score 2    Pain Location Back   Pain Orientation Right   Pain Descriptors / Indicators Aching   Pain Type Acute pain   Pain Onset 1 to 4 weeks ago   Pain Frequency Intermittent                      OT Treatments/Exercises (OP) - 06/08/17 1912      ADLs   ADL Comments Patient seen for functional component tasks of bed mobiility with cues, stair negotiation, posture with sit to stand, stooping to pick up items.  Cues provided for body positioning to avoid leaning too far forwards and loosing balance.       Neurological Re-education Exercises   Other Exercises 1 Patient seen for instruction of LSVT BIG exercises: LSVT Daily Session Maximal Daily Exercises: Sustained movements are designed to rescale the amplitude of movement output for generalization  to daily functional activities. Performed as follows for 1 set of 10 repetitions each: Multi directional sustained movements- 1) Floor to ceiling, 2) Side to side. Multi directional Repetitive movements performed in standing and are designed to provide retraining effort needed for sustained muscle activation in tasks Performed as follows: 3) Step and reach forward, 4) Step and Reach Backwards, 5) Step and reach sideways, 6) Rock and reach forward/backward, 7) Rock and reach sideways. Patient required CGA assist for all exercises in standing, verbal and tactile cues. Patient required 3 rest breaks this date. Reciprocal toe tapping in standing with SBA and cues to complete for technique.                 OT Education - 06/08/17  1915    Education provided Yes   Education Details body position, transfers   Person(s) Educated Patient   Methods Explanation;Demonstration;Verbal cues   Comprehension Verbal cues required;Returned demonstration;Verbalized understanding             OT Long Term Goals - 05/09/17 2035      OT LONG TERM GOAL #1   Title Patient will improve gait speed and endurance and be able to walk 1650 feet in 6 minutes to negotiate around the home and community safely in 4 weeks.   Baseline 1480 feet at evaluation   Time 4   Period Weeks   Status New     OT LONG TERM GOAL #2   Title Patient will complete HEP for maximal daily exercises with modified independence in 4 weeks   Baseline has not been performing any exercise program since last episode of therapy   Time 4   Period Weeks   Status New     OT LONG TERM GOAL #3   Title Patient will demonstrate strategies for initiation of gait as well as management of gait speed to reduce risk of falls.    Baseline patient tends to either freeze with gait or move impulsively with a forward leaning pattern and decrease control of gait.    Time 4   Period Weeks   Status New     OT LONG TERM GOAL #4   Title Patient will decrease frequency of freezing episodes with score of 8 or less on Freezing of Gait Questionnaire    Baseline score of 9 at evaluation.    Time 4   Period Weeks   Status New               Plan - 06/08/17 1916    Clinical Impression Statement Patient progressing well with exercises and needs to be more consistent with performance at home twice a day for maximal daily exercises.  Patient continues to have difficulty with balance when he is getting up from sitting as well as when he has to bend over or squat to pick up items.  He tends to stay in flexed position when he rises and falls forwards.  He responds well to cues. Continue to work towards decreasing risk of falls.   Rehab Potential Good   Current Impairments/barriers  affecting progress: co morbidities, recurrent falls   OT Frequency 4x / week   OT Duration 4 weeks   OT Treatment/Interventions Self-care/ADL training;Therapeutic exercise;Functional Mobility Training;Patient/family education;Neuromuscular education;DME and/or AE instruction;Gait Training;Stair Training   Consulted and Agree with Plan of Care Patient      Patient will benefit from skilled therapeutic intervention in order to improve the following deficits and impairments:     Visit Diagnosis:  Difficulty walking  Unsteadiness on feet  Parkinson's disease (HCC)  Muscle weakness (generalized)  Lack of coordination  Unsteady gait    Problem List Patient Active Problem List   Diagnosis Date Noted  . CAD (coronary artery disease) 01/30/2017  . Unstable angina (HCC) 01/29/2017  . Aortic atherosclerosis (HCC) 08/28/2016  . Prediabetes 06/25/2016   Amy T Arne ClevelandLovett, OTR/L, CLT  Lovett,Amy 06/08/2017, 7:20 PM  [place] [facility] [address] [place], [place], [address] Phone: [phone]   Fax:  [phone]  Name: Ulanda EdisonRobert M Hutto MRN: [id number] Date of Birth: 02/05/1956

## 2017-06-08 NOTE — Therapy (Signed)
[place] [facility] MAIN [facility] SERVICES [address] [place], [place], [address] Phone: [phone]   Fax:  [phone]  Occupational Therapy Treatment  Patient Details  Name: Sean Barajas MRN: [id number] Date of Birth: [birth year] Referring Provider: Tat  Encounter Date: 06/03/2017      OT End of Session - 06/08/17 1843    Visit Number 7   Number of Visits 17   Date for OT Re-Evaluation 06/14/17   OT Start Time 1118   OT Stop Time 1215   OT Time Calculation (min) 57 min   Activity Tolerance Patient tolerated treatment well   Behavior During Therapy [name] for tasks assessed/performed      Past Medical History:  Diagnosis Date  . Anxiety   . Coronary artery disease   . Hyperlipidemia   . Hypertension   . Parkinson's disease (HCC)   . Prediabetes     Past Surgical History:  Procedure Laterality Date  . APPENDECTOMY    . CARDIAC CATHETERIZATION    . CAROTID STENT  07/2015  . CHOLECYSTECTOMY    . CORONARY ANGIOPLASTY    . CORONARY ARTERY BYPASS GRAFT    . CORONARY STENT INTERVENTION N/A 01/30/2017   Procedure: Coronary Stent Intervention;  Surgeon: Marcina Millard, MD;  Location: [facility] INVASIVE CV LAB;  Service: Cardiovascular;  Laterality: N/A;  . EYE SURGERY Bilateral   . LEFT HEART CATH AND CORS/GRAFTS ANGIOGRAPHY N/A 01/30/2017   Procedure: Left Heart Cath and Cors/Grafts Angiography and possible PCI;  Surgeon: Marcina Millard, MD;  Location: [facility] INVASIVE CV LAB;  Service: Cardiovascular;  Laterality: N/A;  . NASAL SEPTUM SURGERY    . TONSILLECTOMY      There were no vitals filed for this visit.      Subjective Assessment - 06/08/17 1838    Subjective  Patient reports he still is not sleeping well, cannot get in a comfortable position, tends to snore and wake himself up, throat ends up being tender and sore on the right side.    Pertinent History Patient was diagnosed with Parkinson's disease in 2014 and was seen by  therapy 1.5 years ago and did well with the LSSVT BIG program.  Since that time he has had multiple health issues including a recent heart attack.  He has been falling the last 6 months on average 3 times a week.  He was referred by his physician for intensive LSVT BIG program to address balance, falls and functional mobility skills.     Patient Stated Goals Patient reports he wants to walk without stumbling, better balance.     Currently in Pain? Yes   Pain Score 2    Pain Location Back   Pain Orientation Right   Pain Descriptors / Indicators Aching   Pain Type Acute pain   Pain Onset 1 to 4 weeks ago   Pain Frequency Intermittent   Multiple Pain Sites No                      OT Treatments/Exercises (OP) - 06/08/17 1839      ADLs   ADL Comments Patient seen for transfers from the bed, rolling, sit to stand and initiation of gait.  Cues for posture and body positioning especially with starting to walk.  Stair negotiation 5 steps with cues for turns at the top of the stairwell to return.       Neurological Re-education Exercises   Other Exercises 1 Patient seen for instruction of  LSVT BIG exercises: LSVT Daily Session Maximal Daily Exercises: Sustained movements are designed to rescale the amplitude of movement output for generalization to daily functional activities. Performed as follows for 1 set of 10 repetitions each: Multi directional sustained movements- 1) Floor to ceiling, 2) Side to side. Multi directional Repetitive movements performed in standing and are designed to provide retraining effort needed for sustained muscle activation in tasks Performed as follows: 3) Step and reach forward, 4) Step and Reach Backwards, 5) Step and reach sideways, 6) Rock and reach forward/backward, 7) Rock and reach sideways. Patient required CGA assist for all exercises in standing, verbal and tactile cues. Patient required 2 rest breaks this date. Reciprocal toe tapping in standing with SBA  and cues to complete for technique.    Other Exercises 2 Functional mobility on flat surfaces with bending to pick up objects from the floor and moving forwards with upright body positioning versus forward lean.                  OT Education - 06/08/17 1843    Education provided Yes   Education Details continued focus on balance when starting to walk, after picking up objects.    Person(s) Educated Patient   Methods Explanation;Demonstration;Verbal cues   Comprehension Verbal cues required;Returned demonstration;Verbalized understanding             OT Long Term Goals - 05/09/17 2035      OT LONG TERM GOAL #1   Title Patient will improve gait speed and endurance and be able to walk 1650 feet in 6 minutes to negotiate around the home and community safely in 4 weeks.   Baseline 1480 feet at evaluation   Time 4   Period Weeks   Status New     OT LONG TERM GOAL #2   Title Patient will complete HEP for maximal daily exercises with modified independence in 4 weeks   Baseline has not been performing any exercise program since last episode of therapy   Time 4   Period Weeks   Status New     OT LONG TERM GOAL #3   Title Patient will demonstrate strategies for initiation of gait as well as management of gait speed to reduce risk of falls.    Baseline patient tends to either freeze with gait or move impulsively with a forward leaning pattern and decrease control of gait.    Time 4   Period Weeks   Status New     OT LONG TERM GOAL #4   Title Patient will decrease frequency of freezing episodes with score of 8 or less on Freezing of Gait Questionnaire    Baseline score of 9 at evaluation.    Time 4   Period Weeks   Status New               Plan - 06/08/17 1843    Clinical Impression Statement Patient continues to demo decreased balance when moving from sitting to standing and initiation of gait, he tends to lean to far forwards when moving and gets too much  momentum forwards which is the typical pattern and position for falls. He has not felt well in the last week with complaints of not being able to lie down to sleep, short periods of time sleeping while sitting up.  He has not been consistent with his daily exercises at home due to fatigue.    Rehab Potential Good   Current Impairments/barriers affecting progress: co morbidities, recurrent  falls      Patient will benefit from skilled therapeutic intervention in order to improve the following deficits and impairments:     Visit Diagnosis: Difficulty walking  Unsteadiness on feet  Parkinson's disease (HCC)  Muscle weakness (generalized)  Lack of coordination  Unsteady gait    Problem List Patient Active Problem List   Diagnosis Date Noted  . CAD (coronary artery disease) 01/30/2017  . Unstable angina (HCC) 01/29/2017  . Aortic atherosclerosis (HCC) 08/28/2016  . Prediabetes 06/25/2016   Kaulana Brindle T Arne ClevelandLovett, OTR/L, CLT  Dowell Hoon 06/08/2017, 6:49 PM  [place] [facility] [address] [place], [place], [address] Phone: [phone]   Fax:  [phone]  Name: Sean Barajas MRN: [id number] Date of Birth: 06/08/1956

## 2017-06-10 ENCOUNTER — Ambulatory Visit: Payer: BLUE CROSS/BLUE SHIELD | Admitting: Occupational Therapy

## 2017-06-11 ENCOUNTER — Other Ambulatory Visit: Payer: Self-pay | Admitting: Nurse Practitioner

## 2017-06-11 ENCOUNTER — Ambulatory Visit: Payer: BLUE CROSS/BLUE SHIELD | Admitting: Occupational Therapy

## 2017-06-11 DIAGNOSIS — G20 Parkinson's disease: Secondary | ICD-10-CM

## 2017-06-11 DIAGNOSIS — R1310 Dysphagia, unspecified: Secondary | ICD-10-CM

## 2017-06-12 ENCOUNTER — Encounter: Payer: Self-pay | Admitting: Emergency Medicine

## 2017-06-12 ENCOUNTER — Inpatient Hospital Stay
Admission: EM | Admit: 2017-06-12 | Discharge: 2017-06-14 | DRG: 246 | Disposition: A | Discharge status: Home / Self Care | Payer: BLUE CROSS/BLUE SHIELD | Attending: Internal Medicine | Admitting: Internal Medicine

## 2017-06-12 ENCOUNTER — Ambulatory Visit: Payer: BLUE CROSS/BLUE SHIELD | Admitting: Occupational Therapy

## 2017-06-12 ENCOUNTER — Emergency Department: Payer: BLUE CROSS/BLUE SHIELD

## 2017-06-12 DIAGNOSIS — F419 Anxiety disorder, unspecified: Secondary | ICD-10-CM | POA: Diagnosis present

## 2017-06-12 DIAGNOSIS — I25110 Atherosclerotic heart disease of native coronary artery with unstable angina pectoris: Principal | ICD-10-CM | POA: Diagnosis present

## 2017-06-12 DIAGNOSIS — Z91041 Radiographic dye allergy status: Secondary | ICD-10-CM | POA: Diagnosis not present

## 2017-06-12 DIAGNOSIS — Z951 Presence of aortocoronary bypass graft: Secondary | ICD-10-CM | POA: Diagnosis not present

## 2017-06-12 DIAGNOSIS — Z79899 Other long term (current) drug therapy: Secondary | ICD-10-CM

## 2017-06-12 DIAGNOSIS — Z955 Presence of coronary angioplasty implant and graft: Secondary | ICD-10-CM

## 2017-06-12 DIAGNOSIS — I200 Unstable angina: Secondary | ICD-10-CM

## 2017-06-12 DIAGNOSIS — R079 Chest pain, unspecified: Secondary | ICD-10-CM

## 2017-06-12 DIAGNOSIS — E785 Hyperlipidemia, unspecified: Secondary | ICD-10-CM | POA: Diagnosis present

## 2017-06-12 DIAGNOSIS — Z7982 Long term (current) use of aspirin: Secondary | ICD-10-CM | POA: Diagnosis not present

## 2017-06-12 DIAGNOSIS — I214 Non-ST elevation (NSTEMI) myocardial infarction: Secondary | ICD-10-CM | POA: Diagnosis present

## 2017-06-12 DIAGNOSIS — I10 Essential (primary) hypertension: Secondary | ICD-10-CM

## 2017-06-12 DIAGNOSIS — Z7901 Long term (current) use of anticoagulants: Secondary | ICD-10-CM

## 2017-06-12 DIAGNOSIS — Z66 Do not resuscitate: Secondary | ICD-10-CM | POA: Diagnosis present

## 2017-06-12 DIAGNOSIS — Z882 Allergy status to sulfonamides status: Secondary | ICD-10-CM | POA: Diagnosis not present

## 2017-06-12 DIAGNOSIS — I25710 Atherosclerosis of autologous vein coronary artery bypass graft(s) with unstable angina pectoris: Secondary | ICD-10-CM | POA: Diagnosis not present

## 2017-06-12 DIAGNOSIS — G20 Parkinson's disease: Secondary | ICD-10-CM | POA: Diagnosis present

## 2017-06-12 LAB — LIPID PANEL
CHOLESTEROL: 138 mg/dL (ref 0–200)
HDL: 35 mg/dL — ABNORMAL LOW (ref >40)
LDL Cholesterol: 67 mg/dL (ref 0–99)
Total CHOL/HDL Ratio: 3.9 RATIO
Triglycerides: 178 mg/dL — ABNORMAL HIGH (ref <150)
VLDL: 36 mg/dL (ref 0–40)

## 2017-06-12 LAB — CBC
HCT: 42.6 % (ref 40.0–52.0)
HCT: 42.7 % (ref 40.0–52.0)
HEMOGLOBIN: 14.8 g/dL (ref 13.0–18.0)
Hemoglobin: 14.9 g/dL (ref 13.0–18.0)
MCH: 31.5 pg (ref 26.0–34.0)
MCH: 31.4 pg (ref 26.0–34.0)
MCHC: 35 g/dL (ref 32.0–36.0)
MCHC: 34.6 g/dL (ref 32.0–36.0)
MCV: 90.1 fL (ref 80.0–100.0)
MCV: 90.8 fL (ref 80.0–100.0)
PLATELETS: 140 10*3/uL — AB (ref 150–440)
PLATELETS: 149 10*3/uL — AB (ref 150–440)
RBC: 4.73 MIL/uL (ref 4.40–5.90)
RBC: 4.7 MIL/uL (ref 4.40–5.90)
RDW: 14.6 % — AB (ref 11.5–14.5)
RDW: 14.7 % — ABNORMAL HIGH (ref 11.5–14.5)
WBC: 3.9 10*3/uL (ref 3.8–10.6)
WBC: 4.3 10*3/uL (ref 3.8–10.6)

## 2017-06-12 LAB — BASIC METABOLIC PANEL
ANION GAP: 9 (ref 5–15)
Anion gap: 7 (ref 5–15)
BUN: 14 mg/dL (ref 6–20)
BUN: 13 mg/dL (ref 6–20)
CALCIUM: 9.3 mg/dL (ref 8.9–10.3)
CALCIUM: 9.1 mg/dL (ref 8.9–10.3)
CO2: 28 mmol/L (ref 22–32)
CO2: 27 mmol/L (ref 22–32)
CREATININE: 0.98 mg/dL (ref 0.61–1.24)
Chloride: 103 mmol/L (ref 101–111)
Chloride: 103 mmol/L (ref 101–111)
Creatinine, Ser: 1.11 mg/dL (ref 0.61–1.24)
GFR calc Af Amer: >60 mL/min (ref >60)
GFR calc non Af Amer: >60 mL/min (ref >60)
GLUCOSE: 135 mg/dL — AB (ref 65–99)
Glucose, Bld: 176 mg/dL — ABNORMAL HIGH (ref 65–99)
Potassium: 3.7 mmol/L (ref 3.5–5.1)
Potassium: 3.9 mmol/L (ref 3.5–5.1)
SODIUM: 138 mmol/L (ref 135–145)
Sodium: 139 mmol/L (ref 135–145)

## 2017-06-12 LAB — TROPONIN I
Troponin I: <0.03 ng/mL (ref <0.03)
Troponin I: 0.05 ng/mL (ref <0.03)
Troponin I: 0.04 ng/mL (ref <0.03)
Troponin I: 0.05 ng/mL (ref <0.03)

## 2017-06-12 LAB — TSH: TSH: 2.556 u[IU]/mL (ref 0.350–4.500)

## 2017-06-12 LAB — PROTIME-INR
INR: 1.05
INR: 1.08
PROTHROMBIN TIME: 13.7 s (ref 11.4–15.2)
Prothrombin Time: 14 seconds (ref 11.4–15.2)

## 2017-06-12 LAB — HEPARIN LEVEL (UNFRACTIONATED)
HEPARIN UNFRACTIONATED: 0.59 [IU]/mL (ref 0.30–0.70)
Heparin Unfractionated: 0.61 IU/mL (ref 0.30–0.70)

## 2017-06-12 LAB — APTT: aPTT: 30 seconds (ref 24–36)

## 2017-06-12 LAB — MAGNESIUM: Magnesium: 2.2 mg/dL (ref 1.7–2.4)

## 2017-06-12 MED ORDER — CARBIDOPA-LEVODOPA 25-100 MG PO TABS
3.0000 | ORAL_TABLET | Freq: Every day | ORAL | Status: DC
  Administered 2017-06-13 – 2017-06-14 (×2): 3 via ORAL
  Filled 2017-06-12 (×2): qty 3

## 2017-06-12 MED ORDER — NITROGLYCERIN IN D5W 200-5 MCG/ML-% IV SOLN
5.0000 ug/min | INTRAVENOUS | Status: DC
  Administered 2017-06-12: 5 ug/min via INTRAVENOUS

## 2017-06-12 MED ORDER — HEPARIN (PORCINE) IN NACL 100-0.45 UNIT/ML-% IJ SOLN
1300.0000 [IU]/h | INTRAMUSCULAR | Status: DC
  Administered 2017-06-12: 1300 [IU]/h via INTRAVENOUS
  Filled 2017-06-12: qty 250

## 2017-06-12 MED ORDER — ASPIRIN 81 MG PO CHEW
324.0000 mg | CHEWABLE_TABLET | Freq: Once | ORAL | Status: AC
  Administered 2017-06-12: 324 mg via ORAL
  Filled 2017-06-12: qty 4

## 2017-06-12 MED ORDER — NITROGLYCERIN 2 % TD OINT
1.0000 [in_us] | TOPICAL_OINTMENT | Freq: Once | TRANSDERMAL | Status: AC
  Administered 2017-06-12: 1 [in_us] via TOPICAL
  Filled 2017-06-12: qty 1

## 2017-06-12 MED ORDER — CARBIDOPA-LEVODOPA ER 50-200 MG PO TBCR: 1.0000 | EXTENDED_RELEASE_TABLET | Freq: Every day | ORAL | Status: DC

## 2017-06-12 MED ORDER — PHENOL 1.4 % MT LIQD
1.0000 | OROMUCOSAL | Status: DC | PRN
  Administered 2017-06-12: 1 via OROMUCOSAL
  Filled 2017-06-12 (×2): qty 177

## 2017-06-12 MED ORDER — CARBIDOPA-LEVODOPA 25-100 MG PO TABS
3.0000 | ORAL_TABLET | Freq: Four times a day (QID) | ORAL | Status: DC
  Administered 2017-06-12: 3 via ORAL
  Filled 2017-06-12 (×2): qty 3

## 2017-06-12 MED ORDER — ONDANSETRON HCL 4 MG/2ML IJ SOLN
4.0000 mg | Freq: Once | INTRAMUSCULAR | Status: AC
  Administered 2017-06-12: 4 mg via INTRAVENOUS
  Filled 2017-06-12: qty 2

## 2017-06-12 MED ORDER — ALPRAZOLAM 0.5 MG PO TABS
0.5000 mg | ORAL_TABLET | Freq: Three times a day (TID) | ORAL | Status: DC
  Administered 2017-06-12 – 2017-06-14 (×5): 0.5 mg via ORAL
  Filled 2017-06-12 (×5): qty 1

## 2017-06-12 MED ORDER — ONDANSETRON HCL 4 MG/2ML IJ SOLN: 4.0000 mg | Freq: Four times a day (QID) | INTRAMUSCULAR | Status: DC | PRN

## 2017-06-12 MED ORDER — LISINOPRIL 5 MG PO TABS
2.5000 mg | ORAL_TABLET | Freq: Every day | ORAL | Status: DC
  Administered 2017-06-12 – 2017-06-14 (×3): 2.5 mg via ORAL
  Filled 2017-06-12 (×3): qty 1

## 2017-06-12 MED ORDER — VALACYCLOVIR HCL 500 MG PO TABS
500.0000 mg | ORAL_TABLET | Freq: Two times a day (BID) | ORAL | Status: DC
  Administered 2017-06-13: 500 mg via ORAL
  Filled 2017-06-12 (×6): qty 1

## 2017-06-12 MED ORDER — ALBUTEROL SULFATE (2.5 MG/3ML) 0.083% IN NEBU: 3.0000 mL | INHALATION_SOLUTION | Freq: Four times a day (QID) | RESPIRATORY_TRACT | Status: DC | PRN

## 2017-06-12 MED ORDER — ISOSORBIDE MONONITRATE ER 30 MG PO TB24
30.0000 mg | ORAL_TABLET | Freq: Every day | ORAL | Status: DC
  Administered 2017-06-12 – 2017-06-14 (×3): 30 mg via ORAL
  Filled 2017-06-12 (×3): qty 1

## 2017-06-12 MED ORDER — ACETAMINOPHEN 500 MG PO TABS
1000.0000 mg | ORAL_TABLET | Freq: Four times a day (QID) | ORAL | Status: DC | PRN
  Administered 2017-06-13 – 2017-06-14 (×2): 1000 mg via ORAL
  Filled 2017-06-12 (×2): qty 2

## 2017-06-12 MED ORDER — CARBIDOPA-LEVODOPA 25-100 MG PO TABS
2.0000 | ORAL_TABLET | Freq: Every day | ORAL | Status: DC
  Administered 2017-06-12: 2 via ORAL
  Filled 2017-06-12 (×3): qty 2

## 2017-06-12 MED ORDER — ROSUVASTATIN CALCIUM 10 MG PO TABS
20.0000 mg | ORAL_TABLET | Freq: Every day | ORAL | Status: DC
  Administered 2017-06-12 – 2017-06-13 (×2): 20 mg via ORAL
  Filled 2017-06-12 (×2): qty 2

## 2017-06-12 MED ORDER — CARBIDOPA-LEVODOPA ER 50-200 MG PO TBCR
1.0000 | EXTENDED_RELEASE_TABLET | Freq: Every day | ORAL | Status: DC
  Administered 2017-06-12 – 2017-06-13 (×2): 1 via ORAL
  Filled 2017-06-12 (×3): qty 1

## 2017-06-12 MED ORDER — ASPIRIN EC 81 MG PO TBEC
81.0000 mg | DELAYED_RELEASE_TABLET | Freq: Every day | ORAL | Status: DC
  Administered 2017-06-12 – 2017-06-14 (×3): 81 mg via ORAL
  Filled 2017-06-12 (×3): qty 1

## 2017-06-12 MED ORDER — NITROGLYCERIN IN D5W 200-5 MCG/ML-% IV SOLN: 0.0000 ug/min | INTRAVENOUS | Status: DC

## 2017-06-12 MED ORDER — CARBIDOPA-LEVODOPA 25-100 MG PO TABS
2.0000 | ORAL_TABLET | Freq: Every day | ORAL | Status: DC
  Administered 2017-06-12 – 2017-06-13 (×2): 2 via ORAL
  Filled 2017-06-12 (×3): qty 2

## 2017-06-12 MED ORDER — ROTIGOTINE 2 MG/24HR TD PT24
1.0000 | MEDICATED_PATCH | Freq: Every day | TRANSDERMAL | Status: DC
  Filled 2017-06-12 (×3): qty 1

## 2017-06-12 MED ORDER — TICAGRELOR 90 MG PO TABS
90.0000 mg | ORAL_TABLET | Freq: Two times a day (BID) | ORAL | Status: DC
  Administered 2017-06-12 – 2017-06-14 (×5): 90 mg via ORAL
  Filled 2017-06-12 (×5): qty 1

## 2017-06-12 MED ORDER — MORPHINE SULFATE (PF) 4 MG/ML IV SOLN
4.0000 mg | Freq: Once | INTRAVENOUS | Status: AC
  Administered 2017-06-12: 4 mg via INTRAVENOUS
  Filled 2017-06-12: qty 1

## 2017-06-12 MED ORDER — HEPARIN (PORCINE) IN NACL 100-0.45 UNIT/ML-% IJ SOLN: 14.0000 [IU]/kg/h | Freq: Once | INTRAMUSCULAR | Status: DC

## 2017-06-12 MED ORDER — CARBIDOPA-LEVODOPA 25-100 MG PO TABS
3.0000 | ORAL_TABLET | Freq: Every day | ORAL | Status: DC
  Administered 2017-06-12 – 2017-06-13 (×2): 3 via ORAL
  Filled 2017-06-12 (×3): qty 3

## 2017-06-12 MED ORDER — MORPHINE SULFATE (PF) 4 MG/ML IV SOLN
INTRAVENOUS | Status: AC
  Filled 2017-06-12: qty 1

## 2017-06-12 MED ORDER — HEPARIN BOLUS VIA INFUSION
4000.0000 [IU] | Freq: Once | INTRAVENOUS | Status: AC
  Administered 2017-06-12: 4000 [IU] via INTRAVENOUS
  Filled 2017-06-12: qty 4000

## 2017-06-12 MED ORDER — NITROGLYCERIN IN D5W 200-5 MCG/ML-% IV SOLN
INTRAVENOUS | Status: AC
  Filled 2017-06-12: qty 250

## 2017-06-12 MED ORDER — CARBIDOPA-LEVODOPA 25-100 MG PO TABS
2.0000 | ORAL_TABLET | Freq: Every day | ORAL | Status: DC
  Administered 2017-06-13 – 2017-06-14 (×2): 2 via ORAL
  Filled 2017-06-12 (×2): qty 2

## 2017-06-12 MED ORDER — CARVEDILOL 6.25 MG PO TABS
6.2500 mg | ORAL_TABLET | Freq: Two times a day (BID) | ORAL | Status: DC
  Administered 2017-06-12 – 2017-06-14 (×5): 6.25 mg via ORAL
  Filled 2017-06-12 (×5): qty 1

## 2017-06-12 MED ORDER — HEPARIN SODIUM (PORCINE) 5000 UNIT/ML IJ SOLN
4000.0000 [IU] | Freq: Once | INTRAMUSCULAR | Status: DC
  Filled 2017-06-12: qty 1

## 2017-06-12 NOTE — ED Triage Notes (Signed)
Pt arrived to ED per EMS from home with c/o left sided chest pain that radiates into left RM, per pt symptoms started 2 days ago and have worsened in last few hours. Pt took 2 nitroglycerin before EMS arrived. Pt mumbling upon arrival, pt able to answer questions in complete sentences, A&O x4. Pts eyes are red, BP is 202/109. MD made aware.

## 2017-06-12 NOTE — ED Provider Notes (Signed)
[facility] Emergency Department Provider Note   ____________________________________________   First MD Initiated Contact with Patient 06/12/17 0235     (approximate)  I have reviewed the triage vital signs and the nursing notes.   HISTORY  Chief Complaint Chest Pain    HPI Sean Barajas is a 61 y.o. male brought to the ED from home via EMS with a chief complain of chest pain. Patient has a history of CAD status post stents, on Brilinta, who reports intermittent left-sided chest pressure for the past 2 days. Waxing/waning pressure which worsened in the last few hours. Denies associated diaphoresis, shortness of breath, nausea, vomiting or dizziness. Patient took 2 nitroglycerin prior to EMS arrival with partial relief of symptoms.Denies recent fever, chills, abdominal pain, dysuria. Denies recent travel or trauma.   Past Medical History:  Diagnosis Date  . Anxiety   . Coronary artery disease   . Hyperlipidemia   . Hypertension   . Parkinson's disease (HCC)   . Prediabetes     Patient Active Problem List   Diagnosis Date Noted  . CAD (coronary artery disease) 01/30/2017  . Unstable angina (HCC) 01/29/2017  . Aortic atherosclerosis (HCC) 08/28/2016  . Prediabetes 06/25/2016    Past Surgical History:  Procedure Laterality Date  . APPENDECTOMY    . CARDIAC CATHETERIZATION    . CAROTID STENT  07/2015  . CHOLECYSTECTOMY    . CORONARY ANGIOPLASTY    . CORONARY ARTERY BYPASS GRAFT    . CORONARY STENT INTERVENTION N/A 01/30/2017   Procedure: Coronary Stent Intervention;  Surgeon: Marcina MillardAlexander Paraschos, MD;  Location: [facility] INVASIVE CV LAB;  Service: Cardiovascular;  Laterality: N/A;  . EYE SURGERY Bilateral   . LEFT HEART CATH AND CORS/GRAFTS ANGIOGRAPHY N/A 01/30/2017   Procedure: Left Heart Cath and Cors/Grafts Angiography and possible PCI;  Surgeon: Marcina MillardAlexander Paraschos, MD;  Location: [facility] INVASIVE CV LAB;  Service: Cardiovascular;  Laterality:  N/A;  . NASAL SEPTUM SURGERY    . TONSILLECTOMY      Prior to Admission medications   Medication Sig Start Date End Date Taking? Authorizing Provider  acetaminophen (TYLENOL) 500 MG tablet Take 1,000 mg by mouth every 6 (six) hours as needed for mild pain.    [provider]  albuterol (PROVENTIL HFA;VENTOLIN HFA) 108 (90 BASE) MCG/ACT inhaler Inhale 2 puffs into the lungs every 6 (six) hours as needed for wheezing or shortness of breath.    [provider]  ALPRAZolam Prudy Feeler(XANAX) 0.5 MG tablet Take 0.5 mg by mouth 3 (three) times daily. Dr Malvin JohnsPotter    [provider]  ARTIFICIAL TEAR OP Apply 1 drop to eye as needed (for dry eyes).    [provider]  aspirin EC 81 MG tablet Take 81 mg by mouth daily.    [provider]  carbidopa-levodopa (SINEMET CR) 50-200 MG tablet Take 1 tablet by mouth at bedtime. 12/28/16   Tat, Octaviano Battyebecca S, DO  carbidopa-levodopa (SINEMET IR) 25-100 MG tablet Take 2 tablets by mouth 6 (six) times daily. Patient taking differently: Take 3 tablets by mouth 4 (four) times daily.  12/28/16   Tat, Octaviano Battyebecca S, DO  carvedilol (COREG) 6.25 MG tablet Take 6.25 mg by mouth 2 (two) times daily.     [provider]  doxycycline (VIBRA-TABS) 100 MG tablet Take 1 tablet (100 mg total) by mouth 2 (two) times daily. 05/14/17   Duanne LimerickJones, Deanna C, MD  isosorbide mononitrate (IMDUR) 30 MG 24 hr tablet Take 1 tablet (30  mg total) by mouth daily. 01/31/17   Delfino Lovett, MD  lisinopril (ZESTRIL) 2.5 MG tablet Take 1 tablet (2.5 mg total) by mouth daily. 01/31/17   Delfino Lovett, MD  nitroGLYCERIN (NITROSTAT) 0.4 MG SL tablet Place 0.4 mg under the tongue every 5 (five) minutes as needed for chest pain.    [provider]  phenol (CHLORASEPTIC) 1.4 % LIQD Use as directed 1 spray in the mouth or throat as needed for throat irritation / pain.    [provider]  rosuvastatin (CRESTOR) 20 MG tablet Take 20 mg by mouth at bedtime.    [provider]  rotigotine (NEUPRO) 2 MG/24HR Place 1 patch onto the skin daily. 05/13/17   Tat, Octaviano Batty, DO  rotigotine (NEUPRO) 4 MG/24HR Place 1 patch onto the skin daily. Patient not taking: Reported on 05/13/2017 03/14/17   Tat, Octaviano Batty, DO  ticagrelor (BRILINTA) 90 MG TABS tablet Take 90 mg by mouth every 12 (twelve) hours.    [provider]  valACYclovir (VALTREX) 500 MG tablet Take 500 mg by mouth 2 (two) times daily.    [provider]    Allergies Iodinated diagnostic agents and Sulfa antibiotics  History reviewed. No pertinent family history.  Social History Social History  Substance Use Topics  . Smoking status: Never Smoker  . Smokeless tobacco: Never Used  . Alcohol use 0.0 oz/week     Comment: 1 glass wine/month    Review of Systems  Constitutional: No fever/chills. Eyes: No visual changes. ENT: No sore throat. Cardiovascular: Positive for chest pain. Respiratory: Denies shortness of breath. Gastrointestinal: No abdominal pain.  No nausea, no vomiting.  No diarrhea.  No constipation. Genitourinary: Negative for dysuria. Musculoskeletal: Negative for back pain. Skin: Negative for rash. Neurological: Negative for headaches, focal weakness or numbness.   ____________________________________________   PHYSICAL EXAM:  VITAL SIGNS: ED Triage Vitals  Enc Vitals Group     BP 06/12/17 0214 (!) 198/109     Pulse Rate 06/12/17 0214 66     Resp 06/12/17 0214 15     Temp --      Temp src --      SpO2 06/12/17 0214 98 %     Weight 06/12/17 0213 217 lb (98.4 kg)     Height --      Head Circumference --      Peak Flow --      Pain Score --      Pain Loc --      Pain Edu? --      Excl. in GC? --     Constitutional: Alert and oriented. Well appearing and in no acute distress. Eyes: Conjunctivae are normal. PERRL. EOMI. Head: Atraumatic. Nose: No congestion/rhinnorhea. Mouth/Throat: Mucous membranes are moist.  Oropharynx  non-erythematous. Neck: No stridor.   Cardiovascular: Normal rate, regular rhythm. Grossly normal heart sounds.  Good peripheral circulation. Respiratory: Normal respiratory effort.  No retractions. Lungs CTAB. Gastrointestinal: Soft and nontender. No distention. No abdominal bruits. No CVA tenderness. Musculoskeletal: No lower extremity tenderness nor edema.  No joint effusions. Neurologic:  Normal speech and language. No gross focal neurologic deficits are appreciated.  Skin:  Skin is warm, dry and intact. No rash noted. Psychiatric: Mood and affect are flat. Speech and behavior are normal.  ____________________________________________   LABS (all labs ordered are listed, but only abnormal results are displayed)  Labs Reviewed  BASIC METABOLIC PANEL - Abnormal; Notable for the following:  Result Value   Glucose, Bld 176 (*)    All other components within normal limits  CBC - Abnormal; Notable for the following:    RDW 14.6 (*)    Platelets 140 (*)    All other components within normal limits  TROPONIN I   ____________________________________________  EKG  ED ECG REPORT I, SUNG,JADE J, the attending physician, personally viewed and interpreted this ECG.   Date: 06/12/2017  EKG Time: 0215  Rate: 68  Rhythm: normal EKG, normal sinus rhythm  Axis: Normal  Intervals:none  ST&T Change: Nonspecific  ____________________________________________  RADIOLOGY  Dg Chest Portable 1 View  Result Date: 06/12/2017 CLINICAL DATA:  Chest pain EXAM: PORTABLE CHEST 1 VIEW COMPARISON:  01/29/2017 FINDINGS: Post sternotomy changes. Minimal atelectasis at the left base. Borderline cardiomegaly. No edema. No pneumothorax. IMPRESSION: Minimal atelectasis at the left base. Electronically Signed   By: Jasmine Pang M.D.   On: 06/12/2017 02:35    ____________________________________________   PROCEDURES  Procedure(s) performed: None  Procedures  Critical Care performed: Yes, see  critical care note(s)   CRITICAL CARE Performed by: Irean Hong   Total critical care time: 30 minutes  Critical care time was exclusive of separately billable procedures and treating other patients.  Critical care was necessary to treat or prevent imminent or life-threatening deterioration.  Critical care was time spent personally by me on the following activities: development of treatment plan with patient and/or surrogate as well as nursing, discussions with consultants, evaluation of patient's response to treatment, examination of patient, obtaining history from patient or surrogate, ordering and performing treatments and interventions, ordering and review of laboratory studies, ordering and review of radiographic studies, pulse oximetry and re-evaluation of patient's condition.  ____________________________________________   INITIAL IMPRESSION / ASSESSMENT AND PLAN / ED COURSE  Pertinent labs & imaging results that were available during my care of the patient were reviewed by me and considered in my medical decision making (see chart for details).  61 year old male with CAD status post stents, on Brilinta who presents with chest pain concerning for unstable angina. Will administer aspirin, morphine and nitroglycerin paste; check screening lab work including troponin, chest x-ray. Anticipate hospitalization.  Clinical Course as of Jun 12 342  Wed Jun 12, 2017  0319 Discussed with hospitalist to evaluate patient in the emergency department for admission.  [JS]  [id number] Reports persistent symptoms despite morphine and nitroglycerin paste. Will initiate nitroglycerin drip.  [JS]  0330 Will also initiate heparin drip.  [JS]    Clinical Course User Index [JS] Irean Hong, MD     ____________________________________________   FINAL CLINICAL IMPRESSION(S) / ED DIAGNOSES  Final diagnoses:  Chest pain, unspecified type  Unstable angina [facility])  Essential hypertension      NEW  MEDICATIONS STARTED DURING THIS VISIT:  New Prescriptions   No medications on file     Note:  This document was prepared using Dragon voice recognition software and may include unintentional dictation errors.    Irean Hong, MD 06/12/17 [phone]

## 2017-06-12 NOTE — Progress Notes (Signed)
[facility] Cardiology [facility] Encounter Note  Patient: Sean Barajas / Admit Date: 06/12/2017 / Date of Encounter: 06/12/2017, 5:33 PM   Subjective: Feeling well no chest pain. Troponin onlyu mnimally elevated  Review of Systems: Positive ZOX:WRUE Negative for: Vision change, hearing change, syncope, dizziness, nausea, vomiting,diarrhea, bloody stool, stomach pain, cough, congestion, diaphoresis, urinary frequency, urinary pain,skin lesions, skin rashes Others previously listed  Objective: Telemetry: nsr Physical Exam: Blood pressure 110/60, pulse (!) 48, temperature 97.7 F (36.5 C), temperature source Oral, resp. rate 20, height 5\' 11"  (1.803 m), weight 98.4 kg (217 lb), SpO2 96 %. Body mass index is 30.27 kg/m. General: Well developed, well nourished, in no acute distress. Head: Normocephalic, atraumatic, sclera non-icteric, no xanthomas, nares are without discharge. Neck: No apparent masses Lungs: Normal respirations with no wheezes, no rhonchi, no rales , no crackles   Heart: Regular rate and rhythm, normal S1 S2, no murmur, no rub, no gallop, PMI is normal size and placement, carotid upstroke normal without bruit, jugular venous pressure normal Abdomen: Soft, non-tender, non-distended with normoactive bowel sounds. No hepatosplenomegaly. Abdominal aorta is normal size without bruit Extremities: No edema, no clubbing, no cyanosis, no ulcers,  Peripheral: 2+ radial, 2+ femoral, 2+ dorsal pedal pulses Neuro: Alert and oriented. Moves all extremities spontaneously. Psych:  Responds to questions appropriately with a normal affect.   Intake/Output Summary (Last 24 hours) at 06/12/17 1733 Last data filed at 06/12/17 0646  Gross per 24 hour  Intake             37.7 ml  Output                0 ml  Net             37.7 ml    Inpatient Medications:  . ALPRAZolam  0.5 mg Oral TID  . aspirin EC  81 mg Oral Daily  . carbidopa-levodopa  1 tablet Oral QHS  . [START ON  06/13/2017] carbidopa-levodopa  3 tablet Oral Q0600   And  . [START ON 06/13/2017] carbidopa-levodopa  2 tablet Oral Q0600   And  . carbidopa-levodopa  3 tablet Oral Q0600   And  . carbidopa-levodopa  2 tablet Oral Q0600   And  . carbidopa-levodopa  2 tablet Oral Q0600  . carvedilol  6.25 mg Oral BID  . isosorbide mononitrate  30 mg Oral Daily  . lisinopril  2.5 mg Oral Daily  . rosuvastatin  20 mg Oral QHS  . rotigotine  1 patch Transdermal Daily  . ticagrelor  90 mg Oral Q12H  . valACYclovir  500 mg Oral BID   Infusions:   Labs:  Recent Labs  06/12/17 0218 06/12/17 0907  NA 138 139  K 3.7 3.9  CL 103 103  CO2 28 27  GLUCOSE 176* 135*  BUN 14 13  CREATININE 1.11 0.98  CALCIUM 9.3 9.1  MG  --  2.2   No results for input(s): AST, ALT, ALKPHOS, BILITOT, PROT, ALBUMIN in the last 72 hours.  Recent Labs  06/12/17 0218 06/12/17 0907  WBC 3.9 4.3  HGB 14.9 14.8  HCT 42.6 42.7  MCV 90.1 90.8  PLT 140* 149*    Recent Labs  06/12/17 0218 06/12/17 0907 06/12/17 1153  TROPONINI <0.03 0.05* 0.04*   Invalid input(s): POCBNP No results for input(s): HGBA1C in the last 72 hours.   Weights: Filed Weights   06/12/17 0213  Weight: 98.4 kg (217 lb)     Radiology/Studies:  Dg  Chest Portable 1 View  Result Date: 06/12/2017 CLINICAL DATA:  Chest pain EXAM: PORTABLE CHEST 1 VIEW COMPARISON:  01/29/2017 FINDINGS: Post sternotomy changes. Minimal atelectasis at the left base. Borderline cardiomegaly. No edema. No pneumothorax. IMPRESSION: Minimal atelectasis at the left base. Electronically Signed   By: Jasmine Pang M.D.   On: 06/12/2017 02:35   Dg Swallowing Func-speech Pathology  Result Date: 05/23/2017 Objective Swallowing Evaluation: Type of Study: MBS-Modified Barium Swallow Study Patient Details Name: Sean Barajas MRN: [id number] Date of Birth: [birth year] Today's Date: 05/23/2017 Time: No Data Recorded-No Data Recorded No Data Recorded Past Medical History: Past  Medical History: Diagnosis Date . Anxiety  . Coronary artery disease  . Hyperlipidemia  . Hypertension  . Parkinson's disease (HCC)  . Prediabetes  Past Surgical History: Past Surgical History: Procedure Laterality Date . APPENDECTOMY   . CARDIAC CATHETERIZATION   . CAROTID STENT  07/2015 . CHOLECYSTECTOMY   . CORONARY ANGIOPLASTY   . CORONARY ARTERY BYPASS GRAFT   . CORONARY STENT INTERVENTION N/A 01/30/2017  Procedure: Coronary Stent Intervention;  Surgeon: Marcina Millard, MD;  Location: [facility] INVASIVE CV LAB;  Service: Cardiovascular;  Laterality: N/A; . EYE SURGERY Bilateral  . LEFT HEART CATH AND CORS/GRAFTS ANGIOGRAPHY N/A 01/30/2017  Procedure: Left Heart Cath and Cors/Grafts Angiography and possible PCI;  Surgeon: Marcina Millard, MD;  Location: [facility] INVASIVE CV LAB;  Service: Cardiovascular;  Laterality: N/A; . NASAL SEPTUM SURGERY   . TONSILLECTOMY   No Data Recorded No Data Recorded Assessment / Plan / Recommendation CHL IP CLINICAL IMPRESSIONS 05/23/2017 Clinical Impression Pt exhibits mild-moderate oropharyngeal dysphagia and mild esophageal dysphagia as evidenced by minimal oral propulsion/weak lingual manipulation (solids), delay in the initiation of the swallow with all consistencies to the valleculae, trace aspiration without chin tuck technique utilized with larger boluses of nectar-thickened liquids; with utilization of chin tuck with smaller sips, trace penetration only noted without aspiration with subsequent swallows of nectar-thickened liquids and thin liquids. Decreased tongue-base retraction/pharyngeal weakness noted with mild pharyngeal residue resulting, but cleared with repetitive swallows. Possible backflow noted with last swallow of solids during MBSS, so esophageal assessment may be indicated to r/o any esophageal deficits; pt c/o recent "reflux episodes" during intake of pmhx. Recommend Dysphagia 3 (mechanical/soft) diet with thin liquids utilizing chin tuck AT ALL TIMES during  liquid intake d/t moderate risk for aspiration without use of chin tuck.   SLP Visit Diagnosis Dysphagia, oropharyngeal phase (R13.12);Dysphagia, pharyngoesophageal phase (R13.14) Attention and concentration deficit following -- Frontal lobe and executive function deficit following -- Impact on safety and function Mild aspiration risk   No flowsheet data found.  No flowsheet data found. CHL IP DIET RECOMMENDATION 05/23/2017 SLP Diet Recommendations Dysphagia 3 (Mech soft) solids;Thin liquid;Other (small sips, chin tuck) Liquid Administration via Cup;Straw;Other (small sips/chin tuck) Medication Administration Whole meds with puree Compensations Slow rate;Small sips/bites;Multiple dry swallows after each bite/sip;Effortful swallow;Chin tuck Postural Changes Remain semi-upright after after feeds/meals (general esophageal precautions);Seated upright at 90 degrees   CHL IP OTHER RECOMMENDATIONS 05/23/2017 Recommended Consults Consider ENT evaluation;Consider esophageal assessment;Other (OP SLP for voice/dysphagia tx) Oral Care Recommendations Oral care BID Other Recommendations --   CHL IP FOLLOW UP RECOMMENDATIONS 05/23/2017 Follow up Recommendations Outpatient SLP   No flowsheet data found.     CHL IP ORAL PHASE 05/23/2017 Oral Phase Impaired Oral - Pudding Teaspoon -- Oral - Pudding Cup -- Oral - Honey Teaspoon -- Oral - Honey Cup -- Oral - Nectar Teaspoon -- Oral - Nectar Cup --  Oral - Nectar Straw -- Oral - Thin Teaspoon -- Oral - Thin Cup -- Oral - Thin Straw -- Oral - Puree -- Oral - Mech Soft Impaired mastication;Weak lingual manipulation Oral - Regular -- Oral - Multi-Consistency -- Oral - Pill -- Oral Phase - Comment --  CHL IP PHARYNGEAL PHASE 05/23/2017 Pharyngeal Phase Impaired Pharyngeal- Pudding Teaspoon -- Pharyngeal -- Pharyngeal- Pudding Cup -- Pharyngeal -- Pharyngeal- Honey Teaspoon -- Pharyngeal -- Pharyngeal- Honey Cup -- Pharyngeal -- Pharyngeal- Nectar Teaspoon -- Pharyngeal -- Pharyngeal- Nectar Cup  Delayed swallow initiation-vallecula;Reduced tongue base retraction;Penetration/Aspiration during swallow;Pharyngeal residue - posterior pharnyx;Compensatory strategies attempted (with notebox) Pharyngeal Material enters airway, remains ABOVE vocal cords then ejected out Pharyngeal- Nectar Straw Delayed swallow initiation-vallecula;Reduced tongue base retraction;Penetration/Aspiration during swallow;Trace aspiration;Pharyngeal residue - posterior pharnyx;Compensatory strategies attempted (with notebox) Pharyngeal Material enters airway, CONTACTS cords and then ejected out Pharyngeal- Thin Teaspoon -- Pharyngeal -- Pharyngeal- Thin Cup Delayed swallow initiation-vallecula;Reduced tongue base retraction;Penetration/Aspiration during swallow;Pharyngeal residue - posterior pharnyx;Compensatory strategies attempted (with notebox) Pharyngeal Material enters airway, remains ABOVE vocal cords then ejected out Pharyngeal- Thin Straw -- Pharyngeal -- Pharyngeal- Puree Delayed swallow initiation-vallecula;Reduced tongue base retraction;Pharyngeal residue - valleculae;Pharyngeal residue - posterior pharnyx;Compensatory strategies attempted (with notebox) Pharyngeal -- Pharyngeal- Mechanical Soft Delayed swallow initiation-vallecula;Reduced tongue base retraction;Pharyngeal residue - valleculae;Pharyngeal residue - posterior pharnyx;Compensatory strategies attempted (with notebox) Pharyngeal -- Pharyngeal- Regular -- Pharyngeal -- Pharyngeal- Multi-consistency -- Pharyngeal -- Pharyngeal- Pill -- Pharyngeal -- Pharyngeal Comment (No Data)  CHL IP CERVICAL ESOPHAGEAL PHASE 05/23/2017 Cervical Esophageal Phase Impaired Pudding Teaspoon -- Pudding Cup -- Honey Teaspoon -- Honey Cup -- Nectar Teaspoon -- Nectar Cup -- Nectar Straw -- Thin Teaspoon -- Thin Cup -- Thin Straw -- Puree -- Mechanical Soft -- Regular -- Multi-consistency -- Pill -- Cervical Esophageal Comment (No Data) CHL IP GO 05/23/2017 Functional Assessment Tool Used  NOMS; clinical judgment Functional Limitations Swallowing Swallow Current Status (R6045(G8996) CJ Swallow Goal Status (W0981(G8997) CJ Swallow Discharge Status (X9147(G8998) CJ Motor Speech Current Status (W2956(G8999) (None) Motor Speech Goal Status (O1308(G9186) (None) Motor Speech Goal Status (M5784(G9158) (None) Spoken Language Comprehension Current Status (O9629(G9159) (None) Spoken Language Comprehension Goal Status (B2841(G9160) (None) Spoken Language Comprehension Discharge Status (L2440(G9161) (None) Spoken Language Expression Current Status (N0272(G9162) (None) Spoken Language Expression Goal Status (Z3664(G9163) (None) Spoken Language Expression Discharge Status [phone]([phone]) (None) Attention Current Status (Q2595(G9165) (None) Attention Goal Status (G3875(G9166) (None) Attention Discharge Status (I4332(G9167) (None) Memory Current Status (R5188(G9168) (None) Memory Goal Status (C1660(G9169) (None) Memory Discharge Status (Y3016(G9170) (None) Voice Current Status (W1093(G9171) (None) Voice Goal Status (A3557(G9172) (None) Voice Discharge Status (D2202(G9173) (None) Other Speech-Language Pathology Functional Limitation Current Status (R4270(G9174) (None) Other Speech-Language Pathology Functional Limitation Goal Status (W2376(G9175) (None) Other Speech-Language Pathology Functional Limitation Discharge Status [phone]([phone]) (None) Tressie StalkerPat Adams, M.S., CCC-SLP 05/23/2017, 3:20 PM                Assessment and Recommendation  61 y.o. male chest pain with known cad on appropriate meds Patient wishes to medically manage and hterefore will stop heparin and continue medical mgt and consider cardiac cath only if sx return  Signed, Arnoldo HookerBruce Jehiel Koepp M.D. FACC

## 2017-06-12 NOTE — Progress Notes (Signed)
ANTICOAGULATION CONSULT NOTE - Initial Consult  Pharmacy Consult for heparin drip Indication: chest pain/ACS  Allergies  Allergen Reactions  . Iodinated Diagnostic Agents Nausea Only and Other (See Comments)    Reaction:  Fever   . Sulfa Antibiotics Rash    Patient Measurements: Height: 5\' 11"  (180.3 cm) Weight: 217 lb (98.4 kg) IBW/kg (Calculated) : 75.3 Heparin Dosing Weight: 95 kg  Vital Signs: Temp: 97.7 F (36.5 C) (07/11 1440) Temp Source: Oral (07/11 1440) BP: 110/60 (07/11 1440) Pulse Rate: 48 (07/11 1440)  Labs:  Recent Labs  06/12/17 0218 06/12/17 0907 06/12/17 1153 06/12/17 1504  HGB 14.9 14.8  --   --   HCT 42.6 42.7  --   --   PLT 140* 149*  --   --   APTT 30  --   --   --   LABPROT 13.7 14.0  --   --   INR 1.05 1.08  --   --   HEPARINUNFRC  --  0.61  --  0.59  CREATININE 1.11 0.98  --   --   TROPONINI <0.03 0.05* 0.04*  --     Estimated Creatinine Clearance: 93.4 mL/min (by C-G formula based on SCr of 0.98 mg/dL).   Medical History: Past Medical History:  Diagnosis Date  . Anxiety   . Coronary artery disease   . Hyperlipidemia   . Hypertension   . Parkinson's disease (HCC)   . Prediabetes     Medications:  No anticoagulation in PTA meds  Assessment:  Goal of Therapy:  Heparin level 0.3-0.7 units/ml Monitor platelets by anticoagulation protocol: Yes   Plan:  4000 units bolus and initial rate of 1300 units/hr. First heparin level 6 hours after start of infusion.  7/11 0907 HL therapeutic x 1. Continue current rate. Will recheck HL in 6 hours.   7/11 1504 HL therapeutic x 2. Continue current rate. Will recheck HL daily.   Sean Barajas, Pharm.D., BCPS Clinical Pharmacist 06/12/2017,4:24 PM

## 2017-06-12 NOTE — Progress Notes (Signed)
Pharmacist - Provider Communication  Per patient report and preference the Sinemet schedule has been updated as follows  Sinemet IR 25/100   3 tablets at 06:00  2 tablets at 10:00  3 tablets at 12:00  2 tablets at 15:00  2 tablets at 17:00  Sinemet CR 50/200  1 tablet at 21:00  Mr. Sean Barajas is aware that Neupro patch is nonformulary, not kept by the hospital, and will need to be provided by patient/family if he wishes to use it inpatient. He says he uses it only when his symptoms are bothersome and that he doesn't wish to use the patch now. He will have it brought in if he needs it.   Mr. Sean Barajas says he has tried Azilect 1 mg po daily and Mirapex 0.25 mg po TID in the past but is no longer on these medications.   [name] A. Shaferookson, VermontPharm.D., BCPS Clinical Pharmacist 06/12/2017 10:32

## 2017-06-12 NOTE — Progress Notes (Signed)
ANTICOAGULATION CONSULT NOTE - Initial Consult  Pharmacy Consult for heparin drip Indication: chest pain/ACS  Allergies  Allergen Reactions  . Iodinated Diagnostic Agents Nausea Only and Other (See Comments)    Reaction:  Fever   . Sulfa Antibiotics Rash    Patient Measurements: Weight: 217 lb (98.4 kg) Heparin Dosing Weight: 95 kg  Vital Signs: Temp: 97.7 F (36.5 C) (07/11 0323) Temp Source: Oral (07/11 0323) BP: 194/100 (07/11 0230) Pulse Rate: 66 (07/11 0230)  Labs:  Recent Labs  06/12/17 0218  HGB 14.9  HCT 42.6  PLT 140*  CREATININE 1.11  TROPONINI <0.03    Estimated Creatinine Clearance: 82.5 mL/min (by C-G formula based on SCr of 1.11 mg/dL).   Medical History: Past Medical History:  Diagnosis Date  . Anxiety   . Coronary artery disease   . Hyperlipidemia   . Hypertension   . Parkinson's disease (HCC)   . Prediabetes     Medications:  No anticoagulation in PTA meds  Assessment:  Goal of Therapy:  Heparin level 0.3-0.7 units/ml Monitor platelets by anticoagulation protocol: Yes   Plan:  4000 units bolus and initial rate of 1300 units/hr. First heparin level 6 hours after start of infusion.  Logan Vegh S 06/12/2017,4:03 AM
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
## 2017-06-12 NOTE — Plan of Care (Signed)
Problem: Pain Managment: Goal: General experience of comfort will improve Outcome: Progressing Pt has been pain free since coming up from  The ED. Will continue to monitor.  Problem: Tissue Perfusion: Goal: Risk factors for ineffective tissue perfusion will decrease Outcome: Progressing Heparin gtt.

## 2017-06-12 NOTE — ED Notes (Addendum)
Pt reports his pain has increased to a 8 on a 0-10 scale

## 2017-06-12 NOTE — ED Notes (Signed)
Relayed information about pt's decreased pain scale to 1. MD gave verbal order to d/c IV nitroglycerin.

## 2017-06-12 NOTE — ED Notes (Signed)
Pt reports pain is back. MD made aware

## 2017-06-12 NOTE — Consult Note (Signed)
[facility] Cardiology Consultation Note  Patient ID: Sean Barajas, MRN: [id number], DOB/AGE: [id number] 62 y.o. Admit date: 06/12/2017   Date of Consult: 06/12/2017 Primary Physician: Duanne Limerick, MD Primary Cardiologist: Paraschos  Chief Complaint:  Chief Complaint  Patient presents with  . Chest Pain   Reason for Consult: chest pain  HPI: 61 y.o. male with known coronary artery disease status post coronary artery bypass graft and multiple stents in the circumflex native artery and obtuse marginal with recent cardiac catheterization showing patent LIMA to the LAD as well as patent saphenous vein graft to right coronary artery. The patient has received appropriate medication management for these issues since 3 months prior with improved symptoms. Patient has recently had an acute left sided chest discomfort sharp in nature radiating into his back lasting for many hours at a time not relieved by nitroglycerin initially but then relieved completely. The patient has had an EKG which is unchanged and no evidence of myocardial infarction with a troponin peak of 0.05 and now 0.04. It is unclear whether this is due to small vessel coronary artery disease myocardial infarction and or ischemia. The patient has improved with medication management and will potentially pursue this if able  Past Medical History:  Diagnosis Date  . Anxiety   . Coronary artery disease   . Hyperlipidemia   . Hypertension   . Parkinson's disease (HCC)   . Prediabetes       Surgical History:  Past Surgical History:  Procedure Laterality Date  . APPENDECTOMY    . CARDIAC CATHETERIZATION    . CAROTID STENT  07/2015  . CHOLECYSTECTOMY    . CORONARY ANGIOPLASTY    . CORONARY ARTERY BYPASS GRAFT    . CORONARY STENT INTERVENTION N/A 01/30/2017   Procedure: Coronary Stent Intervention;  Surgeon: Marcina Millard, MD;  Location: [facility] INVASIVE CV LAB;  Service: Cardiovascular;  Laterality: N/A;  . EYE SURGERY  Bilateral   . LEFT HEART CATH AND CORS/GRAFTS ANGIOGRAPHY N/A 01/30/2017   Procedure: Left Heart Cath and Cors/Grafts Angiography and possible PCI;  Surgeon: Marcina Millard, MD;  Location: [facility] INVASIVE CV LAB;  Service: Cardiovascular;  Laterality: N/A;  . NASAL SEPTUM SURGERY    . TONSILLECTOMY       Home Meds: Prior to Admission medications   Medication Sig Start Date End Date Taking? Authorizing Provider  acetaminophen (TYLENOL) 500 MG tablet Take 1,000 mg by mouth every 6 (six) hours as needed for mild pain.   Yes [provider]  albuterol (PROVENTIL HFA;VENTOLIN HFA) 108 (90 BASE) MCG/ACT inhaler Inhale 2 puffs into the lungs every 6 (six) hours as needed for wheezing or shortness of breath.   Yes [provider]  ALPRAZolam Prudy Feeler) 0.5 MG tablet Take 0.5 mg by mouth 3 (three) times daily. Dr Malvin Johns   Yes [provider]  ARTIFICIAL TEAR OP Apply 1 drop to eye as needed (for dry eyes).   Yes [provider]  aspirin EC 81 MG tablet Take 81 mg by mouth daily.   Yes [provider]  carbidopa-levodopa (SINEMET CR) 50-200 MG tablet Take 1 tablet by mouth at bedtime. 12/28/16  Yes Tat, Octaviano Batty, DO  carbidopa-levodopa (SINEMET IR) 25-100 MG tablet Take 2 tablets by mouth 6 (six) times daily. Patient taking differently: Take 3 tablets by mouth 4 (four) times daily.  12/28/16  Yes Tat, Octaviano Batty, DO  carvedilol (COREG) 6.25 MG tablet Take 6.25 mg by mouth 2 (two) times daily.  Yes [provider]  isosorbide mononitrate (IMDUR) 30 MG 24 hr tablet Take 1 tablet (30 mg total) by mouth daily. 01/31/17  Yes Delfino Lovett, MD  lisinopril (ZESTRIL) 2.5 MG tablet Take 1 tablet (2.5 mg total) by mouth daily. 01/31/17  Yes Delfino Lovett, MD  nitroGLYCERIN (NITROSTAT) 0.4 MG SL tablet Place 0.4 mg under the tongue every 5 (five) minutes as needed for chest pain.   Yes [provider]  phenol (CHLORASEPTIC) 1.4 % LIQD Use as directed 1 spray in  the mouth or throat as needed for throat irritation / pain.   Yes [provider]  rosuvastatin (CRESTOR) 20 MG tablet Take 20 mg by mouth at bedtime.   Yes [provider]  rotigotine (NEUPRO) 2 MG/24HR Place 1 patch onto the skin daily. 05/13/17  Yes Tat, Octaviano Batty, DO  ticagrelor (BRILINTA) 90 MG TABS tablet Take 90 mg by mouth every 12 (twelve) hours.   Yes [provider]  valACYclovir (VALTREX) 500 MG tablet Take 500 mg by mouth 2 (two) times daily.   Yes [provider]  rotigotine (NEUPRO) 4 MG/24HR Place 1 patch onto the skin daily. Patient not taking: Reported on 05/13/2017 03/14/17   Tat, Octaviano Batty, DO    Inpatient Medications:  . ALPRAZolam  0.5 mg Oral TID  . aspirin EC  81 mg Oral Daily  . carbidopa-levodopa  1 tablet Oral QHS  . [START ON 06/13/2017] carbidopa-levodopa  3 tablet Oral Q0600   And  . [START ON 06/13/2017] carbidopa-levodopa  2 tablet Oral Q0600   And  . carbidopa-levodopa  3 tablet Oral Q0600   And  . carbidopa-levodopa  2 tablet Oral Q0600   And  . carbidopa-levodopa  2 tablet Oral Q0600  . carvedilol  6.25 mg Oral BID  . isosorbide mononitrate  30 mg Oral Daily  . lisinopril  2.5 mg Oral Daily  . morphine      . rosuvastatin  20 mg Oral QHS  . rotigotine  1 patch Transdermal Daily  . ticagrelor  90 mg Oral Q12H  . valACYclovir  500 mg Oral BID   . heparin 1,300 Units/hr (06/12/17 0407)  . nitroGLYCERIN      Allergies:  Allergies  Allergen Reactions  . Iodinated Diagnostic Agents Nausea Only and Other (See Comments)    Reaction:  Fever   . Sulfa Antibiotics Rash    Social History   Social History  . Marital status: Single    Spouse name: N/A  . Number of children: N/A  . Years of education: N/A   Occupational History  . self employed     toe truck driver   Social History Main Topics  . Smoking status: Never Smoker  . Smokeless tobacco: Never Used  . Alcohol use 0.0 oz/week     Comment: 1 glass  wine/month  . Drug use: Yes     Comment: hemp oil  . Sexual activity: Not on file   Other Topics Concern  . Not on file   Social History Narrative  . No narrative on file     History reviewed. No pertinent family history.   Review of Systems Positive for Chest pain Negative for: General:  chills, fever, night sweats or weight changes.  Cardiovascular: PND orthopnea syncope dizziness  Dermatological skin lesions rashes Respiratory: Cough congestion Urologic: Frequent urination urination at night and hematuria Abdominal: negative for nausea, vomiting, diarrhea, bright red blood per rectum, melena, or hematemesis Neurologic: negative for visual  changes, and/or hearing changes  All other systems reviewed and are otherwise negative except as noted above.  Labs:  Recent Labs  06/12/17 0218 06/12/17 0907 06/12/17 1153  TROPONINI <0.03 0.05* 0.04*   Lab Results  Component Value Date   WBC 4.3 06/12/2017   HGB 14.8 06/12/2017   HCT 42.7 06/12/2017   MCV 90.8 06/12/2017   PLT 149 (L) 06/12/2017    Recent Labs Lab 06/12/17 0907  NA 139  K 3.9  CL 103  CO2 27  BUN 13  CREATININE 0.98  CALCIUM 9.1  GLUCOSE 135*   Lab Results  Component Value Date   CHOL 138 06/12/2017   HDL 35 (L) 06/12/2017   LDLCALC 67 06/12/2017   TRIG 178 (H) 06/12/2017   No results found for: DDIMER  Radiology/Studies:  Dg Chest Portable 1 View  Result Date: 06/12/2017 CLINICAL DATA:  Chest pain EXAM: PORTABLE CHEST 1 VIEW COMPARISON:  01/29/2017 FINDINGS: Post sternotomy changes. Minimal atelectasis at the left base. Borderline cardiomegaly. No edema. No pneumothorax. IMPRESSION: Minimal atelectasis at the left base. Electronically Signed   By: Jasmine Pang M.D.   On: 06/12/2017 02:35   Dg Swallowing Func-speech Pathology  Result Date: 05/23/2017 Objective Swallowing Evaluation: Type of Study: MBS-Modified Barium Swallow Study Patient Details Name: BRELAN HANNEN MRN: [id number]  Date of Birth: January 29, 1956 Today's Date: 05/23/2017 Time: No Data Recorded-No Data Recorded No Data Recorded Past Medical History: Past Medical History: Diagnosis Date . Anxiety  . Coronary artery disease  . Hyperlipidemia  . Hypertension  . Parkinson's disease (HCC)  . Prediabetes  Past Surgical History: Past Surgical History: Procedure Laterality Date . APPENDECTOMY   . CARDIAC CATHETERIZATION   . CAROTID STENT  07/2015 . CHOLECYSTECTOMY   . CORONARY ANGIOPLASTY   . CORONARY ARTERY BYPASS GRAFT   . CORONARY STENT INTERVENTION N/A 01/30/2017  Procedure: Coronary Stent Intervention;  Surgeon: Marcina Millard, MD;  Location: [facility] INVASIVE CV LAB;  Service: Cardiovascular;  Laterality: N/A; . EYE SURGERY Bilateral  . LEFT HEART CATH AND CORS/GRAFTS ANGIOGRAPHY N/A 01/30/2017  Procedure: Left Heart Cath and Cors/Grafts Angiography and possible PCI;  Surgeon: Marcina Millard, MD;  Location: [facility] INVASIVE CV LAB;  Service: Cardiovascular;  Laterality: N/A; . NASAL SEPTUM SURGERY   . TONSILLECTOMY   No Data Recorded No Data Recorded Assessment / Plan / Recommendation CHL IP CLINICAL IMPRESSIONS 05/23/2017 Clinical Impression Pt exhibits mild-moderate oropharyngeal dysphagia and mild esophageal dysphagia as evidenced by minimal oral propulsion/weak lingual manipulation (solids), delay in the initiation of the swallow with all consistencies to the valleculae, trace aspiration without chin tuck technique utilized with larger boluses of nectar-thickened liquids; with utilization of chin tuck with smaller sips, trace penetration only noted without aspiration with subsequent swallows of nectar-thickened liquids and thin liquids. Decreased tongue-base retraction/pharyngeal weakness noted with mild pharyngeal residue resulting, but cleared with repetitive swallows. Possible backflow noted with last swallow of solids during MBSS, so esophageal assessment may be indicated to r/o any esophageal deficits; pt c/o recent "reflux  episodes" during intake of pmhx. Recommend Dysphagia 3 (mechanical/soft) diet with thin liquids utilizing chin tuck AT ALL TIMES during liquid intake d/t moderate risk for aspiration without use of chin tuck.   SLP Visit Diagnosis Dysphagia, oropharyngeal phase (R13.12);Dysphagia, pharyngoesophageal phase (R13.14) Attention and concentration deficit following -- Frontal lobe and executive function deficit following -- Impact on safety and function Mild aspiration risk   No flowsheet data found.  No flowsheet data found. CHL IP DIET RECOMMENDATION  05/23/2017 SLP Diet Recommendations Dysphagia 3 (Mech soft) solids;Thin liquid;Other (small sips, chin tuck) Liquid Administration via Cup;Straw;Other (small sips/chin tuck) Medication Administration Whole meds with puree Compensations Slow rate;Small sips/bites;Multiple dry swallows after each bite/sip;Effortful swallow;Chin tuck Postural Changes Remain semi-upright after after feeds/meals (general esophageal precautions);Seated upright at 90 degrees   CHL IP OTHER RECOMMENDATIONS 05/23/2017 Recommended Consults Consider ENT evaluation;Consider esophageal assessment;Other (OP SLP for voice/dysphagia tx) Oral Care Recommendations Oral care BID Other Recommendations --   CHL IP FOLLOW UP RECOMMENDATIONS 05/23/2017 Follow up Recommendations Outpatient SLP   No flowsheet data found.     CHL IP ORAL PHASE 05/23/2017 Oral Phase Impaired Oral - Pudding Teaspoon -- Oral - Pudding Cup -- Oral - Honey Teaspoon -- Oral - Honey Cup -- Oral - Nectar Teaspoon -- Oral - Nectar Cup -- Oral - Nectar Straw -- Oral - Thin Teaspoon -- Oral - Thin Cup -- Oral - Thin Straw -- Oral - Puree -- Oral - Mech Soft Impaired mastication;Weak lingual manipulation Oral - Regular -- Oral - Multi-Consistency -- Oral - Pill -- Oral Phase - Comment --  CHL IP PHARYNGEAL PHASE 05/23/2017 Pharyngeal Phase Impaired Pharyngeal- Pudding Teaspoon -- Pharyngeal -- Pharyngeal- Pudding Cup -- Pharyngeal -- Pharyngeal-  Honey Teaspoon -- Pharyngeal -- Pharyngeal- Honey Cup -- Pharyngeal -- Pharyngeal- Nectar Teaspoon -- Pharyngeal -- Pharyngeal- Nectar Cup Delayed swallow initiation-vallecula;Reduced tongue base retraction;Penetration/Aspiration during swallow;Pharyngeal residue - posterior pharnyx;Compensatory strategies attempted (with notebox) Pharyngeal Material enters airway, remains ABOVE vocal cords then ejected out Pharyngeal- Nectar Straw Delayed swallow initiation-vallecula;Reduced tongue base retraction;Penetration/Aspiration during swallow;Trace aspiration;Pharyngeal residue - posterior pharnyx;Compensatory strategies attempted (with notebox) Pharyngeal Material enters airway, CONTACTS cords and then ejected out Pharyngeal- Thin Teaspoon -- Pharyngeal -- Pharyngeal- Thin Cup Delayed swallow initiation-vallecula;Reduced tongue base retraction;Penetration/Aspiration during swallow;Pharyngeal residue - posterior pharnyx;Compensatory strategies attempted (with notebox) Pharyngeal Material enters airway, remains ABOVE vocal cords then ejected out Pharyngeal- Thin Straw -- Pharyngeal -- Pharyngeal- Puree Delayed swallow initiation-vallecula;Reduced tongue base retraction;Pharyngeal residue - valleculae;Pharyngeal residue - posterior pharnyx;Compensatory strategies attempted (with notebox) Pharyngeal -- Pharyngeal- Mechanical Soft Delayed swallow initiation-vallecula;Reduced tongue base retraction;Pharyngeal residue - valleculae;Pharyngeal residue - posterior pharnyx;Compensatory strategies attempted (with notebox) Pharyngeal -- Pharyngeal- Regular -- Pharyngeal -- Pharyngeal- Multi-consistency -- Pharyngeal -- Pharyngeal- Pill -- Pharyngeal -- Pharyngeal Comment (No Data)  CHL IP CERVICAL ESOPHAGEAL PHASE 05/23/2017 Cervical Esophageal Phase Impaired Pudding Teaspoon -- Pudding Cup -- Honey Teaspoon -- Honey Cup -- Nectar Teaspoon -- Nectar Cup -- Nectar Straw -- Thin Teaspoon -- Thin Cup -- Thin Straw -- Puree -- Mechanical  Soft -- Regular -- Multi-consistency -- Pill -- Cervical Esophageal Comment (No Data) CHL IP GO 05/23/2017 Functional Assessment Tool Used NOMS; clinical judgment Functional Limitations Swallowing Swallow Current Status (J8119(G8996) CJ Swallow Goal Status (J4782(G8997) CJ Swallow Discharge Status (N5621(G8998) CJ Motor Speech Current Status (H0865(G8999) (None) Motor Speech Goal Status (H8469(G9186) (None) Motor Speech Goal Status (G2952(G9158) (None) Spoken Language Comprehension Current Status (W4132(G9159) (None) Spoken Language Comprehension Goal Status (G4010(G9160) (None) Spoken Language Comprehension Discharge Status (U7253(G9161) (None) Spoken Language Expression Current Status (G6440(G9162) (None) Spoken Language Expression Goal Status (H4742(G9163) (None) Spoken Language Expression Discharge Status (V9563(G9164) (None) Attention Current Status (O7564(G9165) (None) Attention Goal Status (P3295(G9166) (None) Attention Discharge Status (J8841(G9167) (None) Memory Current Status (Y6063(G9168) (None) Memory Goal Status (K1601(G9169) (None) Memory Discharge Status (U9323(G9170) (None) Voice Current Status (F5732(G9171) (None) Voice Goal Status (K0254(G9172) (None) Voice Discharge Status (Y7062(G9173) (None) Other Speech-Language Pathology Functional Limitation Current Status (B7628(G9174) (None) Other Speech-Language Pathology Functional Limitation  Goal Status [phone]) (None) Other Speech-Language Pathology Functional Limitation Discharge Status [phone]) (None) Tressie Stalker, M.S., CCC-SLP 05/23/2017, 3:20 PM               EKG: Normal sinus rhythm  Weights: Filed Weights   06/12/17 0213  Weight: 98.4 kg (217 lb)     Physical Exam: Blood pressure (!) 143/82, pulse (!) 51, temperature 97.6 F (36.4 C), temperature source Oral, resp. rate 16, height 5\' 11"  (1.803 m), weight 98.4 kg (217 lb), SpO2 100 %. Body mass index is 30.27 kg/m. General: Well developed, well nourished, in no acute distress. Head eyes ears nose throat: Normocephalic, atraumatic, sclera non-icteric, no xanthomas, nares are without discharge. No apparent thyromegaly  and/or mass  Lungs: Normal respiratory effort.  no wheezes, no rales, no rhonchi.  Heart: RRR with normal S1 S2. no murmur gallop, no rub, PMI is normal size and placement, carotid upstroke normal without bruit, jugular venous pressure is normal Abdomen: Soft, non-tender, non-distended with normoactive bowel sounds. No hepatomegaly. No rebound/guarding. No obvious abdominal masses. Abdominal aorta is normal size without bruit Extremities: No edema. no cyanosis, no clubbing, no ulcers  Peripheral : 2+ bilateral upper extremity pulses, 2+ bilateral femoral pulses, 2+ bilateral dorsal pedal pulse Neuro: Alert and oriented. No facial asymmetry. No focal deficit. Moves all extremities spontaneously. Musculoskeletal: Normal muscle tone without kyphosis Psych:  Responds to questions appropriately with a normal affect.    Assessment: 61 year old male with acute chest discomfort possibly consistent with unstable angina with minimal elevation of troponin status post recent PCI and stent placement of obtuse marginal and circumflex artery with patent LIMA to the LAD and saphenous vein graft to right coronary artery. We have discussed at length possibility of cardiac catheterization as well as medical management as well as stress test. The patient wishes to pursue conservative therapy at this time if able and will therefore potentially pursue medication management today  Plan: 1. Continue heparin at this time for possible myocardial infarction for further evaluation of troponin levels and EKG 2. Continue aspirin 81 mg with brittle into for further risk reduction of in-stent thrombosis 3. Carvedilol for further treatment of coronary artery disease and potentially increasing the lisinopril dose as well as a continuation of isosorbide for further risk reduction of anginal symptoms 4. High intensity cholesterol therapy with Crestor 5. Begin ambulation in his room and alcohol and following for further significant  symptoms and further discussion of the possibility of the cardiac catheterization versus medication management  Signed, Lamar Blinks M.D. [facility] 06/12/2017, 1:28 PM

## 2017-06-12 NOTE — Progress Notes (Signed)
ANTICOAGULATION CONSULT NOTE - Initial Consult  Pharmacy Consult for heparin drip Indication: chest pain/ACS  Allergies  Allergen Reactions  . Iodinated Diagnostic Agents Nausea Only and Other (See Comments)    Reaction:  Fever   . Sulfa Antibiotics Rash    Patient Measurements: Height: 5\' 11"  (180.3 cm) Weight: 217 lb (98.4 kg) IBW/kg (Calculated) : 75.3 Heparin Dosing Weight: 95 kg  Vital Signs: Temp: 97.6 F (36.4 C) (07/11 0554) Temp Source: Oral (07/11 0554) BP: 143/82 (07/11 0554) Pulse Rate: 51 (07/11 0554)  Labs:  Recent Labs  06/12/17 0218 06/12/17 0907  HGB 14.9 14.8  HCT 42.6 42.7  PLT 140* 149*  APTT 30  --   LABPROT 13.7 14.0  INR 1.05 1.08  HEPARINUNFRC  --  0.61  CREATININE 1.11 0.98  TROPONINI <0.03 0.05*    Estimated Creatinine Clearance: 93.4 mL/min (by C-G formula based on SCr of 0.98 mg/dL).   Medical History: Past Medical History:  Diagnosis Date  . Anxiety   . Coronary artery disease   . Hyperlipidemia   . Hypertension   . Parkinson's disease (HCC)   . Prediabetes     Medications:  No anticoagulation in PTA meds  Assessment:  Goal of Therapy:  Heparin level 0.3-0.7 units/ml Monitor platelets by anticoagulation protocol: Yes   Plan:  4000 units bolus and initial rate of 1300 units/hr. First heparin level 6 hours after start of infusion.  7/11 0907 HL therapeutic x 1. Continue current rate. Will recheck HL in 6 hours.   Carola FrostNathan A Tajh Livsey, Pharm.D., BCPS Clinical Pharmacist 06/12/2017,10:44 AM

## 2017-06-12 NOTE — Progress Notes (Signed)
Troponin .05 Dr. Gwen PoundsKowalski notified.

## 2017-06-12 NOTE — Progress Notes (Signed)
Patient ambulated in the hall with assistance. No complaints of chest pain or SOB expressed. Patient stated he felt great. Will continue to monitor.

## 2017-06-12 NOTE — ED Notes (Signed)
Report to kailey, rn.  

## 2017-06-12 NOTE — ED Notes (Signed)
Pt requests to sit on side of bed. Pt reports pain is decreased when sitting on side of bed.

## 2017-06-12 NOTE — Progress Notes (Signed)
[facility] - [place] at [facility]   PATIENT NAME: Sean Barajas    MR#:  [id number]  DATE OF BIRTH:  [birth year]  SUBJECTIVE:   Given with chest discomfort. Patient is on heparin drip. He is having minimal right knee or bleeding. Denies any chest pain today. REVIEW OF SYSTEMS:   Review of Systems  Constitutional: Negative for chills, fever and weight loss.  HENT: Positive for nosebleeds. Negative for ear discharge and ear pain.   Eyes: Negative for blurred vision, pain and discharge.  Respiratory: Negative for sputum production, shortness of breath, wheezing and stridor.   Cardiovascular: Negative for chest pain, palpitations, orthopnea and PND.  Gastrointestinal: Negative for abdominal pain, diarrhea, nausea and vomiting.  Genitourinary: Negative for frequency and urgency.  Musculoskeletal: Negative for back pain and joint pain.  Neurological: Negative for sensory change, speech change, focal weakness and weakness.  Psychiatric/Behavioral: Negative for depression and hallucinations. The patient is not nervous/anxious.    Tolerating Diet:yes Tolerating PT: ambulatory  DRUG ALLERGIES:   Allergies  Allergen Reactions  . Iodinated Diagnostic Agents Nausea Only and Other (See Comments)    Reaction:  Fever   . Sulfa Antibiotics Rash    VITALS:  Blood pressure (!) 143/82, pulse (!) 51, temperature 97.6 F (36.4 C), temperature source Oral, resp. rate 16, height 5\' 11"  (1.803 m), weight 98.4 kg (217 lb), SpO2 100 %.  PHYSICAL EXAMINATION:   Physical Exam  GENERAL:  61 y.o.-year-old patient lying in the bed with no acute distress.  EYES: Pupils equal, round, reactive to light and accommodation. No scleral icterus. Extraocular muscles intact.  HEENT: Head atraumatic, normocephalic. Oropharynx and nasopharynx clear. Right nostril bleed+ NECK:  Supple, no jugular venous distention. No thyroid enlargement, no tenderness.  LUNGS: Normal breath sounds  bilaterally, no wheezing, rales, rhonchi. No use of accessory muscles of respiration.  CARDIOVASCULAR: S1, S2 normal. No murmurs, rubs, or gallops.  ABDOMEN: Soft, nontender, nondistended. Bowel sounds present. No organomegaly or mass.  EXTREMITIES: No cyanosis, clubbing or edema b/l.    NEUROLOGIC: Cranial nerves II through XII are intact. No focal Motor or sensory deficits b/l.   PSYCHIATRIC:  patient is alert and oriented x 3.  SKIN: No obvious rash, lesion, or ulcer.   LABORATORY PANEL:  CBC  Recent Labs Lab 06/12/17 0907  WBC 4.3  HGB 14.8  HCT 42.7  PLT 149*    Chemistries   Recent Labs Lab 06/12/17 0907  NA 139  K 3.9  CL 103  CO2 27  GLUCOSE 135*  BUN 13  CREATININE 0.98  CALCIUM 9.1  MG 2.2   Cardiac Enzymes  Recent Labs Lab 06/12/17 1153  TROPONINI 0.04*   RADIOLOGY:  Dg Chest Portable 1 View  Result Date: 06/12/2017 CLINICAL DATA:  Chest pain EXAM: PORTABLE CHEST 1 VIEW COMPARISON:  01/29/2017 FINDINGS: Post sternotomy changes. Minimal atelectasis at the left base. Borderline cardiomegaly. No edema. No pneumothorax. IMPRESSION: Minimal atelectasis at the left base. Electronically Signed   By: Jasmine Pang M.D.   On: 06/12/2017 02:35   ASSESSMENT AND PLAN:   61 y.o. male with a history of Coronary artery disease status post stent and CABG on Brilinta, carotid artery disease, hypertension, hyperlipidemia, Parkinson's now being admitted with:  1. Unstable angina. History of coronary artery disease  - Trend troponins 0.03--0.05 -Continue aspirin, Coreg, Imdur, lisinopril, nitroglycerin, Brilinta, Crestor - IV heparin drip per pharmacy - - Cardiology consult with Dr. Gwen Pounds. Awaiting final recommendations  2.  History of Parkinson's -Continue Sinemet, rotigotine  Case discussed with Care Management/Social Worker. Management plans discussed with the patient, family and they are in agreement.  CODE STATUS: full  DVT Prophylaxis: Heparin  drip  TOTAL TIME TAKING CARE OF THIS PATIENT: *25 minutes.  >50% time spent on counselling and coordination of care  POSSIBLE D/C IN one to 2 DAYS, DEPENDING ON CLINICAL CONDITION.  Note: This dictation was prepared with Dragon dictation along with smaller phrase technology. Any transcriptional errors that result from this process are unintentional.  Kelsee Preslar M.D on 06/12/2017 at 1:24 PM  Between 7am to 6pm - Pager - [phone]  After 6pm go to [email or web address] - password [email or web address]  [facility] [place] [facility]  Office  [phone]  CC: Primary care physician; Duanne LimerickJones, Deanna C, MD

## 2017-06-12 NOTE — H&P (Signed)
[facility] - [place] @ [facility] Admission History and Physical Tonye Royalty, D.O.  ---------------------------------------------------------------------------------------------------------------------   PATIENT NAME: Sean Barajas MR#: [id number] DATE OF BIRTH: [birth year] DATE OF ADMISSION: 06/12/2017 PRIMARY CARE PHYSICIAN: Duanne Limerick, MD  REQUESTING/REFERRING PHYSICIAN: ED Dr. Dolores Frame  CHIEF COMPLAINT: Chief Complaint  Patient presents with  . Chest Pain    HISTORY OF PRESENT ILLNESS: Prospero Mahnke is a 61 y.o. male with a known history of Coronary artery disease status post stent and CABG on Brilinta, carotid artery disease, hypertension, hyperlipidemia, Parkinson's presents to the emergency department for evaluation of chest pain.  Patient was in a usual state of health until about 2 days ago when he describes the onset of left-sided chest pressure which had been intermittent until this evening. Pain occurs at rest, is localized and is not associated with any shortness of breath, diaphoresis, nausea, palpitations..  Otherwise there has been no change in status. Patient has been taking medication as prescribed and there has been no recent change in medication or diet.  There has been no recent illness, travel or sick contacts.    Patient denies fevers/chills, weakness, dizziness, shortness of breath, N/V/C/D, abdominal pain, dysuria/frequency, changes in mental status.   EMS/ED COURSE:   Patient took 2 nitroglycerin without significant relief prior to arrival in the emergency department. In the ER.he received morphine, nitroglycerin paste followed by a nitroglycerin drip, Zofran and full dose aspirin. Medical admission was requested for further management of unstable angina   PAST MEDICAL HISTORY: Past Medical History:  Diagnosis Date  . Anxiety   . Coronary artery disease   . Hyperlipidemia   . Hypertension   . Parkinson's disease (HCC)   . Prediabetes        PAST SURGICAL HISTORY: Past Surgical History:  Procedure Laterality Date  . APPENDECTOMY    . CARDIAC CATHETERIZATION    . CAROTID STENT  07/2015  . CHOLECYSTECTOMY    . CORONARY ANGIOPLASTY    . CORONARY ARTERY BYPASS GRAFT    . CORONARY STENT INTERVENTION N/A 01/30/2017   Procedure: Coronary Stent Intervention;  Surgeon: Marcina Millard, MD;  Location: [facility] INVASIVE CV LAB;  Service: Cardiovascular;  Laterality: N/A;  . EYE SURGERY Bilateral   . LEFT HEART CATH AND CORS/GRAFTS ANGIOGRAPHY N/A 01/30/2017   Procedure: Left Heart Cath and Cors/Grafts Angiography and possible PCI;  Surgeon: Marcina Millard, MD;  Location: [facility] INVASIVE CV LAB;  Service: Cardiovascular;  Laterality: N/A;  . NASAL SEPTUM SURGERY    . TONSILLECTOMY        SOCIAL HISTORY: Social History  Substance Use Topics  . Smoking status: Never Smoker  . Smokeless tobacco: Never Used  . Alcohol use 0.0 oz/week     Comment: 1 glass wine/month   Family History   Medical History Relation Name Comments  Cancer Father    Dementia Father    Diabetes Father    High blood pressure (Hypertension) Father    Stroke Father    Cancer Mother    Coronary Artery Disease (Blocked arteries around heart) Mother    Heart disease Mother        MEDICATIONS AT HOME: Prior to Admission medications   Medication Sig Start Date End Date Taking? Authorizing Provider  acetaminophen (TYLENOL) 500 MG tablet Take 1,000 mg by mouth every 6 (six) hours as needed for mild pain.    [provider]  albuterol (PROVENTIL HFA;VENTOLIN HFA) 108 (90 BASE) MCG/ACT inhaler Inhale 2 puffs into the lungs every 6 (  six) hours as needed for wheezing or shortness of breath.    [provider]  ALPRAZolam Prudy Feeler(XANAX) 0.5 MG tablet Take 0.5 mg by mouth 3 (three) times daily. Dr Malvin JohnsPotter    [provider]  ARTIFICIAL TEAR OP Apply 1 drop to eye as needed (for dry eyes).    [provider]  aspirin  EC 81 MG tablet Take 81 mg by mouth daily.    [provider]  carbidopa-levodopa (SINEMET CR) 50-200 MG tablet Take 1 tablet by mouth at bedtime. 12/28/16   Tat, Octaviano Battyebecca S, DO  carbidopa-levodopa (SINEMET IR) 25-100 MG tablet Take 2 tablets by mouth 6 (six) times daily. Patient taking differently: Take 3 tablets by mouth 4 (four) times daily.  12/28/16   Tat, Octaviano Battyebecca S, DO  carvedilol (COREG) 6.25 MG tablet Take 6.25 mg by mouth 2 (two) times daily.     [provider]  doxycycline (VIBRA-TABS) 100 MG tablet Take 1 tablet (100 mg total) by mouth 2 (two) times daily. 05/14/17   Duanne LimerickJones, Deanna C, MD  isosorbide mononitrate (IMDUR) 30 MG 24 hr tablet Take 1 tablet (30 mg total) by mouth daily. 01/31/17   Delfino LovettShah, Vipul, MD  lisinopril (ZESTRIL) 2.5 MG tablet Take 1 tablet (2.5 mg total) by mouth daily. 01/31/17   Delfino LovettShah, Vipul, MD  nitroGLYCERIN (NITROSTAT) 0.4 MG SL tablet Place 0.4 mg under the tongue every 5 (five) minutes as needed for chest pain.    [provider]  phenol (CHLORASEPTIC) 1.4 % LIQD Use as directed 1 spray in the mouth or throat as needed for throat irritation / pain.    [provider]  rosuvastatin (CRESTOR) 20 MG tablet Take 20 mg by mouth at bedtime.    [provider]  rotigotine (NEUPRO) 2 MG/24HR Place 1 patch onto the skin daily. 05/13/17   Tat, Octaviano Battyebecca S, DO  rotigotine (NEUPRO) 4 MG/24HR Place 1 patch onto the skin daily. Patient not taking: Reported on 05/13/2017 03/14/17   Tat, Octaviano Battyebecca S, DO  ticagrelor (BRILINTA) 90 MG TABS tablet Take 90 mg by mouth every 12 (twelve) hours.    [provider]  valACYclovir (VALTREX) 500 MG tablet Take 500 mg by mouth 2 (two) times daily.    [provider]      DRUG ALLERGIES: Allergies  Allergen Reactions  . Iodinated Diagnostic Agents Nausea Only and Other (See Comments)    Reaction:  Fever   . Sulfa Antibiotics Rash     REVIEW OF SYSTEMS: CONSTITUTIONAL: No fatigue,  weakness, fever, chills, weight gain/loss, headache EYES: No blurry or double vision. ENT: No tinnitus, postnasal drip, redness or soreness of the oropharynx. RESPIRATORY: No dyspnea, cough, wheeze, hemoptysis. CARDIOVASCULAR: Positive chest pain, negative orthopnea, palpitations, syncope. GASTROINTESTINAL: No nausea, vomiting, constipation, diarrhea, abdominal pain. No hematemesis, melena or hematochezia. GENITOURINARY: No dysuria, frequency, hematuria. ENDOCRINE: No polyuria or nocturia. No heat or cold intolerance. HEMATOLOGY: No anemia, bruising, bleeding. INTEGUMENTARY: No rashes, ulcers, lesions. MUSCULOSKELETAL: No pain, arthritis, swelling, gout. NEUROLOGIC: No numbness, tingling, weakness or ataxia. No seizure-type activity. PSYCHIATRIC: No anxiety, depression, insomnia.  PHYSICAL EXAMINATION: VITAL SIGNS: Blood pressure (!) 194/100, pulse 66, temperature 97.7 F (36.5 C), temperature source Oral, resp. rate (!) 21, weight 98.4 kg (217 lb), SpO2 96 %.  GENERAL: 61 y.o.-year-old male patient, well-developed, well-nourished sitting in the chair in no acute distress.  Pleasant and cooperative.   HEENT: Head atraumatic, normocephalic. Pupils equal. Mucus membranes moist. NECK: Supple, full range of motion.  CHEST: Normal breath sounds bilaterally. No wheezing, rales, rhonchi or crackles. No use of accessory muscles of respiration.  No reproducible chest wall tenderness.  CARDIOVASCULAR: S1, S2 normal. No murmurs, rubs, or gallops appreciated. Cap refill <2 seconds. ABDOMEN: Soft, nontender, nondistended. No rebound, guarding, rigidity.  EXTREMITIES: Full range of motion. No pedal edema, cyanosis, or clubbing. NEUROLOGIC: Cranial nerves II through XII are grossly intact with no focal sensorimotor deficit. Speech is quiet and somewhat slow secondary to Parkinson's which is chronic.  PSYCHIATRIC: The patient is alert and oriented x 3. Normal affect, mood, thought content. SKIN: Warm,  dry, and intact without obvious rash, lesion, or ulcer.  LABORATORY PANEL:  CBC  Recent Labs Lab 06/12/17 0218  WBC 3.9  HGB 14.9  HCT 42.6  PLT 140*   ----------------------------------------------------------------------------------------------------------------- Chemistries  Recent Labs Lab 06/12/17 0218  NA 138  K 3.7  CL 103  CO2 28  GLUCOSE 176*  BUN 14  CREATININE 1.11  CALCIUM 9.3   ------------------------------------------------------------------------------------------------------------------ Cardiac Enzymes  Recent Labs Lab 06/12/17 0218  TROPONINI <0.03   ------------------------------------------------------------------------------------------------------------------  RADIOLOGY: Dg Chest Portable 1 View  Result Date: 06/12/2017 CLINICAL DATA:  Chest pain EXAM: PORTABLE CHEST 1 VIEW COMPARISON:  01/29/2017 FINDINGS: Post sternotomy changes. Minimal atelectasis at the left base. Borderline cardiomegaly. No edema. No pneumothorax. IMPRESSION: Minimal atelectasis at the left base. Electronically Signed   By: Jasmine Pang M.D.   On: 06/12/2017 02:35    EKG: Normal sinus rhythm 68 bpm with normal axis and nonspecific ST-T wave changes.   IMPRESSION AND PLAN:  This is a 61 y.o. male with a history of Coronary artery disease status post stent and CABG on Brilinta, carotid artery disease, hypertension, hyperlipidemia, Parkinson's now being admitted with:  1. Unstable angina. History of coronary artery disease - Admit to inpatient with telemetry monitoring. - Trend troponins, check lipids and TSH. -Continue aspirin, Coreg, Imdur, lisinopril, nitroglycerin, Brilinta, Crestor - IV heparin drip per pharmacy - Check echo - Cardiology consult requested.   2.  History of Parkinson's -Continue Sinemet, rotigotine  Admission status: Observation, telemetry Diet/Nutrition: Nothing by mouth  Fluids: HL DVT Px: Heparin,and early ambulation Code Status:  Full Disposition Plan: To home in <24 hours  All the records are reviewed and case discussed with ED provider. Management plans discussed with the patient and/or family who express understanding and agree with plan of care.   Shoichi Mielke D.O. on 06/12/2017 at 4:08 AM Between 7am to 6pm - Pager - [phone] After 6pm go to [email or web address] - Social research officer, government [facility] [place] [facility] Office [phone] CC: Primary care physician; Duanne Limerick, MD     Note: This dictation was prepared with Dragon dictation along with smaller phrase technology. Any transcriptional errors that result from this process are unintentional.

## 2017-06-13 ENCOUNTER — Other Ambulatory Visit: Payer: Self-pay

## 2017-06-13 ENCOUNTER — Encounter
Admission: EM | Disposition: A | Discharge status: Home / Self Care | Payer: Self-pay | Source: Home / Self Care | Attending: Internal Medicine

## 2017-06-13 ENCOUNTER — Ambulatory Visit: Payer: BLUE CROSS/BLUE SHIELD | Admitting: Occupational Therapy

## 2017-06-13 DIAGNOSIS — I25710 Atherosclerosis of autologous vein coronary artery bypass graft(s) with unstable angina pectoris: Secondary | ICD-10-CM

## 2017-06-13 HISTORY — PX: LEFT HEART CATH AND CORS/GRAFTS ANGIOGRAPHY: CATH118250

## 2017-06-13 HISTORY — PX: CORONARY STENT INTERVENTION: CATH118234

## 2017-06-13 LAB — CBC
HCT: 40.2 % (ref 40.0–52.0)
Hemoglobin: 14 g/dL (ref 13.0–18.0)
MCH: 31.3 pg (ref 26.0–34.0)
MCHC: 34.8 g/dL (ref 32.0–36.0)
MCV: 90 fL (ref 80.0–100.0)
PLATELETS: 141 10*3/uL — AB (ref 150–440)
RBC: 4.47 MIL/uL (ref 4.40–5.90)
RDW: 14.8 % — ABNORMAL HIGH (ref 11.5–14.5)
WBC: 3.6 10*3/uL — AB (ref 3.8–10.6)

## 2017-06-13 LAB — POCT ACTIVATED CLOTTING TIME: ACTIVATED CLOTTING TIME: 428 s

## 2017-06-13 LAB — TROPONIN I

## 2017-06-13 SURGERY — LEFT HEART CATH AND CORS/GRAFTS ANGIOGRAPHY
Anesthesia: Moderate Sedation

## 2017-06-13 MED ORDER — NITROGLYCERIN 5 MG/ML IV SOLN
INTRAVENOUS | Status: AC
  Filled 2017-06-13: qty 10

## 2017-06-13 MED ORDER — SODIUM CHLORIDE 0.9% FLUSH
3.0000 mL | INTRAVENOUS | Status: DC | PRN
Start: 2017-06-13 — End: 2017-06-13

## 2017-06-13 MED ORDER — SODIUM CHLORIDE 0.9% FLUSH
3.0000 mL | Freq: Two times a day (BID) | INTRAVENOUS | Status: DC
  Administered 2017-06-14: 3 mL via INTRAVENOUS

## 2017-06-13 MED ORDER — SODIUM CHLORIDE 0.9% FLUSH
3.0000 mL | Freq: Two times a day (BID) | INTRAVENOUS | Status: DC
  Administered 2017-06-13: 3 mL via INTRAVENOUS

## 2017-06-13 MED ORDER — BIVALIRUDIN TRIFLUOROACETATE 250 MG IV SOLR
INTRAVENOUS | Status: AC
  Filled 2017-06-13: qty 250

## 2017-06-13 MED ORDER — IOPAMIDOL (ISOVUE-300) INJECTION 61%
INTRAVENOUS | Status: DC | PRN
  Administered 2017-06-13: 75 mL via INTRA_ARTERIAL

## 2017-06-13 MED ORDER — SODIUM CHLORIDE 0.9% FLUSH: 3.0000 mL | INTRAVENOUS | Status: DC | PRN

## 2017-06-13 MED ORDER — NITROGLYCERIN 1 MG/10 ML FOR IR/CATH LAB
INTRA_ARTERIAL | Status: DC | PRN
  Administered 2017-06-13: 200 ug

## 2017-06-13 MED ORDER — MIDAZOLAM HCL 2 MG/2ML IJ SOLN
INTRAMUSCULAR | Status: DC | PRN
  Administered 2017-06-13: 1 mg via INTRAVENOUS

## 2017-06-13 MED ORDER — SODIUM CHLORIDE 0.9 % IV SOLN
INTRAVENOUS | Status: AC | PRN
  Administered 2017-06-13: 1.75 mg/kg/h via INTRAVENOUS

## 2017-06-13 MED ORDER — ASPIRIN 81 MG PO CHEW: 81.0000 mg | CHEWABLE_TABLET | ORAL | Status: DC

## 2017-06-13 MED ORDER — HEPARIN (PORCINE) IN NACL 2-0.9 UNIT/ML-% IJ SOLN
INTRAMUSCULAR | Status: AC
  Filled 2017-06-13: qty 500

## 2017-06-13 MED ORDER — SODIUM CHLORIDE 0.9 % WEIGHT BASED INFUSION: 1.0000 mL/kg/h | INTRAVENOUS | Status: AC

## 2017-06-13 MED ORDER — IOPAMIDOL (ISOVUE-300) INJECTION 61%
INTRAVENOUS | Status: DC | PRN
  Administered 2017-06-13: 100 mL via INTRA_ARTERIAL

## 2017-06-13 MED ORDER — SODIUM CHLORIDE 0.9 % IV SOLN: 250.0000 mL | INTRAVENOUS | Status: DC | PRN

## 2017-06-13 MED ORDER — SODIUM CHLORIDE 0.9 % WEIGHT BASED INFUSION
3.0000 mL/kg/h | INTRAVENOUS | Status: DC
  Administered 2017-06-13: 3 mL/kg/h via INTRAVENOUS

## 2017-06-13 MED ORDER — HEPARIN SODIUM (PORCINE) 1000 UNIT/ML IJ SOLN
INTRAMUSCULAR | Status: AC
  Filled 2017-06-13: qty 1

## 2017-06-13 MED ORDER — BIVALIRUDIN BOLUS VIA INFUSION - CUPID
INTRAVENOUS | Status: DC | PRN
  Administered 2017-06-13: 69.075 mg via INTRAVENOUS

## 2017-06-13 MED ORDER — MIDAZOLAM HCL 2 MG/2ML IJ SOLN
INTRAMUSCULAR | Status: AC
  Filled 2017-06-13: qty 2

## 2017-06-13 MED ORDER — SODIUM CHLORIDE 0.9 % WEIGHT BASED INFUSION: 1.0000 mL/kg/h | INTRAVENOUS | Status: DC

## 2017-06-13 SURGICAL SUPPLY — 16 items
BALLN TREK RX 2.5X15 (BALLOONS) ×2
BALLOON TREK RX 2.5X15 (BALLOONS) ×1 IMPLANT
CATH INFINITI 5FR ANG PIGTAIL (CATHETERS) ×2 IMPLANT
CATH INFINITI 5FR JL4 (CATHETERS) ×2 IMPLANT
CATH INFINITI JR4 5F (CATHETERS) ×2 IMPLANT
CATH VISTA GUIDE 6FR AR1 (CATHETERS) ×2 IMPLANT
DEVICE CLOSURE MYNXGRIP 6/7F (Vascular Products) ×2 IMPLANT
DEVICE INFLAT 30 PLUS (MISCELLANEOUS) ×2 IMPLANT
KIT MANI 3VAL PERCEP (MISCELLANEOUS) ×2 IMPLANT
NEEDLE PERC 18GX7CM (NEEDLE) ×2 IMPLANT
PACK CARDIAC CATH (CUSTOM PROCEDURE TRAY) ×2 IMPLANT
SHEATH AVANTI 6FR X 11CM (SHEATH) ×2 IMPLANT
SHEATH PINNACLE 5F 10CM (SHEATH) ×2 IMPLANT
STENT RESOLUTE ONYX 3.0X18 (Permanent Stent) ×2 IMPLANT
WIRE EMERALD 3MM-J .035X150CM (WIRE) ×2 IMPLANT
WIRE INTUITION PROPEL ST 180CM (WIRE) ×2 IMPLANT

## 2017-06-13 NOTE — Care Management (Signed)
Order for CM consult.  No discharge needs identified by members of the care team

## 2017-06-13 NOTE — Progress Notes (Signed)
[facility] Encounter Note  Patient: Sean Barajas / Admit Date: 06/12/2017 / Date of Encounter: 06/13/2017, 8:46 AM   Subjective: Feeling well no chest pain. Troponin with only minimal elevation without evidence of myocardial infarction. Ambulation without any chest pain with chest pain and atypical of from the beginning  Review of Systems: Positive ZOX:WRUE Negative for: Vision change, hearing change, syncope, dizziness, nausea, vomiting,diarrhea, bloody stool, stomach pain, cough, congestion, diaphoresis, urinary frequency, urinary pain,skin lesions, skin rashes Others previously listed  Objective: Telemetry: nsr Physical Exam: Blood pressure 134/75, pulse (!) 49, temperature (!) 97.4 F (36.3 C), temperature source Oral, resp. rate 18, height 5\' 11"  (1.803 m), weight 98.4 kg (217 lb), SpO2 96 %. Body mass index is 30.27 kg/m. General: Well developed, well nourished, in no acute distress. Head: Normocephalic, atraumatic, sclera non-icteric, no xanthomas, nares are without discharge. Neck: No apparent masses Lungs: Normal respirations with no wheezes, no rhonchi, no rales , no crackles   Heart: Regular rate and rhythm, normal S1 S2, no murmur, no rub, no gallop, PMI is normal size and placement, carotid upstroke normal without bruit, jugular venous pressure normal Abdomen: Soft, non-tender, non-distended with normoactive bowel sounds. No hepatosplenomegaly. Abdominal aorta is normal size without bruit Extremities: No edema, no clubbing, no cyanosis, no ulcers,  Peripheral: 2+ radial, 2+ femoral, 2+ dorsal pedal pulses Neuro: Alert and oriented. Moves all extremities spontaneously. Psych:  Responds to questions appropriately with a normal affect.   Intake/Output Summary (Last 24 hours) at 06/13/17 0846 Last data filed at 06/13/17 0321  Gross per 24 hour  Intake            386.9 ml  Output                0 ml  Net            386.9 ml    Inpatient  Medications:  . ALPRAZolam  0.5 mg Oral TID  . aspirin EC  81 mg Oral Daily  . carbidopa-levodopa  1 tablet Oral QHS  . carbidopa-levodopa  3 tablet Oral Q0600   And  . carbidopa-levodopa  2 tablet Oral Q0600   And  . carbidopa-levodopa  3 tablet Oral Q0600   And  . carbidopa-levodopa  2 tablet Oral Q0600   And  . carbidopa-levodopa  2 tablet Oral Q0600  . carvedilol  6.25 mg Oral BID  . isosorbide mononitrate  30 mg Oral Daily  . lisinopril  2.5 mg Oral Daily  . rosuvastatin  20 mg Oral QHS  . rotigotine  1 patch Transdermal Daily  . ticagrelor  90 mg Oral Q12H  . valACYclovir  500 mg Oral BID   Infusions:   Labs:  Recent Labs  06/12/17 0218 06/12/17 0907  NA 138 139  K 3.7 3.9  CL 103 103  CO2 28 27  GLUCOSE 176* 135*  BUN 14 13  CREATININE 1.11 0.98  CALCIUM 9.3 9.1  MG  --  2.2   No results for input(s): AST, ALT, ALKPHOS, BILITOT, PROT, ALBUMIN in the last 72 hours.  Recent Labs  06/12/17 0907 06/13/17 0430  WBC 4.3 3.6*  HGB 14.8 14.0  HCT 42.7 40.2  MCV 90.8 90.0  PLT 149* 141*    Recent Labs  06/12/17 0218 06/12/17 0907 06/12/17 1153 06/12/17 1754  TROPONINI <0.03 0.05* 0.04* 0.05*   Invalid input(s): POCBNP No results for input(s): HGBA1C in the last 72 hours.   Weights: [employer]  06/12/17 0213  Weight: 98.4 kg (217 lb)     Radiology/Studies:  Dg Chest Portable 1 View  Result Date: 06/12/2017 CLINICAL DATA:  Chest pain EXAM: PORTABLE CHEST 1 VIEW COMPARISON:  01/29/2017 FINDINGS: Post sternotomy changes. Minimal atelectasis at the left base. Borderline cardiomegaly. No edema. No pneumothorax. IMPRESSION: Minimal atelectasis at the left base. Electronically Signed   By: Jasmine PangKim  Fujinaga M.D.   On: 06/12/2017 02:35   Dg Swallowing Func-speech Pathology  Result Date: 05/23/2017 Objective Swallowing Evaluation: Type of Study: MBS-Modified Barium Swallow Study Patient Details Name: Sean Barajas MRN: [id number] Date of Birth:  09/26/1956 Today's Date: 05/23/2017 Time: No Data Recorded-No Data Recorded No Data Recorded Past Medical History: Past Medical History: Diagnosis Date . Anxiety  . Coronary artery disease  . Hyperlipidemia  . Hypertension  . Parkinson's disease (HCC)  . Prediabetes  Past Surgical History: Past Surgical History: Procedure Laterality Date . APPENDECTOMY   . CARDIAC CATHETERIZATION   . CAROTID STENT  07/2015 . CHOLECYSTECTOMY   . CORONARY ANGIOPLASTY   . CORONARY ARTERY BYPASS GRAFT   . CORONARY STENT INTERVENTION N/A 01/30/2017  Procedure: Coronary Stent Intervention;  Surgeon: Marcina MillardAlexander Paraschos, MD;  Location: [facility] INVASIVE CV LAB;  Service: Cardiovascular;  Laterality: N/A; . EYE SURGERY Bilateral  . LEFT HEART CATH AND CORS/GRAFTS ANGIOGRAPHY N/A 01/30/2017  Procedure: Left Heart Cath and Cors/Grafts Angiography and possible PCI;  Surgeon: Marcina MillardAlexander Paraschos, MD;  Location: [facility] INVASIVE CV LAB;  Service: Cardiovascular;  Laterality: N/A; . NASAL SEPTUM SURGERY   . TONSILLECTOMY   No Data Recorded No Data Recorded Assessment / Plan / Recommendation CHL IP CLINICAL IMPRESSIONS 05/23/2017 Clinical Impression Pt exhibits mild-moderate oropharyngeal dysphagia and mild esophageal dysphagia as evidenced by minimal oral propulsion/weak lingual manipulation (solids), delay in the initiation of the swallow with all consistencies to the valleculae, trace aspiration without chin tuck technique utilized with larger boluses of nectar-thickened liquids; with utilization of chin tuck with smaller sips, trace penetration only noted without aspiration with subsequent swallows of nectar-thickened liquids and thin liquids. Decreased tongue-base retraction/pharyngeal weakness noted with mild pharyngeal residue resulting, but cleared with repetitive swallows. Possible backflow noted with last swallow of solids during MBSS, so esophageal assessment may be indicated to r/o any esophageal deficits; pt c/o recent "reflux episodes" during  intake of pmhx. Recommend Dysphagia 3 (mechanical/soft) diet with thin liquids utilizing chin tuck AT ALL TIMES during liquid intake d/t moderate risk for aspiration without use of chin tuck.   SLP Visit Diagnosis Dysphagia, oropharyngeal phase (R13.12);Dysphagia, pharyngoesophageal phase (R13.14) Attention and concentration deficit following -- Frontal lobe and executive function deficit following -- Impact on safety and function Mild aspiration risk   No flowsheet data found.  No flowsheet data found. CHL IP DIET RECOMMENDATION 05/23/2017 SLP Diet Recommendations Dysphagia 3 (Mech soft) solids;Thin liquid;Other (small sips, chin tuck) Liquid Administration via Cup;Straw;Other (small sips/chin tuck) Medication Administration Whole meds with puree Compensations Slow rate;Small sips/bites;Multiple dry swallows after each bite/sip;Effortful swallow;Chin tuck Postural Changes Remain semi-upright after after feeds/meals (general esophageal precautions);Seated upright at 90 degrees   CHL IP OTHER RECOMMENDATIONS 05/23/2017 Recommended Consults Consider ENT evaluation;Consider esophageal assessment;Other (OP SLP for voice/dysphagia tx) Oral Care Recommendations Oral care BID Other Recommendations --   CHL IP FOLLOW UP RECOMMENDATIONS 05/23/2017 Follow up Recommendations Outpatient SLP   No flowsheet data found.     CHL IP ORAL PHASE 05/23/2017 Oral Phase Impaired Oral - Pudding Teaspoon -- Oral - Pudding Cup -- Oral - Honey Teaspoon --  Oral - Honey Cup -- Oral - Nectar Teaspoon -- Oral - Nectar Cup -- Oral - Nectar Straw -- Oral - Thin Teaspoon -- Oral - Thin Cup -- Oral - Thin Straw -- Oral - Puree -- Oral - Mech Soft Impaired mastication;Weak lingual manipulation Oral - Regular -- Oral - Multi-Consistency -- Oral - Pill -- Oral Phase - Comment --  CHL IP PHARYNGEAL PHASE 05/23/2017 Pharyngeal Phase Impaired Pharyngeal- Pudding Teaspoon -- Pharyngeal -- Pharyngeal- Pudding Cup -- Pharyngeal -- Pharyngeal- Honey Teaspoon --  Pharyngeal -- Pharyngeal- Honey Cup -- Pharyngeal -- Pharyngeal- Nectar Teaspoon -- Pharyngeal -- Pharyngeal- Nectar Cup Delayed swallow initiation-vallecula;Reduced tongue base retraction;Penetration/Aspiration during swallow;Pharyngeal residue - posterior pharnyx;Compensatory strategies attempted (with notebox) Pharyngeal Material enters airway, remains ABOVE vocal cords then ejected out Pharyngeal- Nectar Straw Delayed swallow initiation-vallecula;Reduced tongue base retraction;Penetration/Aspiration during swallow;Trace aspiration;Pharyngeal residue - posterior pharnyx;Compensatory strategies attempted (with notebox) Pharyngeal Material enters airway, CONTACTS cords and then ejected out Pharyngeal- Thin Teaspoon -- Pharyngeal -- Pharyngeal- Thin Cup Delayed swallow initiation-vallecula;Reduced tongue base retraction;Penetration/Aspiration during swallow;Pharyngeal residue - posterior pharnyx;Compensatory strategies attempted (with notebox) Pharyngeal Material enters airway, remains ABOVE vocal cords then ejected out Pharyngeal- Thin Straw -- Pharyngeal -- Pharyngeal- Puree Delayed swallow initiation-vallecula;Reduced tongue base retraction;Pharyngeal residue - valleculae;Pharyngeal residue - posterior pharnyx;Compensatory strategies attempted (with notebox) Pharyngeal -- Pharyngeal- Mechanical Soft Delayed swallow initiation-vallecula;Reduced tongue base retraction;Pharyngeal residue - valleculae;Pharyngeal residue - posterior pharnyx;Compensatory strategies attempted (with notebox) Pharyngeal -- Pharyngeal- Regular -- Pharyngeal -- Pharyngeal- Multi-consistency -- Pharyngeal -- Pharyngeal- Pill -- Pharyngeal -- Pharyngeal Comment (No Data)  CHL IP CERVICAL ESOPHAGEAL PHASE 05/23/2017 Cervical Esophageal Phase Impaired Pudding Teaspoon -- Pudding Cup -- Honey Teaspoon -- Honey Cup -- Nectar Teaspoon -- Nectar Cup -- Nectar Straw -- Thin Teaspoon -- Thin Cup -- Thin Straw -- Puree -- Mechanical Soft -- Regular --  Multi-consistency -- Pill -- Cervical Esophageal Comment (No Data) CHL IP GO 05/23/2017 Functional Assessment Tool Used NOMS; clinical judgment Functional Limitations Swallowing Swallow Current Status (Z3086) CJ Swallow Goal Status (V7846) CJ Swallow Discharge Status (N6295) CJ Motor Speech Current Status (M8413) (None) Motor Speech Goal Status (K4401) (None) Motor Speech Goal Status (U2725) (None) Spoken Language Comprehension Current Status (D6644) (None) Spoken Language Comprehension Goal Status (I3474) (None) Spoken Language Comprehension Discharge Status (Q5956) (None) Spoken Language Expression Current Status (L8756) (None) Spoken Language Expression Goal Status (E3329) (None) Spoken Language Expression Discharge Status [phone]) (None) Attention Current Status (Z6606) (None) Attention Goal Status (T0160) (None) Attention Discharge Status (F0932) (None) Memory Current Status (T5573) (None) Memory Goal Status (U2025) (None) Memory Discharge Status (K2706) (None) Voice Current Status (C3762) (None) Voice Goal Status (G3151) (None) Voice Discharge Status (V6160) (None) Other Speech-Language Pathology Functional Limitation Current Status (V3710) (None) Other Speech-Language Pathology Functional Limitation Goal Status (G2694) (None) Other Speech-Language Pathology Functional Limitation Discharge Status [phone]) (None) Tressie Stalker, M.S., CCC-SLP 05/23/2017, 3:20 PM                Assessment and Recommendation  61 y.o. male chest pain with known cad on appropriate medsWithout evidence of myocardial infarction at this time with full resolution of chest discomfort with ambulation and appropriate medication management. We fully discuss further medication management of her further intervention for which the patient wishes to continue to pursue medication management 1. Continue ambulation following for improvements of symptoms and further adjustments of medication 2. Possible discharged home in this patient feeling well  with follow-up next week  Signed, Arnoldo Hooker M.D. FACC

## 2017-06-13 NOTE — Discharge Summary (Addendum)
[facility] - [place] [facility]   PATIENT NAME: Sean Barajas    MR#:  [id number]  DATE OF BIRTH:  [birth year]  DATE OF ADMISSION:  06/12/2017 ADMITTING PHYSICIAN: Tonye Royalty, DO  DATE OF DISCHARGE: 06/13/17  PRIMARY CARE PHYSICIAN: Duanne Limerick, MD    ADMISSION DIAGNOSIS:  Unstable angina (HCC) [I20.0] Essential hypertension [I10] Chest pain, unspecified type [R07.9]  DISCHARGE DIAGNOSIS:  Unstable angina CAD  SECONDARY DIAGNOSIS:   Past Medical History:  Diagnosis Date  . Anxiety   . Coronary artery disease   . Hyperlipidemia   . Hypertension   . Parkinson's disease (HCC)   . Prediabetes     HOSPITAL COURSE:   61 y.o.malewith a history of Coronary artery disease status post stent and CABG on Brilinta, carotid artery disease, hypertension, hyperlipidemia, Parkinson'snow being admitted with:  1. Unstable angina. History of coronary artery disease  - Trend troponins 0.03--0.05--0.05 -pt aymptomatic -Continue aspirin, Coreg, Imdur, lisinopril, nitroglycerin, Brilinta, Crestor - IV heparin drip per pharmacy---now d/ced - - Cardiology consult with Dr. Gwen Pounds. Medical management.  2. History of Parkinson's -Continue Sinemet, rotigotine  Overall feels back to baseline. Pt would like to go home. Paged Dr Gwen Pounds to inform.  CONSULTS OBTAINED:  Treatment Team:  Lamar Blinks, MD  DRUG ALLERGIES:   Allergies  Allergen Reactions  . Iodinated Diagnostic Agents Nausea Only and Other (See Comments)    Reaction:  Fever   . Sulfa Antibiotics Rash    DISCHARGE MEDICATIONS:   Current Discharge Medication List    CONTINUE these medications which have NOT CHANGED   Details  acetaminophen (TYLENOL) 500 MG tablet Take 1,000 mg by mouth every 6 (six) hours as needed for mild pain.    albuterol (PROVENTIL HFA;VENTOLIN HFA) 108 (90 BASE) MCG/ACT inhaler Inhale 2 puffs into the lungs every 6 (six) hours as needed  for wheezing or shortness of breath.    ALPRAZolam (XANAX) 0.5 MG tablet Take 0.5 mg by mouth 3 (three) times daily. Dr Malvin Johns    ARTIFICIAL TEAR OP Apply 1 drop to eye as needed (for dry eyes).    aspirin EC 81 MG tablet Take 81 mg by mouth daily.    carbidopa-levodopa (SINEMET CR) 50-200 MG tablet Take 1 tablet by mouth at bedtime. Qty: 90 tablet, Refills: 1    carbidopa-levodopa (SINEMET IR) 25-100 MG tablet Take 2 tablets by mouth 6 (six) times daily. Qty: 1080 tablet, Refills: 1    carvedilol (COREG) 6.25 MG tablet Take 6.25 mg by mouth 2 (two) times daily.     isosorbide mononitrate (IMDUR) 30 MG 24 hr tablet Take 1 tablet (30 mg total) by mouth daily. Qty: 30 tablet, Refills: 0    lisinopril (ZESTRIL) 2.5 MG tablet Take 1 tablet (2.5 mg total) by mouth daily. Qty: 30 tablet, Refills: 0    nitroGLYCERIN (NITROSTAT) 0.4 MG SL tablet Place 0.4 mg under the tongue every 5 (five) minutes as needed for chest pain.    phenol (CHLORASEPTIC) 1.4 % LIQD Use as directed 1 spray in the mouth or throat as needed for throat irritation / pain.    rosuvastatin (CRESTOR) 20 MG tablet Take 20 mg by mouth at bedtime.    rotigotine (NEUPRO) 2 MG/24HR Place 1 patch onto the skin daily. Qty: 7 patch, Refills: 0    ticagrelor (BRILINTA) 90 MG TABS tablet Take 90 mg by mouth every 12 (twelve) hours.    valACYclovir (VALTREX) 500 MG tablet Take 500 mg by  mouth 2 (two) times daily.    rotigotine (NEUPRO) 4 MG/24HR Place 1 patch onto the skin daily. Qty: 90 patch, Refills: 1        If you experience worsening of your admission symptoms, develop shortness of breath, life threatening emergency, suicidal or homicidal thoughts you must seek medical attention immediately by calling 911 or calling your MD immediately  if symptoms less severe.  You Must read complete instructions/literature along with all the possible adverse reactions/side effects for all the Medicines you take and that have been  prescribed to you. Take any new Medicines after you have completely understood and accept all the possible adverse reactions/side effects.   Please note  You were cared for by a hospitalist during your hospital stay. If you have any questions about your discharge medications or the care you received while you were in the hospital after you are discharged, you can call the unit and asked to speak with the hospitalist on call if the hospitalist that took care of you is not available. Once you are discharged, your primary care physician will handle any further medical issues. Please note that NO REFILLS for any discharge medications will be authorized once you are discharged, as it is imperative that you return to your primary care physician (or establish a relationship with a primary care physician if you do not have one) for your aftercare needs so that they can reassess your need for medications and monitor your lab values. Today   SUBJECTIVE   Feels a lot better. No CP  VITAL SIGNS:  Blood pressure 134/75, pulse (!) 49, temperature (!) 97.4 F (36.3 C), temperature source Oral, resp. rate 18, height 5\' 11"  (1.803 m), weight 98.4 kg (217 lb), SpO2 96 %.  I/O:   Intake/Output Summary (Last 24 hours) at 06/13/17 0809 Last data filed at 06/13/17 0321  Gross per 24 hour  Intake            386.9 ml  Output                0 ml  Net            386.9 ml    PHYSICAL EXAMINATION:  GENERAL:  61 y.o.-year-old patient lying in the bed with no acute distress.  EYES: Pupils equal, round, reactive to light and accommodation. No scleral icterus. Extraocular muscles intact.  HEENT: Head atraumatic, normocephalic. Oropharynx and nasopharynx clear.  NECK:  Supple, no jugular venous distention. No thyroid enlargement, no tenderness.  LUNGS: Normal breath sounds bilaterally, no wheezing, rales,rhonchi or crepitation. No use of accessory muscles of respiration.  CARDIOVASCULAR: S1, S2 normal. No murmurs,  rubs, or gallops.  ABDOMEN: Soft, non-tender, non-distended. Bowel sounds present. No organomegaly or mass.  EXTREMITIES: No pedal edema, cyanosis, or clubbing.  NEUROLOGIC: Cranial nerves II through XII are intact. Muscle strength 5/5 in all extremities. Sensation intact. Gait not checked.  PSYCHIATRIC: The patient is alert and oriented x 3.  SKIN: No obvious rash, lesion, or ulcer.   DATA REVIEW:   CBC   Recent Labs Lab 06/13/17 0430  WBC 3.6*  HGB 14.0  HCT 40.2  PLT 141*    Chemistries   Recent Labs Lab 06/12/17 0907  NA 139  K 3.9  CL 103  CO2 27  GLUCOSE 135*  BUN 13  CREATININE 0.98  CALCIUM 9.1  MG 2.2    Microbiology Results   No results found for this or any previous visit (from the past  240 hour(s)).  RADIOLOGY:  Dg Chest Portable 1 View  Result Date: 06/12/2017 CLINICAL DATA:  Chest pain EXAM: PORTABLE CHEST 1 VIEW COMPARISON:  01/29/2017 FINDINGS: Post sternotomy changes. Minimal atelectasis at the left base. Borderline cardiomegaly. No edema. No pneumothorax. IMPRESSION: Minimal atelectasis at the left base. Electronically Signed   By: Jasmine Pang M.D.   On: 06/12/2017 02:35     Management plans discussed with the patient, family and they are in agreement.  CODE STATUS:     Code Status Orders        Start     Ordered   06/12/17 0559  Full code  Continuous     06/12/17 0558    Code Status History    Date Active Date Inactive Code Status Order ID Comments User Context   01/29/2017 10:56 AM 01/31/2017  2:46 PM Full Code [id number]  Ramonita Lab, MD Inpatient    Advance Directive Documentation     Most Recent Value  Type of Advance Directive  Healthcare Power of Attorney  Pre-existing out of facility DNR order (yellow form or pink MOST form)  -  "MOST" Form in Place?  -      TOTAL TIME TAKING CARE OF THIS PATIENT: 40 minutes.    Shamaria Kavan M.D on 06/13/2017 at 8:09 AM  Between 7am to 6pm - Pager - [phone] After 6pm go to  [email or web address] - password [email or web address]  [facility]  Office  [phone]  CC: Primary care physician; Duanne Limerick, MD

## 2017-06-13 NOTE — Progress Notes (Signed)
[facility] Encounter Note  Patient: Sean Barajas / Admit Date: 06/12/2017 / Date of Encounter: 06/13/2017, 4:34 PM   Subjective:  Patient had significant recurrent chest discomfort exactly as he had prior to admission to the hospital. With this chest discomfort it was not relieved by any medication management other than rest.  Cardiac catheterization shows occluded left anterior descending artery right coronary artery and graft to obtuse marginal artery. There was patent LIMA to the LAD and patent new stents of left circumflex and obtuse marginal. There was a significant distal graft stenosis to PDA which was new and likely causing current symptoms  Review of Systems: Positive for: Chest pain Negative for: Vision change, hearing change, syncope, dizziness, nausea, vomiting,diarrhea, bloody stool, stomach pain, cough, congestion, diaphoresis, urinary frequency, urinary pain,skin lesions, skin rashes Others previously listed  Objective: Telemetry: nsr Physical Exam: Blood pressure 116/75, pulse (!) 52, temperature 97.6 F (36.4 C), temperature source Oral, resp. rate 16, height 5\' 11"  (1.803 m), weight 92.1 kg (203 lb), SpO2 95 %. Body mass index is 28.31 kg/m. General: Well developed, well nourished, in no acute distress. Head: Normocephalic, atraumatic, sclera non-icteric, no xanthomas, nares are without discharge. Neck: No apparent masses Lungs: Normal respirations with no wheezes, no rhonchi, no rales , no crackles   Heart: Regular rate and rhythm, normal S1 S2, no murmur, no rub, no gallop, PMI is normal size and placement, carotid upstroke normal without bruit, jugular venous pressure normal Abdomen: Soft, non-tender, non-distended with normoactive bowel sounds. No hepatosplenomegaly. Abdominal aorta is normal size without bruit Extremities: No edema, no clubbing, no cyanosis, no ulcers,  Peripheral: 2+ radial, 2+ femoral, 2+ dorsal pedal pulses Neuro: Alert  and oriented. Moves all extremities spontaneously. Psych:  Responds to questions appropriately with a normal affect.   Intake/Output Summary (Last 24 hours) at 06/13/17 1634 Last data filed at 06/13/17 1004  Gross per 24 hour  Intake            626.9 ml  Output                0 ml  Net            626.9 ml    Inpatient Medications:  . [MAR Hold] ALPRAZolam  0.5 mg Oral TID  . [START ON 06/14/2017] aspirin  81 mg Oral Pre-Cath  . [MAR Hold] aspirin EC  81 mg Oral Daily  . [MAR Hold] carbidopa-levodopa  1 tablet Oral QHS  . [MAR Hold] carbidopa-levodopa  3 tablet Oral Q0600   And  . [MAR Hold] carbidopa-levodopa  2 tablet Oral Q0600   And  . [MAR Hold] carbidopa-levodopa  3 tablet Oral Q0600   And  . [MAR Hold] carbidopa-levodopa  2 tablet Oral Q0600   And  . [MAR Hold] carbidopa-levodopa  2 tablet Oral Q0600  . [MAR Hold] carvedilol  6.25 mg Oral BID  . [MAR Hold] isosorbide mononitrate  30 mg Oral Daily  . [MAR Hold] lisinopril  2.5 mg Oral Daily  . [MAR Hold] rosuvastatin  20 mg Oral QHS  . [MAR Hold] rotigotine  1 patch Transdermal Daily  . sodium chloride flush  3 mL Intravenous Q12H  . [MAR Hold] ticagrelor  90 mg Oral Q12H  . [MAR Hold] valACYclovir  500 mg Oral BID   Infusions:  . sodium chloride    . [START ON 06/14/2017] sodium chloride 3 mL/kg/hr (06/13/17 1552)   Followed by  . [START ON 06/14/2017] sodium chloride  Labs:  Recent Labs  06/12/17 0218 06/12/17 0907  NA 138 139  K 3.7 3.9  CL 103 103  CO2 28 27  GLUCOSE 176* 135*  BUN 14 13  CREATININE 1.11 0.98  CALCIUM 9.3 9.1  MG  --  2.2   No results for input(s): AST, ALT, ALKPHOS, BILITOT, PROT, ALBUMIN in the last 72 hours.  Recent Labs  06/12/17 0907 06/13/17 0430  WBC 4.3 3.6*  HGB 14.8 14.0  HCT 42.7 40.2  MCV 90.8 90.0  PLT 149* 141*    Recent Labs  06/12/17 0907 06/12/17 1153 06/12/17 1754 06/13/17 1219  TROPONINI 0.05* 0.04* 0.05* <0.03   Invalid input(s): POCBNP No  results for input(s): HGBA1C in the last 72 hours.   Weights: Filed Weights   06/12/17 0213 06/13/17 1544  Weight: 98.4 kg (217 lb) 92.1 kg (203 lb)     Radiology/Studies:  Dg Chest Portable 1 View  Result Date: 06/12/2017 CLINICAL DATA:  Chest pain EXAM: PORTABLE CHEST 1 VIEW COMPARISON:  01/29/2017 FINDINGS: Post sternotomy changes. Minimal atelectasis at the left base. Borderline cardiomegaly. No edema. No pneumothorax. IMPRESSION: Minimal atelectasis at the left base. Electronically Signed   By: Jasmine Pang M.D.   On: 06/12/2017 02:35   Dg Swallowing Func-speech Pathology  Result Date: 05/23/2017 Objective Swallowing Evaluation: Type of Study: MBS-Modified Barium Swallow Study Patient Details Name: ESMERALDA Barajas MRN: [id number] Date of Birth: 1956/07/04 Today's Date: 05/23/2017 Time: No Data Recorded-No Data Recorded No Data Recorded Past Medical History: Past Medical History: Diagnosis Date . Anxiety  . Coronary artery disease  . Hyperlipidemia  . Hypertension  . Parkinson's disease (HCC)  . Prediabetes  Past Surgical History: Past Surgical History: Procedure Laterality Date . APPENDECTOMY   . CARDIAC CATHETERIZATION   . CAROTID STENT  07/2015 . CHOLECYSTECTOMY   . CORONARY ANGIOPLASTY   . CORONARY ARTERY BYPASS GRAFT   . CORONARY STENT INTERVENTION N/A 01/30/2017  Procedure: Coronary Stent Intervention;  Surgeon: Marcina Millard, MD;  Location: [facility] INVASIVE CV LAB;  Service: Cardiovascular;  Laterality: N/A; . EYE SURGERY Bilateral  . LEFT HEART CATH AND CORS/GRAFTS ANGIOGRAPHY N/A 01/30/2017  Procedure: Left Heart Cath and Cors/Grafts Angiography and possible PCI;  Surgeon: Marcina Millard, MD;  Location: [facility] INVASIVE CV LAB;  Service: Cardiovascular;  Laterality: N/A; . NASAL SEPTUM SURGERY   . TONSILLECTOMY   No Data Recorded No Data Recorded Assessment / Plan / Recommendation CHL IP CLINICAL IMPRESSIONS 05/23/2017 Clinical Impression Pt exhibits mild-moderate oropharyngeal  dysphagia and mild esophageal dysphagia as evidenced by minimal oral propulsion/weak lingual manipulation (solids), delay in the initiation of the swallow with all consistencies to the valleculae, trace aspiration without chin tuck technique utilized with larger boluses of nectar-thickened liquids; with utilization of chin tuck with smaller sips, trace penetration only noted without aspiration with subsequent swallows of nectar-thickened liquids and thin liquids. Decreased tongue-base retraction/pharyngeal weakness noted with mild pharyngeal residue resulting, but cleared with repetitive swallows. Possible backflow noted with last swallow of solids during MBSS, so esophageal assessment may be indicated to r/o any esophageal deficits; pt c/o recent "reflux episodes" during intake of pmhx. Recommend Dysphagia 3 (mechanical/soft) diet with thin liquids utilizing chin tuck AT ALL TIMES during liquid intake d/t moderate risk for aspiration without use of chin tuck.   SLP Visit Diagnosis Dysphagia, oropharyngeal phase (R13.12);Dysphagia, pharyngoesophageal phase (R13.14) Attention and concentration deficit following -- Frontal lobe and executive function deficit following -- Impact on safety and function Mild aspiration risk  No flowsheet data found.  No flowsheet data found. CHL IP DIET RECOMMENDATION 05/23/2017 SLP Diet Recommendations Dysphagia 3 (Mech soft) solids;Thin liquid;Other (small sips, chin tuck) Liquid Administration via Cup;Straw;Other (small sips/chin tuck) Medication Administration Whole meds with puree Compensations Slow rate;Small sips/bites;Multiple dry swallows after each bite/sip;Effortful swallow;Chin tuck Postural Changes Remain semi-upright after after feeds/meals (general esophageal precautions);Seated upright at 90 degrees   CHL IP OTHER RECOMMENDATIONS 05/23/2017 Recommended Consults Consider ENT evaluation;Consider esophageal assessment;Other (OP SLP for voice/dysphagia tx) Oral Care  Recommendations Oral care BID Other Recommendations --   CHL IP FOLLOW UP RECOMMENDATIONS 05/23/2017 Follow up Recommendations Outpatient SLP   No flowsheet data found.     CHL IP ORAL PHASE 05/23/2017 Oral Phase Impaired Oral - Pudding Teaspoon -- Oral - Pudding Cup -- Oral - Honey Teaspoon -- Oral - Honey Cup -- Oral - Nectar Teaspoon -- Oral - Nectar Cup -- Oral - Nectar Straw -- Oral - Thin Teaspoon -- Oral - Thin Cup -- Oral - Thin Straw -- Oral - Puree -- Oral - Mech Soft Impaired mastication;Weak lingual manipulation Oral - Regular -- Oral - Multi-Consistency -- Oral - Pill -- Oral Phase - Comment --  CHL IP PHARYNGEAL PHASE 05/23/2017 Pharyngeal Phase Impaired Pharyngeal- Pudding Teaspoon -- Pharyngeal -- Pharyngeal- Pudding Cup -- Pharyngeal -- Pharyngeal- Honey Teaspoon -- Pharyngeal -- Pharyngeal- Honey Cup -- Pharyngeal -- Pharyngeal- Nectar Teaspoon -- Pharyngeal -- Pharyngeal- Nectar Cup Delayed swallow initiation-vallecula;Reduced tongue base retraction;Penetration/Aspiration during swallow;Pharyngeal residue - posterior pharnyx;Compensatory strategies attempted (with notebox) Pharyngeal Material enters airway, remains ABOVE vocal cords then ejected out Pharyngeal- Nectar Straw Delayed swallow initiation-vallecula;Reduced tongue base retraction;Penetration/Aspiration during swallow;Trace aspiration;Pharyngeal residue - posterior pharnyx;Compensatory strategies attempted (with notebox) Pharyngeal Material enters airway, CONTACTS cords and then ejected out Pharyngeal- Thin Teaspoon -- Pharyngeal -- Pharyngeal- Thin Cup Delayed swallow initiation-vallecula;Reduced tongue base retraction;Penetration/Aspiration during swallow;Pharyngeal residue - posterior pharnyx;Compensatory strategies attempted (with notebox) Pharyngeal Material enters airway, remains ABOVE vocal cords then ejected out Pharyngeal- Thin Straw -- Pharyngeal -- Pharyngeal- Puree Delayed swallow initiation-vallecula;Reduced tongue base  retraction;Pharyngeal residue - valleculae;Pharyngeal residue - posterior pharnyx;Compensatory strategies attempted (with notebox) Pharyngeal -- Pharyngeal- Mechanical Soft Delayed swallow initiation-vallecula;Reduced tongue base retraction;Pharyngeal residue - valleculae;Pharyngeal residue - posterior pharnyx;Compensatory strategies attempted (with notebox) Pharyngeal -- Pharyngeal- Regular -- Pharyngeal -- Pharyngeal- Multi-consistency -- Pharyngeal -- Pharyngeal- Pill -- Pharyngeal -- Pharyngeal Comment (No Data)  CHL IP CERVICAL ESOPHAGEAL PHASE 05/23/2017 Cervical Esophageal Phase Impaired Pudding Teaspoon -- Pudding Cup -- Honey Teaspoon -- Honey Cup -- Nectar Teaspoon -- Nectar Cup -- Nectar Straw -- Thin Teaspoon -- Thin Cup -- Thin Straw -- Puree -- Mechanical Soft -- Regular -- Multi-consistency -- Pill -- Cervical Esophageal Comment (No Data) CHL IP GO 05/23/2017 Functional Assessment Tool Used NOMS; clinical judgment Functional Limitations Swallowing Swallow Current Status (W0981(G8996) CJ Swallow Goal Status (X9147(G8997) CJ Swallow Discharge Status (W2956(G8998) CJ Motor Speech Current Status (O1308(G8999) (None) Motor Speech Goal Status (M5784(G9186) (None) Motor Speech Goal Status (O9629(G9158) (None) Spoken Language Comprehension Current Status (B2841(G9159) (None) Spoken Language Comprehension Goal Status (L2440(G9160) (None) Spoken Language Comprehension Discharge Status (N0272(G9161) (None) Spoken Language Expression Current Status (Z3664(G9162) (None) Spoken Language Expression Goal Status (Q0347(G9163) (None) Spoken Language Expression Discharge Status (Q2595(G9164) (None) Attention Current Status (G3875(G9165) (None) Attention Goal Status (I4332(G9166) (None) Attention Discharge Status (R5188(G9167) (None) Memory Current Status (C1660(G9168) (None) Memory Goal Status (Y3016(G9169) (None) Memory Discharge Status (W1093(G9170) (None) Voice Current Status (A3557(G9171) (None) Voice Goal Status (D2202(G9172) (None) Voice Discharge Status (R4270(G9173) (None) Other  Speech-Language Pathology Functional Limitation Current  Status [phone]) (None) Other Speech-Language Pathology Functional Limitation Goal Status [phone]) (None) Other Speech-Language Pathology Functional Limitation Discharge Status [phone]) (None) Tressie Stalker, M.S., CCC-SLP 05/23/2017, 3:20 PM                Assessment and Recommendation  61 y.o. male chest pain with known cad on appropriate meds for which showed a medication management was tried and the patient had recurrent chest discomfort consistent with unstable angina with cardiac catheterization findings of new distal graft to right coronary artery stenosis needing further intervention 1. PCI and stent placement of distal graft stenosis of graft to right coronary artery 2. Continue dual antiplatelet therapy including Brolin to and aspirin 3. Continue high intensity cholesterol therapy with current medical regimen without change 4. ACE inhibitor beta blocker and isosorbide for coronary artery disease and angina 5. Cardiac rehabilitation has been discussed and we will be proceeding  Signed, Arnoldo Hooker M.D. FACC

## 2017-06-13 NOTE — Progress Notes (Signed)
Patient's discharge was held. Given his intermittent chest pain and cardiology is considering to do a heart catheterization tomorrow. Nothing by mouth after midnight.

## 2017-06-13 NOTE — Progress Notes (Signed)
Called by RN patient was done for those treadmill stress test. He felt clammy and was uneasy. He was sent back to the room. Dr. Gwen PoundsKowalski is aware Check stat troponin

## 2017-06-14 ENCOUNTER — Encounter: Payer: Self-pay | Admitting: Internal Medicine

## 2017-06-14 LAB — CBC
HEMATOCRIT: 40.3 % (ref 40.0–52.0)
HEMOGLOBIN: 14.3 g/dL (ref 13.0–18.0)
MCH: 31.7 pg (ref 26.0–34.0)
MCHC: 35.4 g/dL (ref 32.0–36.0)
MCV: 89.4 fL (ref 80.0–100.0)
Platelets: 132 10*3/uL — ABNORMAL LOW (ref 150–440)
RBC: 4.51 MIL/uL (ref 4.40–5.90)
RDW: 14.5 % (ref 11.5–14.5)
WBC: 4.2 10*3/uL (ref 3.8–10.6)

## 2017-06-14 LAB — BASIC METABOLIC PANEL
ANION GAP: 5 (ref 5–15)
BUN: 11 mg/dL (ref 6–20)
CALCIUM: 9.1 mg/dL (ref 8.9–10.3)
CO2: 26 mmol/L (ref 22–32)
Chloride: 108 mmol/L (ref 101–111)
Creatinine, Ser: 0.85 mg/dL (ref 0.61–1.24)
Glucose, Bld: 128 mg/dL — ABNORMAL HIGH (ref 65–99)
POTASSIUM: 3.7 mmol/L (ref 3.5–5.1)
Sodium: 139 mmol/L (ref 135–145)

## 2017-06-14 MED ORDER — CALCIUM CARBONATE ANTACID 500 MG PO CHEW
1.0000 | CHEWABLE_TABLET | Freq: Once | ORAL | Status: AC
  Administered 2017-06-14: 200 mg via ORAL
  Filled 2017-06-14: qty 1

## 2017-06-14 NOTE — Care Management (Signed)
No discharge needs identified by members of the care team.  Patient Sean NobleBrilinta is chronic.

## 2017-06-14 NOTE — Progress Notes (Signed)
IVs and tele removed from patient. Discharge instructions given to patient. Patient verbalized understanding. No distress at this time. Friend will be transporting patient home.

## 2017-06-14 NOTE — Discharge Summary (Signed)
[facility] [place] [facility]   PATIENT NAME: Sean McalpineRobert Barajas    MR#:  [id number]  DATE OF BIRTH:  05/21/1956  DATE OF ADMISSION:  06/12/2017 ADMITTING PHYSICIAN: Tonye RoyaltyAlexis Hugelmeyer, DO  DATE OF DISCHARGE: 06/14/17  PRIMARY CARE PHYSICIAN: Duanne LimerickJones, Deanna C, MD    ADMISSION DIAGNOSIS:  Unstable angina (HCC) [I20.0] Essential hypertension [I10] Chest pain, unspecified type [R07.9]  DISCHARGE DIAGNOSIS:  *Acute NSTEMI/Unstable angina s/p cath with PCI and stent placement of distal graft stenosis of graft to right coronary artery *CAD *HTN SECONDARY DIAGNOSIS:   Past Medical History:  Diagnosis Date  . Anxiety   . Coronary artery disease   . Hyperlipidemia   . Hypertension   . Parkinson's disease (HCC)   . Prediabetes     HOSPITAL COURSE:   61 y.o.malewith a history of Coronary artery disease status post stent and CABG on Brilinta, carotid artery disease, hypertension, hyperlipidemia, Parkinson'snow being admitted with:  1.Acute NSTEMI/ Unstable angina with History of coronary artery disease - Trend troponins 0.03--0.05--0.05 -pt aymptomatic -Continue aspirin, Coreg, Imdur, lisinopril, nitroglycerin, Brilinta, Crestor - IV heparin drip per pharmacy---now d/ced - - Cardiology consult with Dr. Gwen PoundsKowalski. Pt underwent cath on 06/13/17 Cardiac catheterization shows occluded left anterior descending artery right coronary artery and graft to obtuse marginal artery. There was patent LIMA to the LAD and patent new stents of left circumflex and obtuse marginal. There was a significant distal graft stenosis to PDA which was new and likely causing current symptoms .  2. History of Parkinson's -Continue Sinemet, rotigotine  Overall feels back to baseline. Pt would like to go home. Will ambulate after breakfast and d/c home if remains stable.  CONSULTS OBTAINED:  Treatment Team:  Lamar BlinksKowalski, Bruce J, MD  DRUG ALLERGIES:   Allergies  Allergen  Reactions  . Iodinated Diagnostic Agents Nausea Only and Other (See Comments)    Reaction:  Fever   . Sulfa Antibiotics Rash    DISCHARGE MEDICATIONS:   Current Discharge Medication List    CONTINUE these medications which have NOT CHANGED   Details  acetaminophen (TYLENOL) 500 MG tablet Take 1,000 mg by mouth every 6 (six) hours as needed for mild pain.    albuterol (PROVENTIL HFA;VENTOLIN HFA) 108 (90 BASE) MCG/ACT inhaler Inhale 2 puffs into the lungs every 6 (six) hours as needed for wheezing or shortness of breath.    ALPRAZolam (XANAX) 0.5 MG tablet Take 0.5 mg by mouth 3 (three) times daily. Dr Malvin JohnsPotter    ARTIFICIAL TEAR OP Apply 1 drop to eye as needed (for dry eyes).    aspirin EC 81 MG tablet Take 81 mg by mouth daily.    carbidopa-levodopa (SINEMET CR) 50-200 MG tablet Take 1 tablet by mouth at bedtime. Qty: 90 tablet, Refills: 1    carbidopa-levodopa (SINEMET IR) 25-100 MG tablet Take 2 tablets by mouth 6 (six) times daily. Qty: 1080 tablet, Refills: 1    carvedilol (COREG) 6.25 MG tablet Take 6.25 mg by mouth 2 (two) times daily.     isosorbide mononitrate (IMDUR) 30 MG 24 hr tablet Take 1 tablet (30 mg total) by mouth daily. Qty: 30 tablet, Refills: 0    lisinopril (ZESTRIL) 2.5 MG tablet Take 1 tablet (2.5 mg total) by mouth daily. Qty: 30 tablet, Refills: 0    nitroGLYCERIN (NITROSTAT) 0.4 MG SL tablet Place 0.4 mg under the tongue every 5 (five) minutes as needed for chest pain.    phenol (CHLORASEPTIC) 1.4 % LIQD Use as  directed 1 spray in the mouth or throat as needed for throat irritation / pain.    rosuvastatin (CRESTOR) 20 MG tablet Take 20 mg by mouth at bedtime.    rotigotine (NEUPRO) 2 MG/24HR Place 1 patch onto the skin daily. Qty: 7 patch, Refills: 0    ticagrelor (BRILINTA) 90 MG TABS tablet Take 90 mg by mouth every 12 (twelve) hours.    valACYclovir (VALTREX) 500 MG tablet Take 500 mg by mouth 2 (two) times daily.    rotigotine (NEUPRO) 4  MG/24HR Place 1 patch onto the skin daily. Qty: 90 patch, Refills: 1        If you experience worsening of your admission symptoms, develop shortness of breath, life threatening emergency, suicidal or homicidal thoughts you must seek medical attention immediately by calling 911 or calling your MD immediately  if symptoms less severe.  You Must read complete instructions/literature along with all the possible adverse reactions/side effects for all the Medicines you take and that have been prescribed to you. Take any new Medicines after you have completely understood and accept all the possible adverse reactions/side effects.   Please note  You were cared for by a hospitalist during your hospital stay. If you have any questions about your discharge medications or the care you received while you were in the hospital after you are discharged, you can call the unit and asked to speak with the hospitalist on call if the hospitalist that took care of you is not available. Once you are discharged, your primary care physician will handle any further medical issues. Please note that NO REFILLS for any discharge medications will be authorized once you are discharged, as it is imperative that you return to your primary care physician (or establish a relationship with a primary care physician if you do not have one) for your aftercare needs so that they can reassess your need for medications and monitor your lab values. Today   SUBJECTIVE   Feels a lot better. No CP  VITAL SIGNS:  Blood pressure 134/74, pulse (!) 47, temperature 97.9 F (36.6 C), temperature source Oral, resp. rate 16, height 5\' 11"  (1.803 m), weight 92.1 kg (203 lb), SpO2 98 %.  I/O:    Intake/Output Summary (Last 24 hours) at 06/14/17 0929 Last data filed at 06/14/17 0416  Gross per 24 hour  Intake           861.68 ml  Output              600 ml  Net           261.68 ml    PHYSICAL EXAMINATION:  GENERAL:  61 y.o.-year-old  patient lying in the bed with no acute distress.  EYES: Pupils equal, round, reactive to light and accommodation. No scleral icterus. Extraocular muscles intact.  HEENT: Head atraumatic, normocephalic. Oropharynx and nasopharynx clear.  NECK:  Supple, no jugular venous distention. No thyroid enlargement, no tenderness.  LUNGS: Normal breath sounds bilaterally, no wheezing, rales,rhonchi or crepitation. No use of accessory muscles of respiration.  CARDIOVASCULAR: S1, S2 normal. No murmurs, rubs, or gallops.  ABDOMEN: Soft, non-tender, non-distended. Bowel sounds present. No organomegaly or mass.  EXTREMITIES: No pedal edema, cyanosis, or clubbing.  NEUROLOGIC: Cranial nerves II through XII are intact. Muscle strength 5/5 in all extremities. Sensation intact. Gait not checked.  PSYCHIATRIC: The patient is alert and oriented x 3.  SKIN: No obvious rash, lesion, or ulcer.   DATA REVIEW:   CBC  Recent Labs Lab 06/14/17 0339  WBC 4.2  HGB 14.3  HCT 40.3  PLT 132*    Chemistries   Recent Labs Lab 06/12/17 0907 06/14/17 0339  NA 139 139  K 3.9 3.7  CL 103 108  CO2 27 26  GLUCOSE 135* 128*  BUN 13 11  CREATININE 0.98 0.85  CALCIUM 9.1 9.1  MG 2.2  --     Microbiology Results   No results found for this or any previous visit (from the past 240 hour(s)).  RADIOLOGY:  No results found.   Management plans discussed with the patient, family and they are in agreement.  CODE STATUS:     Code Status Orders        Start     Ordered   06/12/17 0559  Full code  Continuous     06/12/17 0558    Code Status History    Date Active Date Inactive Code Status Order ID Comments User Context   01/29/2017 10:56 AM 01/31/2017  2:46 PM Full Code [id number]  Ramonita Lab, MD Inpatient    Advance Directive Documentation     Most Recent Value  Type of Advance Directive  Healthcare Power of Attorney  Pre-existing out of facility DNR order (yellow form or pink MOST form)  -  "MOST"  Form in Place?  -      TOTAL TIME TAKING CARE OF THIS PATIENT: 40 minutes.    Elliett Guarisco M.D on 06/14/2017 at 9:29 AM  Between 7am to 6pm - Pager - [phone] After 6pm go to [email or web address] - password [email or web address]  [facility]  Office  [phone]  CC: Primary care physician; Duanne Limerick, MD

## 2017-06-14 NOTE — Progress Notes (Signed)
[facility] Encounter Note  Patient: Sean Barajas / Admit Date: 06/12/2017 / Date of Encounter: 06/14/2017, 8:00 AM   Subjective:  Patient had significant recurrent chest discomfort exactly as he had prior to admission to the hospital. With this chest discomfort it was not relieved by any medication management other than rest. Now chest pain completely resolved after PCI and stent placement of graft to right coronary artery and improved with ambulation today Cardiac catheterization shows occluded left anterior descending artery right coronary artery and graft to obtuse marginal artery. There was patent LIMA to the LAD and patent new stents of left circumflex and obtuse marginal. There was a significant distal graft stenosis to PDA which was new and likely causing current symptoms  Review of Systems: Positive for: None Negative for: Vision change, hearing change, syncope, dizziness, nausea, vomiting,diarrhea, bloody stool, stomach pain, cough, congestion, diaphoresis, urinary frequency, urinary pain,skin lesions, skin rashes Others previously listed  Objective: Telemetry: nsr Physical Exam: Blood pressure 135/72, pulse (!) 51, temperature 97.9 F (36.6 C), temperature source Oral, resp. rate 16, height 5\' 11"  (1.803 m), weight 92.1 kg (203 lb), SpO2 97 %. Body mass index is 28.31 kg/m. General: Well developed, well nourished, in no acute distress. Head: Normocephalic, atraumatic, sclera non-icteric, no xanthomas, nares are without discharge. Neck: No apparent masses Lungs: Normal respirations with no wheezes, no rhonchi, no rales , no crackles   Heart: Regular rate and rhythm, normal S1 S2, no murmur, no rub, no gallop, PMI is normal size and placement, carotid upstroke normal without bruit, jugular venous pressure normal Abdomen: Soft, non-tender, non-distended with normoactive bowel sounds. No hepatosplenomegaly. Abdominal aorta is normal size without  bruit Extremities: No edema, no clubbing, no cyanosis, no ulcers,  Peripheral: 2+ radial, 2+ femoral, 2+ dorsal pedal pulses Neuro: Alert and oriented. Moves all extremities spontaneously. Psych:  Responds to questions appropriately with a normal affect.   Intake/Output Summary (Last 24 hours) at 06/14/17 0800 Last data filed at 06/14/17 0416  Gross per 24 hour  Intake           861.68 ml  Output              600 ml  Net           261.68 ml    Inpatient Medications:  . ALPRAZolam  0.5 mg Oral TID  . aspirin EC  81 mg Oral Daily  . carbidopa-levodopa  1 tablet Oral QHS  . carbidopa-levodopa  3 tablet Oral Q0600   And  . carbidopa-levodopa  2 tablet Oral Q0600   And  . carbidopa-levodopa  3 tablet Oral Q0600   And  . carbidopa-levodopa  2 tablet Oral Q0600   And  . carbidopa-levodopa  2 tablet Oral Q0600  . carvedilol  6.25 mg Oral BID  . isosorbide mononitrate  30 mg Oral Daily  . lisinopril  2.5 mg Oral Daily  . rosuvastatin  20 mg Oral QHS  . rotigotine  1 patch Transdermal Daily  . sodium chloride flush  3 mL Intravenous Q12H  . ticagrelor  90 mg Oral Q12H  . valACYclovir  500 mg Oral BID   Infusions:  . sodium chloride      Labs:  Recent Labs  06/12/17 0907 06/14/17 0339  NA 139 139  K 3.9 3.7  CL 103 108  CO2 27 26  GLUCOSE 135* 128*  BUN 13 11  CREATININE 0.98 0.85  CALCIUM 9.1 9.1  MG 2.2  --  No results for input(s): AST, ALT, ALKPHOS, BILITOT, PROT, ALBUMIN in the last 72 hours.  Recent Labs  06/13/17 0430 06/14/17 0339  WBC 3.6* 4.2  HGB 14.0 14.3  HCT 40.2 40.3  MCV 90.0 89.4  PLT 141* 132*    Recent Labs  06/12/17 0907 06/12/17 1153 06/12/17 1754 06/13/17 1219  TROPONINI 0.05* 0.04* 0.05* <0.03   Invalid input(s): POCBNP No results for input(s): HGBA1C in the last 72 hours.   Weights: Filed Weights   06/12/17 0213 06/13/17 1544  Weight: 98.4 kg (217 lb) 92.1 kg (203 lb)     Radiology/Studies:  Dg Chest Portable 1  View  Result Date: 06/12/2017 CLINICAL DATA:  Chest pain EXAM: PORTABLE CHEST 1 VIEW COMPARISON:  01/29/2017 FINDINGS: Post sternotomy changes. Minimal atelectasis at the left base. Borderline cardiomegaly. No edema. No pneumothorax. IMPRESSION: Minimal atelectasis at the left base. Electronically Signed   By: Jasmine PangKim  Fujinaga M.D.   On: 06/12/2017 02:35   Dg Swallowing Func-speech Pathology  Result Date: 05/23/2017 Objective Swallowing Evaluation: Type of Study: MBS-Modified Barium Swallow Study Patient Details Name: Sean Barajas MRN: [id number] Date of Birth: 02/21/1956 Today's Date: 05/23/2017 Time: No Data Recorded-No Data Recorded No Data Recorded Past Medical History: Past Medical History: Diagnosis Date . Anxiety  . Coronary artery disease  . Hyperlipidemia  . Hypertension  . Parkinson's disease (HCC)  . Prediabetes  Past Surgical History: Past Surgical History: Procedure Laterality Date . APPENDECTOMY   . CARDIAC CATHETERIZATION   . CAROTID STENT  07/2015 . CHOLECYSTECTOMY   . CORONARY ANGIOPLASTY   . CORONARY ARTERY BYPASS GRAFT   . CORONARY STENT INTERVENTION N/A 01/30/2017  Procedure: Coronary Stent Intervention;  Surgeon: Marcina MillardAlexander Paraschos, MD;  Location: [facility] INVASIVE CV LAB;  Service: Cardiovascular;  Laterality: N/A; . EYE SURGERY Bilateral  . LEFT HEART CATH AND CORS/GRAFTS ANGIOGRAPHY N/A 01/30/2017  Procedure: Left Heart Cath and Cors/Grafts Angiography and possible PCI;  Surgeon: Marcina MillardAlexander Paraschos, MD;  Location: [facility] INVASIVE CV LAB;  Service: Cardiovascular;  Laterality: N/A; . NASAL SEPTUM SURGERY   . TONSILLECTOMY   No Data Recorded No Data Recorded Assessment / Plan / Recommendation CHL IP CLINICAL IMPRESSIONS 05/23/2017 Clinical Impression Pt exhibits mild-moderate oropharyngeal dysphagia and mild esophageal dysphagia as evidenced by minimal oral propulsion/weak lingual manipulation (solids), delay in the initiation of the swallow with all consistencies to the valleculae, trace  aspiration without chin tuck technique utilized with larger boluses of nectar-thickened liquids; with utilization of chin tuck with smaller sips, trace penetration only noted without aspiration with subsequent swallows of nectar-thickened liquids and thin liquids. Decreased tongue-base retraction/pharyngeal weakness noted with mild pharyngeal residue resulting, but cleared with repetitive swallows. Possible backflow noted with last swallow of solids during MBSS, so esophageal assessment may be indicated to r/o any esophageal deficits; pt c/o recent "reflux episodes" during intake of pmhx. Recommend Dysphagia 3 (mechanical/soft) diet with thin liquids utilizing chin tuck AT ALL TIMES during liquid intake d/t moderate risk for aspiration without use of chin tuck.   SLP Visit Diagnosis Dysphagia, oropharyngeal phase (R13.12);Dysphagia, pharyngoesophageal phase (R13.14) Attention and concentration deficit following -- Frontal lobe and executive function deficit following -- Impact on safety and function Mild aspiration risk   No flowsheet data found.  No flowsheet data found. CHL IP DIET RECOMMENDATION 05/23/2017 SLP Diet Recommendations Dysphagia 3 (Mech soft) solids;Thin liquid;Other (small sips, chin tuck) Liquid Administration via Cup;Straw;Other (small sips/chin tuck) Medication Administration Whole meds with puree Compensations Slow rate;Small sips/bites;Multiple dry swallows after each  bite/sip;Effortful swallow;Chin tuck Postural Changes Remain semi-upright after after feeds/meals (general esophageal precautions);Seated upright at 90 degrees   CHL IP OTHER RECOMMENDATIONS 05/23/2017 Recommended Consults Consider ENT evaluation;Consider esophageal assessment;Other (OP SLP for voice/dysphagia tx) Oral Care Recommendations Oral care BID Other Recommendations --   CHL IP FOLLOW UP RECOMMENDATIONS 05/23/2017 Follow up Recommendations Outpatient SLP   No flowsheet data found.     CHL IP ORAL PHASE 05/23/2017 Oral Phase  Impaired Oral - Pudding Teaspoon -- Oral - Pudding Cup -- Oral - Honey Teaspoon -- Oral - Honey Cup -- Oral - Nectar Teaspoon -- Oral - Nectar Cup -- Oral - Nectar Straw -- Oral - Thin Teaspoon -- Oral - Thin Cup -- Oral - Thin Straw -- Oral - Puree -- Oral - Mech Soft Impaired mastication;Weak lingual manipulation Oral - Regular -- Oral - Multi-Consistency -- Oral - Pill -- Oral Phase - Comment --  CHL IP PHARYNGEAL PHASE 05/23/2017 Pharyngeal Phase Impaired Pharyngeal- Pudding Teaspoon -- Pharyngeal -- Pharyngeal- Pudding Cup -- Pharyngeal -- Pharyngeal- Honey Teaspoon -- Pharyngeal -- Pharyngeal- Honey Cup -- Pharyngeal -- Pharyngeal- Nectar Teaspoon -- Pharyngeal -- Pharyngeal- Nectar Cup Delayed swallow initiation-vallecula;Reduced tongue base retraction;Penetration/Aspiration during swallow;Pharyngeal residue - posterior pharnyx;Compensatory strategies attempted (with notebox) Pharyngeal Material enters airway, remains ABOVE vocal cords then ejected out Pharyngeal- Nectar Straw Delayed swallow initiation-vallecula;Reduced tongue base retraction;Penetration/Aspiration during swallow;Trace aspiration;Pharyngeal residue - posterior pharnyx;Compensatory strategies attempted (with notebox) Pharyngeal Material enters airway, CONTACTS cords and then ejected out Pharyngeal- Thin Teaspoon -- Pharyngeal -- Pharyngeal- Thin Cup Delayed swallow initiation-vallecula;Reduced tongue base retraction;Penetration/Aspiration during swallow;Pharyngeal residue - posterior pharnyx;Compensatory strategies attempted (with notebox) Pharyngeal Material enters airway, remains ABOVE vocal cords then ejected out Pharyngeal- Thin Straw -- Pharyngeal -- Pharyngeal- Puree Delayed swallow initiation-vallecula;Reduced tongue base retraction;Pharyngeal residue - valleculae;Pharyngeal residue - posterior pharnyx;Compensatory strategies attempted (with notebox) Pharyngeal -- Pharyngeal- Mechanical Soft Delayed swallow initiation-vallecula;Reduced  tongue base retraction;Pharyngeal residue - valleculae;Pharyngeal residue - posterior pharnyx;Compensatory strategies attempted (with notebox) Pharyngeal -- Pharyngeal- Regular -- Pharyngeal -- Pharyngeal- Multi-consistency -- Pharyngeal -- Pharyngeal- Pill -- Pharyngeal -- Pharyngeal Comment (No Data)  CHL IP CERVICAL ESOPHAGEAL PHASE 05/23/2017 Cervical Esophageal Phase Impaired Pudding Teaspoon -- Pudding Cup -- Honey Teaspoon -- Honey Cup -- Nectar Teaspoon -- Nectar Cup -- Nectar Straw -- Thin Teaspoon -- Thin Cup -- Thin Straw -- Puree -- Mechanical Soft -- Regular -- Multi-consistency -- Pill -- Cervical Esophageal Comment (No Data) CHL IP GO 05/23/2017 Functional Assessment Tool Used NOMS; clinical judgment Functional Limitations Swallowing Swallow Current Status (J4782) CJ Swallow Goal Status (N5621) CJ Swallow Discharge Status (H0865) CJ Motor Speech Current Status (H8469) (None) Motor Speech Goal Status (G2952) (None) Motor Speech Goal Status (W4132) (None) Spoken Language Comprehension Current Status (G4010) (None) Spoken Language Comprehension Goal Status (U7253) (None) Spoken Language Comprehension Discharge Status (G6440) (None) Spoken Language Expression Current Status (H4742) (None) Spoken Language Expression Goal Status (V9563) (None) Spoken Language Expression Discharge Status (O7564) (None) Attention Current Status (P3295) (None) Attention Goal Status (J8841) (None) Attention Discharge Status (Y6063) (None) Memory Current Status (K1601) (None) Memory Goal Status (U9323) (None) Memory Discharge Status (F5732) (None) Voice Current Status (K0254) (None) Voice Goal Status (Y7062) (None) Voice Discharge Status (B7628) (None) Other Speech-Language Pathology Functional Limitation Current Status (B1517) (None) Other Speech-Language Pathology Functional Limitation Goal Status (O1607) (None) Other Speech-Language Pathology Functional Limitation Discharge Status [phone]) (None) Tressie Stalker, M.S., CCC-SLP  05/23/2017, 3:20 PM  Assessment and Recommendation  61 y.o. male chest pain with known cad on appropriate meds for which showed a medication management was tried and the patient had recurrent chest discomfort consistent with unstable angina with cardiac catheterization findings of new distal graft to right coronary artery stenosis needing further intervention 1. Successful PCI and stent placement of distal graft stenosis of graft to right coronary artery 2. Continue dual antiplatelet therapy including brilinta and aspirin 3. Continue high intensity cholesterol therapy with current medical regimen without change 4. ACE inhibitor beta blocker and isosorbide for coronary artery disease and angina 5. Cardiac rehabilitation has been discussed and we will be proceeding 6. Okay for discharge home today with follow-up next week  Signed, Arnoldo Hooker M.D. FACC

## 2017-06-17 ENCOUNTER — Encounter: Payer: Self-pay | Admitting: Family Medicine

## 2017-06-17 ENCOUNTER — Ambulatory Visit (INDEPENDENT_AMBULATORY_CARE_PROVIDER_SITE_OTHER): Payer: BLUE CROSS/BLUE SHIELD | Admitting: Family Medicine

## 2017-06-17 ENCOUNTER — Encounter: Payer: Self-pay | Admitting: Neurology

## 2017-06-17 ENCOUNTER — Ambulatory Visit: Payer: BLUE CROSS/BLUE SHIELD | Admitting: Neurology

## 2017-06-17 ENCOUNTER — Ambulatory Visit: Payer: BLUE CROSS/BLUE SHIELD | Admitting: Occupational Therapy

## 2017-06-17 ENCOUNTER — Ambulatory Visit (INDEPENDENT_AMBULATORY_CARE_PROVIDER_SITE_OTHER): Payer: BLUE CROSS/BLUE SHIELD | Admitting: Neurology

## 2017-06-17 VITALS — BP 122/80 | HR 60 | Ht 71.0 in | Wt 215.0 lb

## 2017-06-17 VITALS — BP 112/80 | HR 52 | Ht 71.0 in | Wt 216.0 lb

## 2017-06-17 DIAGNOSIS — R1319 Other dysphagia: Secondary | ICD-10-CM | POA: Diagnosis not present

## 2017-06-17 DIAGNOSIS — K117 Disturbances of salivary secretion: Secondary | ICD-10-CM | POA: Diagnosis not present

## 2017-06-17 DIAGNOSIS — G20 Parkinson's disease: Secondary | ICD-10-CM

## 2017-06-17 DIAGNOSIS — L739 Follicular disorder, unspecified: Secondary | ICD-10-CM

## 2017-06-17 DIAGNOSIS — L089 Local infection of the skin and subcutaneous tissue, unspecified: Secondary | ICD-10-CM

## 2017-06-17 DIAGNOSIS — Z09 Encounter for follow-up examination after completed treatment for conditions other than malignant neoplasm: Secondary | ICD-10-CM

## 2017-06-17 DIAGNOSIS — H66011 Acute suppurative otitis media with spontaneous rupture of ear drum, right ear: Secondary | ICD-10-CM | POA: Diagnosis not present

## 2017-06-17 DIAGNOSIS — G20A1 Parkinson's disease without dyskinesia, without mention of fluctuations: Secondary | ICD-10-CM

## 2017-06-17 MED ORDER — AMOXICILLIN-POT CLAVULANATE 875-125 MG PO TABS: 1.0000 | ORAL_TABLET | Freq: Two times a day (BID) | ORAL | 0 refills | Status: DC

## 2017-06-17 MED ORDER — MUPIROCIN 2 % EX OINT: 1.0000 "application " | TOPICAL_OINTMENT | Freq: Two times a day (BID) | CUTANEOUS | 0 refills | Status: DC

## 2017-06-17 MED ORDER — NEOMYCIN-POLYMYXIN-HC 3.5-10000-1 OT SUSP: 3.0000 [drp] | Freq: Four times a day (QID) | OTIC | 0 refills | Status: DC

## 2017-06-17 NOTE — Progress Notes (Signed)
Name: Sean Barajas   MRN: [id number]    DOB: [birth year]   Date:06/17/2017       Progress Note  Subjective  Chief Complaint  Chief Complaint  Patient presents with  . Follow-up    hospital discharge 06/14/2017 Dx unstable angina- stent placement  . Ear Drainage    bloody discharge R) ear  . arm rash    spot on arm L)- hurts    Patient is followup for unstable angina with stent.   Otalgia   There is pain in the right ear. This is a new problem. The current episode started in the past 7 days. The problem has been gradually improving. There has been no fever. The pain is moderate. Pertinent negatives include no abdominal pain, coughing, diarrhea, ear discharge, headaches, hearing loss, neck pain, rash, rhinorrhea, sore throat or vomiting. He has tried ear drops for the symptoms.  Ear Drainage   There is pain in the right ear. This is a new problem. The current episode started 1 to 4 weeks ago. The problem has been waxing and waning. There has been no fever. Pertinent negatives include no abdominal pain, coughing, diarrhea, ear discharge, headaches, hearing loss, neck pain, rash, rhinorrhea, sore throat or vomiting.  Rash  This is a new problem. The current episode started yesterday. The affected locations include the left arm and right axilla. Pertinent negatives include no anorexia, congestion, cough, diarrhea, facial edema, fever, joint pain, rhinorrhea, shortness of breath, sore throat or vomiting. Past treatments include nothing.    No problem-specific Assessment & Plan notes found for this encounter.   Past Medical History:  Diagnosis Date  . Anxiety   . Coronary artery disease   . Hyperlipidemia   . Hypertension   . Parkinson's disease (HCC)   . Prediabetes     Past Surgical History:  Procedure Laterality Date  . APPENDECTOMY    . CARDIAC CATHETERIZATION    . CAROTID STENT  07/2015  . CHOLECYSTECTOMY    . CORONARY ANGIOPLASTY    . CORONARY ARTERY BYPASS GRAFT    .  CORONARY STENT INTERVENTION N/A 01/30/2017   Procedure: Coronary Stent Intervention;  Surgeon: Marcina Millard, MD;  Location: [facility] INVASIVE CV LAB;  Service: Cardiovascular;  Laterality: N/A;  . CORONARY STENT INTERVENTION N/A 06/13/2017   Procedure: Coronary Stent Intervention;  Surgeon: Iran Ouch, MD;  Location: [facility] INVASIVE CV [facility];  Service: Cardiovascular;  Laterality: N/A;  . EYE SURGERY Bilateral   . LEFT HEART CATH AND CORS/GRAFTS ANGIOGRAPHY N/A 01/30/2017   Procedure: Left Heart Cath and Cors/Grafts Angiography and possible PCI;  Surgeon: Marcina Millard, MD;  Location: [facility] INVASIVE CV [facility];  Service: Cardiovascular;  Laterality: N/A;  . LEFT HEART CATH AND CORS/GRAFTS ANGIOGRAPHY N/A 06/13/2017   Procedure: Left Heart Cath and Cors/Grafts Angiography;  Surgeon: Lamar Blinks, MD;  Location: [facility] INVASIVE CV LAB;  Service: Cardiovascular;  Laterality: N/A;  . NASAL SEPTUM SURGERY    . TONSILLECTOMY      No family history on file.  Social History   Social History  . Marital status: Single    Spouse name: N/A  . Number of children: N/A  . Years of education: N/A   Occupational History  . self employed     toe truck driver   Social History Main Topics  . Smoking status: Never Smoker  . Smokeless tobacco: Never Used  . Alcohol use 0.0 oz/week     Comment: 1 glass wine/month  . Drug  use: Yes     Comment: hemp oil  . Sexual activity: Not on file   Other Topics Concern  . Not on file   Social History Narrative  . No narrative on file    Allergies  Allergen Reactions  . Iodinated Diagnostic Agents Nausea Only and Other (See Comments)    Reaction:  Fever   . Sulfa Antibiotics Rash    Outpatient Medications Prior to Visit  Medication Sig Dispense Refill  . acetaminophen (TYLENOL) 500 MG tablet Take 1,000 mg by mouth every 6 (six) hours as needed for mild pain.    Marland Kitchen. albuterol (PROVENTIL HFA;VENTOLIN HFA) 108 (90 BASE) MCG/ACT inhaler Inhale 2 puffs  into the lungs every 6 (six) hours as needed for wheezing or shortness of breath.    . ALPRAZolam (XANAX) 0.5 MG tablet Take 0.5 mg by mouth 3 (three) times daily. Dr Malvin JohnsPotter    . ARTIFICIAL TEAR OP Apply 1 drop to eye as needed (for dry eyes).    Marland Kitchen. aspirin EC 81 MG tablet Take 81 mg by mouth daily.    . carbidopa-levodopa (SINEMET CR) 50-200 MG tablet Take 1 tablet by mouth at bedtime. 90 tablet 1  . carbidopa-levodopa (SINEMET IR) 25-100 MG tablet Take 2 tablets by mouth 6 (six) times daily. (Patient taking differently: 3/2/3/2/2) 1080 tablet 1  . carvedilol (COREG) 6.25 MG tablet Take 6.25 mg by mouth 2 (two) times daily.     . isosorbide mononitrate (IMDUR) 30 MG 24 hr tablet Take 1 tablet (30 mg total) by mouth daily. 30 tablet 0  . lisinopril (ZESTRIL) 2.5 MG tablet Take 1 tablet (2.5 mg total) by mouth daily. 30 tablet 0  . nitroGLYCERIN (NITROSTAT) 0.4 MG SL tablet Place 0.4 mg under the tongue every 5 (five) minutes as needed for chest pain.    . phenol (CHLORASEPTIC) 1.4 % LIQD Use as directed 1 spray in the mouth or throat as needed for throat irritation / pain.    . rosuvastatin (CRESTOR) 20 MG tablet Take 20 mg by mouth at bedtime.    . rotigotine (NEUPRO) 4 MG/24HR Place 1 patch onto the skin daily. 90 patch 1  . ticagrelor (BRILINTA) 90 MG TABS tablet Take 90 mg by mouth every 12 (twelve) hours.    . valACYclovir (VALTREX) 500 MG tablet Take 500 mg by mouth 2 (two) times daily.     No facility-administered medications prior to visit.     Review of Systems  Constitutional: Negative for chills, fever, malaise/fatigue and weight loss.  HENT: Positive for ear pain. Negative for congestion, ear discharge, hearing loss, rhinorrhea and sore throat.   Eyes: Negative for blurred vision.  Respiratory: Negative for cough, sputum production, shortness of breath and wheezing.   Cardiovascular: Negative for chest pain, palpitations and leg swelling.  Gastrointestinal: Negative for abdominal  pain, anorexia, blood in stool, constipation, diarrhea, heartburn, melena, nausea and vomiting.  Genitourinary: Negative for dysuria, frequency, hematuria and urgency.  Musculoskeletal: Negative for back pain, joint pain, myalgias and neck pain.  Skin: Negative for rash.  Neurological: Negative for dizziness, tingling, sensory change, focal weakness and headaches.  Endo/Heme/Allergies: Negative for environmental allergies and polydipsia. Does not bruise/bleed easily.  Psychiatric/Behavioral: Negative for depression and suicidal ideas. The patient is not nervous/anxious and does not have insomnia.      Objective  Vitals:   06/17/17 1434  BP: 122/80  Pulse: 60  Weight: 215 lb (97.5 kg)  Height: 5\' 11"  (1.803 m)  Physical Exam  Constitutional: He is oriented to person, place, and time and well-developed, well-nourished, and in no distress.  HENT:  Head: Normocephalic.  Right Ear: External ear normal. Tympanic membrane is retracted.  Left Ear: Tympanic membrane, external ear and ear canal normal.  Nose: Nose normal.  Mouth/Throat: Oropharynx is clear and moist.  hematoma  Eyes: Pupils are equal, round, and reactive to light. Conjunctivae and EOM are normal. Right eye exhibits no discharge. Left eye exhibits no discharge. No scleral icterus.  Neck: Normal range of motion. Neck supple. No JVD present. No tracheal deviation present. No thyromegaly present.  Cardiovascular: Normal rate, regular rhythm, normal heart sounds and intact distal pulses.  Exam reveals no gallop and no friction rub.   No murmur heard. Pulmonary/Chest: Breath sounds normal. No respiratory distress. He has no wheezes. He has no rales.  Abdominal: Soft. Bowel sounds are normal. He exhibits no mass. There is no hepatosplenomegaly. There is no tenderness. There is no rebound, no guarding and no CVA tenderness.  Musculoskeletal: Normal range of motion. He exhibits no edema or tenderness.  Lymphadenopathy:    He has  no cervical adenopathy.  Neurological: He is alert and oriented to person, place, and time. He has normal sensation, normal strength, normal reflexes and intact cranial nerves. No cranial nerve deficit.  Skin: Skin is warm. No rash noted.  Psychiatric: Mood and affect normal.  Nursing note and vitals reviewed.     Assessment & Plan  Problem List Items Addressed This Visit    None    Visit Diagnoses    Hospital discharge follow-up    -  Primary   proceed cardiac followup   Folliculitis       Relevant Medications   amoxicillin-clavulanate (AUGMENTIN) 875-125 MG tablet   mupirocin ointment (BACTROBAN) 2 %   Acute suppurative otitis media of right ear with spontaneous rupture of tympanic membrane, recurrence not specified       Relevant Medications   amoxicillin-clavulanate (AUGMENTIN) 875-125 MG tablet   neomycin-polymyxin-hydrocortisone (CORTISPORIN) 3.5-10000-1 OTIC suspension      Meds ordered this encounter  Medications  . amoxicillin-clavulanate (AUGMENTIN) 875-125 MG tablet    Sig: Take 1 tablet by mouth 2 (two) times daily.    Dispense:  20 tablet    Refill:  0  . mupirocin ointment (BACTROBAN) 2 %    Sig: Apply 1 application topically 2 (two) times daily.    Dispense:  22 g    Refill:  0  . neomycin-polymyxin-hydrocortisone (CORTISPORIN) 3.5-10000-1 OTIC suspension    Sig: Place 3 drops into both ears 4 (four) times daily.    Dispense:  10 mL    Refill:  0      Dr. Hayden [facility]  06/17/17

## 2017-06-18 ENCOUNTER — Other Ambulatory Visit: Payer: Self-pay | Admitting: Neurology

## 2017-06-18 ENCOUNTER — Ambulatory Visit: Payer: BLUE CROSS/BLUE SHIELD | Admitting: Occupational Therapy

## 2017-06-18 ENCOUNTER — Ambulatory Visit
Admission: RE | Admit: 2017-06-18 | Discharge: 2017-06-18 | Disposition: A | Discharge status: Home / Self Care | Payer: BLUE CROSS/BLUE SHIELD | Source: Ambulatory Visit | Attending: Nurse Practitioner | Admitting: Nurse Practitioner

## 2017-06-18 DIAGNOSIS — K449 Diaphragmatic hernia without obstruction or gangrene: Secondary | ICD-10-CM | POA: Insufficient documentation

## 2017-06-18 DIAGNOSIS — R1310 Dysphagia, unspecified: Secondary | ICD-10-CM | POA: Diagnosis not present

## 2017-06-18 DIAGNOSIS — G20 Parkinson's disease: Secondary | ICD-10-CM | POA: Diagnosis not present

## 2017-06-18 DIAGNOSIS — K222 Esophageal obstruction: Secondary | ICD-10-CM | POA: Diagnosis not present

## 2017-06-25 ENCOUNTER — Encounter: Payer: Self-pay | Admitting: Endocrinology

## 2017-06-25 ENCOUNTER — Ambulatory Visit (INDEPENDENT_AMBULATORY_CARE_PROVIDER_SITE_OTHER): Payer: BLUE CROSS/BLUE SHIELD | Admitting: Endocrinology

## 2017-06-25 DIAGNOSIS — E1159 Type 2 diabetes mellitus with other circulatory complications: Secondary | ICD-10-CM

## 2017-06-25 MED ORDER — DAPAGLIFLOZIN PROPANEDIOL 5 MG PO TABS: 5.0000 mg | ORAL_TABLET | Freq: Every day | ORAL | 11 refills | Status: DC

## 2017-06-25 MED ORDER — GLUCOSE BLOOD VI STRP: 1.0000 | ORAL_STRIP | Freq: Every day | 12 refills | Status: DC

## 2017-06-25 NOTE — Progress Notes (Signed)
Subjective:    Patient ID: Sean Barajas, male    DOB: Oct 19, 1956, 61 y.o.   MRN: [id number]  HPI pt is referred by Dr Yetta Barre, for diabetes.  Pt states DM was dx'ed in 2018; he has mild if any neuropathy of the lower extremities; he has associated CAD; he has never been on medication for DM; pt says his diet is fair, but exercise is limited by Parkinson's; he has never had pancreatitis, pancreatic surgery, severe hypoglycemia or DKA.    Past Medical History:  Diagnosis Date  . Anxiety   . Coronary artery disease   . Hyperlipidemia   . Hypertension   . Parkinson's disease (HCC)   . Prediabetes     Past Surgical History:  Procedure Laterality Date  . APPENDECTOMY    . CARDIAC CATHETERIZATION    . CAROTID STENT  07/2015  . CHOLECYSTECTOMY    . CORONARY ANGIOPLASTY    . CORONARY ARTERY BYPASS GRAFT    . CORONARY STENT INTERVENTION N/A 01/30/2017   Procedure: Coronary Stent Intervention;  Surgeon: Marcina Millard, MD;  Location: [facility];  Service: Cardiovascular;  Laterality: N/A;  . CORONARY STENT INTERVENTION N/A 06/13/2017   Procedure: Coronary Stent Intervention;  Surgeon: Iran Ouch, MD;  Location: [facility];  Service: Cardiovascular;  Laterality: N/A;  . EYE SURGERY Bilateral   . LEFT HEART CATH AND CORS/GRAFTS ANGIOGRAPHY N/A 01/30/2017   Procedure: Left Heart Cath and Cors/Grafts Angiography and possible PCI;  Surgeon: Marcina Millard, MD;  Location: [facility];  Service: Cardiovascular;  Laterality: N/A;  . LEFT HEART CATH AND CORS/GRAFTS ANGIOGRAPHY N/A 06/13/2017   Procedure: Left Heart Cath and Cors/Grafts Angiography;  Surgeon: Lamar Blinks, MD;  Location: [facility];  Service: Cardiovascular;  Laterality: N/A;  . NASAL SEPTUM SURGERY    . TONSILLECTOMY      Social History   Social History  . Marital status: Single    Spouse name: N/A  . Number of children: N/A  . Years of education: N/A    Occupational History  . self employed     toe truck driver   Social History Main Topics  . Smoking status: Never Smoker  . Smokeless tobacco: Never Used  . Alcohol use 0.0 oz/week     Comment: 1 glass wine/month  . Drug use: Yes     Comment: hemp oil  . Sexual activity: Not on file   Other Topics Concern  . Not on file   Social History Narrative  . No narrative on file    Current Outpatient Prescriptions on File Prior to Visit  Medication Sig Dispense Refill  . acetaminophen (TYLENOL) 500 MG tablet Take 1,000 mg by mouth every 6 (six) hours as needed for mild pain.    Marland Kitchen albuterol (PROVENTIL HFA;VENTOLIN HFA) 108 (90 BASE) MCG/ACT inhaler Inhale 2 puffs into the lungs every 6 (six) hours as needed for wheezing or shortness of breath.    . ALPRAZolam (XANAX) 0.5 MG tablet Take 0.5 mg by mouth 3 (three) times daily. Dr Malvin Johns    . amoxicillin-clavulanate (AUGMENTIN) 875-125 MG tablet Take 1 tablet by mouth 2 (two) times daily. 20 tablet 0  . ARTIFICIAL TEAR OP Apply 1 drop to eye as needed (for dry eyes).    Marland Kitchen aspirin EC 81 MG tablet Take 81 mg by mouth daily.    . carbidopa-levodopa (SINEMET CR) 50-200 MG tablet Take 1 tablet by mouth at bedtime. 90  tablet 1  . carbidopa-levodopa (SINEMET IR) 25-100 MG tablet Take 2 tablets by mouth 6 (six) times daily. (Patient taking differently: 3/2/3/2/2) 1080 tablet 1  . carvedilol (COREG) 6.25 MG tablet Take 6.25 mg by mouth 2 (two) times daily.     . isosorbide mononitrate (IMDUR) 30 MG 24 hr tablet Take 1 tablet (30 mg total) by mouth daily. 30 tablet 0  . lisinopril (ZESTRIL) 2.5 MG tablet Take 1 tablet (2.5 mg total) by mouth daily. 30 tablet 0  . mupirocin ointment (BACTROBAN) 2 % Apply 1 application topically 2 (two) times daily. 22 g 0  . neomycin-polymyxin-hydrocortisone (CORTISPORIN) 3.5-10000-1 OTIC suspension Place 3 drops into both ears 4 (four) times daily. 10 mL 0  . NEUPRO 4 MG/24HR APPLY 1 PATCH DAILY. 30 patch 4  .  nitroGLYCERIN (NITROSTAT) 0.4 MG SL tablet Place 0.4 mg under the tongue every 5 (five) minutes as needed for chest pain.    . phenol (CHLORASEPTIC) 1.4 % LIQD Use as directed 1 spray in the mouth or throat as needed for throat irritation / pain.    . rosuvastatin (CRESTOR) 20 MG tablet Take 20 mg by mouth at bedtime.    . ticagrelor (BRILINTA) 90 MG TABS tablet Take 90 mg by mouth every 12 (twelve) hours.    . valACYclovir (VALTREX) 500 MG tablet Take 500 mg by mouth 2 (two) times daily.     No current facility-administered medications on file prior to visit.     Allergies  Allergen Reactions  . Iodinated Diagnostic Agents Nausea Only and Other (See Comments)    Reaction:  Fever   . Sulfa Antibiotics Rash    Family History  Problem Relation Age of Onset  . Diabetes Father   . Diabetes Brother     BP 138/80   Pulse 66   Wt 216 lb 9.6 oz (98.2 kg)   SpO2 94%   BMI 30.21 kg/m    Review of Systems denies blurry vision, headache, sob, muscle cramps, excessive diaphoresis, memory loss, and rhinorrhea.  He has weight gain.  He has leg edema, easy bruising, and cold interance.  He has intermitt n/v.  He has nocturia 4-5 times per day.    Objective:   Physical Exam VS: see vs page GEN: no distress HEAD: head: no deformity eyes: no periorbital swelling, no proptosis external nose and ears are normal mouth: no lesion seen NECK: supple, thyroid is not enlarged CHEST WALL: no deformity LUNGS: clear to auscultation CV: reg rate and rhythm, no murmur ABD: abdomen is soft, nontender.  no hepatosplenomegaly.  not distended.  no hernia MUSCULOSKELETAL: muscle bulk and strength are grossly normal.  no obvious joint swelling.  gait is slow but steady.  EXTEMITIES: no deformity.  no ulcer on the feet.  feet are of normal color and temp.  1+ bilat leg edema.  PULSES: dorsalis pedis intact bilat.  no carotid bruit NEURO:  cn 2-12 grossly intact, but speech is slow.   readily moves all 4's.   sensation is intact to touch on the feet SKIN:  Normal texture and temperature.  No rash or suspicious lesion is visible.  Old healed surgical scar (vein harvest) at the left leg.   NODES:  None palpable at the neck PSYCH: alert, well-oriented.  Does not appear anxious nor depressed.   Lab Results  Component Value Date   HGBA1C 6.8 (H) 05/13/2017   Lab Results  Component Value Date   CREATININE 0.85 06/14/2017   BUN  11 06/14/2017   NA 139 06/14/2017   K 3.7 06/14/2017   CL 108 06/14/2017   CO2 26 06/14/2017   Lab Results  Component Value Date   TSH 2.556 06/12/2017    I personally reviewed electrocardiogram tracing (06/14/17): Indication: angina Impression: SB  No MI.  No hypertrophy. Compared to 01/29/17: no significant change     Assessment & Plan:  Type 2 DM, with CAD, new N/v: this precludes metformin. Edema: this is a relative contraindication to pioglitizone.   Patient Instructions  good diet and exercise significantly improve the control of your diabetes.  please let me know if you wish to be referred to a dietician.  high blood sugar is very risky to your health.  you should see an eye doctor and dentist every year.  It is very important to get all recommended vaccinations.  Controlling your blood pressure and cholesterol drastically reduces the damage diabetes does to your body.  Those who smoke should quit.  Please discuss these with your doctor.  check your blood sugar once a day.  vary the time of day when you check, between before the 3 meals, and at bedtime.  also check if you have symptoms of your blood sugar being too high or too low.  please keep a record of the readings and bring it to your next appointment here (or you can bring the meter itself).  You can write it on any piece of paper.  please call us sooner if your blood sugar goes below 70, or if you have a lot of readings over 200. I have sent a prescription to your pharmacy, for "farxiga."   Here is a  new meter.  I have sent a prescription to your pharmacy, for strips.  Please come back for a follow-up appointment in 3 months.

## 2017-06-25 NOTE — Patient Instructions (Addendum)
good diet and exercise significantly improve the control of your diabetes.  please let me know if you wish to be referred to a dietician.  high blood sugar is very risky to your health.  you should see an eye doctor and dentist every year.  It is very important to get all recommended vaccinations.  Controlling your blood pressure and cholesterol drastically reduces the damage diabetes does to your body.  Those who smoke should quit.  Please discuss these with your doctor.  check your blood sugar once a day.  vary the time of day when you check, between before the 3 meals, and at bedtime.  also check if you have symptoms of your blood sugar being too high or too low.  please keep a record of the readings and bring it to your next appointment here (or you can bring the meter itself).  You can write it on any piece of paper.  please call us sooner if your blood sugar goes below 70, or if you have a lot of readings over 200. I have sent a prescription to your pharmacy, for "farxiga."   Here is a new meter.  I have sent a prescription to your pharmacy, for strips.  Please come back for a follow-up appointment in 3 months.

## 2017-06-26 DIAGNOSIS — E119 Type 2 diabetes mellitus without complications: Secondary | ICD-10-CM | POA: Insufficient documentation

## 2017-06-27 ENCOUNTER — Telehealth: Payer: Self-pay | Admitting: Endocrinology

## 2017-06-27 MED ORDER — EMPAGLIFLOZIN 10 MG PO TABS: 10.0000 mg | ORAL_TABLET | Freq: Every day | ORAL | 11 refills | Status: DC

## 2017-06-27 NOTE — Telephone Encounter (Signed)
Spoke with the patient and he stated an understanding- he stated he paid out of pocket for farxiga this time but will get the Jardiance next time

## 2017-06-27 NOTE — Telephone Encounter (Signed)
please call patient: Ins wants you to take jardiance instead of farxiga.  They are similar.  I have sent a prescription to your pharmacy.  I'll see you next time.

## 2017-06-28 ENCOUNTER — Other Ambulatory Visit: Payer: Self-pay

## 2017-06-28 ENCOUNTER — Telehealth: Payer: Self-pay

## 2017-06-28 DIAGNOSIS — H66011 Acute suppurative otitis media with spontaneous rupture of ear drum, right ear: Secondary | ICD-10-CM

## 2017-06-28 DIAGNOSIS — L739 Follicular disorder, unspecified: Secondary | ICD-10-CM

## 2017-06-28 MED ORDER — MUPIROCIN 2 % EX OINT: 1.0000 "application " | TOPICAL_OINTMENT | Freq: Two times a day (BID) | CUTANEOUS | 0 refills | Status: DC

## 2017-06-28 MED ORDER — AMOXICILLIN-POT CLAVULANATE 875-125 MG PO TABS: 1.0000 | ORAL_TABLET | Freq: Two times a day (BID) | ORAL | 0 refills | Status: DC

## 2017-06-28 NOTE — Telephone Encounter (Signed)
t called and wanted refill on Aug and Ointment- [facility]

## 2017-07-22 ENCOUNTER — Emergency Department: Payer: BLUE CROSS/BLUE SHIELD

## 2017-07-22 ENCOUNTER — Emergency Department
Admission: EM | Admit: 2017-07-22 | Discharge: 2017-07-22 | Disposition: A | Discharge status: Home / Self Care | Payer: BLUE CROSS/BLUE SHIELD | Attending: Emergency Medicine | Admitting: Emergency Medicine

## 2017-07-22 DIAGNOSIS — E119 Type 2 diabetes mellitus without complications: Secondary | ICD-10-CM | POA: Diagnosis not present

## 2017-07-22 DIAGNOSIS — I10 Essential (primary) hypertension: Secondary | ICD-10-CM | POA: Diagnosis not present

## 2017-07-22 DIAGNOSIS — Y939 Activity, unspecified: Secondary | ICD-10-CM | POA: Diagnosis not present

## 2017-07-22 DIAGNOSIS — G20 Parkinson's disease: Secondary | ICD-10-CM | POA: Diagnosis not present

## 2017-07-22 DIAGNOSIS — M7051 Other bursitis of knee, right knee: Secondary | ICD-10-CM

## 2017-07-22 DIAGNOSIS — I259 Chronic ischemic heart disease, unspecified: Secondary | ICD-10-CM | POA: Insufficient documentation

## 2017-07-22 DIAGNOSIS — M25561 Pain in right knee: Secondary | ICD-10-CM | POA: Diagnosis present

## 2017-07-22 LAB — SYNOVIAL CELL COUNT + DIFF, W/ CRYSTALS
Crystals, Fluid: NONE SEEN
Eosinophils-Synovial: 0 %
Lymphocytes-Synovial Fld: 3 %
Monocyte-Macrophage-Synovial Fluid: 10 %
Neutrophil, Synovial: 87 %
WBC, Synovial: 2069 /mm3 — ABNORMAL HIGH (ref 0–200)

## 2017-07-22 MED ORDER — CEPHALEXIN 250 MG PO CAPS: 250.0000 mg | ORAL_CAPSULE | Freq: Four times a day (QID) | ORAL | 0 refills | Status: DC

## 2017-07-22 MED ORDER — OXYCODONE-ACETAMINOPHEN 5-325 MG PO TABS
2.0000 | ORAL_TABLET | Freq: Once | ORAL | Status: AC
  Administered 2017-07-22: 2 via ORAL
  Filled 2017-07-22: qty 2

## 2017-07-22 MED ORDER — LIDOCAINE HCL (PF) 1 % IJ SOLN
INTRAMUSCULAR | Status: AC
  Filled 2017-07-22: qty 5

## 2017-07-22 MED ORDER — LIDOCAINE-EPINEPHRINE (PF) 1 %-1:200000 IJ SOLN
30.0000 mL | Freq: Once | INTRAMUSCULAR | Status: DC
  Filled 2017-07-22: qty 30

## 2017-07-22 MED ORDER — IBUPROFEN 800 MG PO TABS: 800.0000 mg | ORAL_TABLET | Freq: Three times a day (TID) | ORAL | 0 refills | Status: DC | PRN

## 2017-07-22 NOTE — ED Notes (Signed)
Upon assessment pt's right knee swollen and warm to touch. Pedal pulses present equal.

## 2017-07-22 NOTE — ED Triage Notes (Signed)
Patient reports left knee pain and swelling that began at 10 pm.  Denies any type of injury.  Patient reports had cardiac stents placed approximately 3 weeks ago.

## 2017-07-22 NOTE — ED Provider Notes (Signed)
[facility] Emergency Department Provider Note       Time seen: ----------------------------------------- 7:49 AM on 07/22/2017 -----------------------------------------     I have reviewed the triage vital signs and the nursing notes.   HISTORY   Chief Complaint Knee Pain    HPI Sean Barajas is a 61 y.o. male who presents to the ED for right knee pain and swelling that began around 10 PM last night. Patient denies any known injury but he does have a history of falling. He does not remove falling but he complains of right knee pain. He reports he had a heart catheter 3 weeks ago and had stents placed. He is concerned about a blood clot in his right leg. He complains of pain 10 out of 10 in the right knee.   Past Medical History:  Diagnosis Date  . Anxiety   . Coronary artery disease   . Hyperlipidemia   . Hypertension   . Parkinson's disease (HCC)   . Prediabetes     Patient Active Problem List   Diagnosis Date Noted  . Diabetes (HCC) 06/26/2017  . CAD (coronary artery disease) 01/30/2017  . Unstable angina (HCC) 01/29/2017  . Aortic atherosclerosis (HCC) 08/28/2016    Past Surgical History:  Procedure Laterality Date  . APPENDECTOMY    . CARDIAC CATHETERIZATION    . CAROTID STENT  07/2015  . CHOLECYSTECTOMY    . CORONARY ANGIOPLASTY    . CORONARY ARTERY BYPASS GRAFT    . CORONARY STENT INTERVENTION N/A 01/30/2017   Procedure: Coronary Stent Intervention;  Surgeon: Marcina Millard, MD;  Location: [facility];  Service: Cardiovascular;  Laterality: N/A;  . CORONARY STENT INTERVENTION N/A 06/13/2017   Procedure: Coronary Stent Intervention;  Surgeon: Iran Ouch, MD;  Location: [facility];  Service: Cardiovascular;  Laterality: N/A;  . EYE SURGERY Bilateral   . LEFT HEART CATH AND CORS/GRAFTS ANGIOGRAPHY N/A 01/30/2017   Procedure: Left Heart Cath and Cors/Grafts Angiography and possible PCI;  Surgeon:  Marcina Millard, MD;  Location: [facility];  Service: Cardiovascular;  Laterality: N/A;  . LEFT HEART CATH AND CORS/GRAFTS ANGIOGRAPHY N/A 06/13/2017   Procedure: Left Heart Cath and Cors/Grafts Angiography;  Surgeon: Lamar Blinks, MD;  Location: [facility];  Service: Cardiovascular;  Laterality: N/A;  . NASAL SEPTUM SURGERY    . TONSILLECTOMY      Allergies Iodinated diagnostic agents and Sulfa antibiotics  Social History Social History  Substance Use Topics  . Smoking status: Never Smoker  . Smokeless tobacco: Never Used  . Alcohol use 0.0 oz/week     Comment: 1 glass wine/month    Review of Systems Constitutional: Negative for fever. Cardiovascular: Negative for chest pain. Respiratory: Negative for shortness of breath. Gastrointestinal: Negative for abdominal pain, vomiting and diarrhea. Genitourinary: Negative for dysuria. Musculoskeletal: Positive for right knee pain Skin: Negative for rash. Neurological: Negative for headaches, focal weakness or numbness.  All systems negative/normal/unremarkable except as stated in the HPI  ____________________________________________   PHYSICAL EXAM:  VITAL SIGNS: ED Triage Vitals  Enc Vitals Group     BP 07/22/17 0525 (!) 148/88     Pulse Rate 07/22/17 0525 64     Resp 07/22/17 0525 20     Temp 07/22/17 0525 99.7 F (37.6 C)     Temp Source 07/22/17 0525 Oral     SpO2 07/22/17 0525 95 %     Weight 07/22/17 0524 214 lb (97.1 kg)  Height 07/22/17 0524 5\' 11"  (1.803 m)     Head Circumference --      Peak Flow --      Pain Score 07/22/17 0523 10     Pain Loc --      Pain Edu? --      Excl. in GC? --     Constitutional: Alert and oriented. Well appearing and in no distress. Eyes: Conjunctivae are normal. Normal extraocular movements. Cardiovascular: Normal rate, regular rhythm. No murmurs, rubs, or gallops. Respiratory: Normal respiratory effort without tachypnea nor retractions. Breath  sounds are clear and equal bilaterally. No wheezes/rales/rhonchi. Gastrointestinal: Soft and nontender. Normal bowel sounds Musculoskeletal: Right prepatellar swelling, mild warmth is noted. No obvious joint effusion. Pain with range of motion of the right knee. Neurologic:  Normal speech and language. No gross focal neurologic deficits are appreciated.  Skin:  Small superficial laceration noted to the right lower leg distally Psychiatric: Mood and affect are normal. Speech and behavior are normal.  ____________________________________________  ED COURSE:  Pertinent labs & imaging results that were available during my care of the patient were reviewed by me and considered in my medical decision making (see chart for details). Patient presents for right leg pain, we will assess with imaging as indicated.   .Joint Aspiration/Arthrocentesis Date/Time: 07/22/2017 9:57 AM Performed by: Emily Filbert Authorized by: Daryel November E   Consent:    Consent obtained:  Verbal   Consent given by:  Patient Location:    Location:  Knee   Knee:  R knee Anesthesia (see MAR for exact dosages):    Anesthesia method:  Local infiltration   Local anesthetic:  Lidocaine 1% WITH epi Procedure details:    Needle gauge:  18 G   Ultrasound guidance: no     Approach:  Anterior   Aspirate amount:  5ml   Aspirate characteristics:  Yellow   Steroid injected: no     Specimen collected: yes   Post-procedure details:    Dressing:  Adhesive bandage   Patient tolerance of procedure:  Tolerated well, no immediate complications   ____________________________________________   LABS (pertinent positives/negatives)  Labs Reviewed  GRAM STAIN  BODY FLUID CULTURE  SYNOVIAL CELL COUNT + DIFF, W/ CRYSTALS    RADIOLOGY Images were viewed by me  Right knee x-rays reveal IMPRESSION: Soft tissue edema with more focal soft tissue prominence in the prepatellar region, suggesting prepatellar  bursitis.  No acute osseous abnormality. Right lower extremity ultrasound is negative for DVT ____________________________________________  FINAL ASSESSMENT AND PLAN  Bursitis  Plan: Patient's labs and imaging were dictated above. Patient had presented for Right knee pain and swelling which appeared to be from bursitis. I did aspirate the right prepatellar bursa with 5 cc withdrawn. I did send this for fluid analysis. This appears to be sterile inflammatory fluid clinically but we will contact him with any abnormal lab results. He will be referred to orthopedics for outpatient follow-up.   Emily Filbert, MD   Note: This note was generated in part or whole with voice recognition software. Voice recognition is usually quite accurate but there are transcription errors that can and very often do occur. I apologize for any typographical errors that were not detected and corrected.     Emily Filbert, MD 07/22/17 [phone]

## 2017-07-22 NOTE — ED Notes (Signed)
ACE wrap applied, pulses present, skin color baseline.

## 2017-07-22 NOTE — ED Notes (Signed)
Spoke with Dr. Zenda Alpers regarding patient, order given.

## 2017-07-23 ENCOUNTER — Telehealth: Payer: Self-pay | Admitting: Emergency Medicine

## 2017-07-23 ENCOUNTER — Telehealth (HOSPITAL_BASED_OUTPATIENT_CLINIC_OR_DEPARTMENT_OTHER): Payer: Self-pay | Admitting: *Deleted

## 2017-07-23 ENCOUNTER — Ambulatory Visit (INDEPENDENT_AMBULATORY_CARE_PROVIDER_SITE_OTHER): Payer: BLUE CROSS/BLUE SHIELD | Admitting: Family Medicine

## 2017-07-23 ENCOUNTER — Other Ambulatory Visit: Payer: Self-pay

## 2017-07-23 ENCOUNTER — Encounter: Payer: Self-pay | Admitting: Family Medicine

## 2017-07-23 VITALS — BP 120/80 | HR 68 | Ht 71.0 in | Wt 220.0 lb

## 2017-07-23 DIAGNOSIS — M71161 Other infective bursitis, right knee: Secondary | ICD-10-CM

## 2017-07-23 NOTE — Telephone Encounter (Signed)
Called patient due to result from fluid drawn from knee area yesterday.  Result reviewed by dr Mayford Knife.  He said that patient should continue the cephalexin and follow up with ortho in next few days.  I called patient and informed of instructions and told him he should be seen by ortho by end of week.  He will call them now.  I also advised to return if he gets worse at all.

## 2017-07-23 NOTE — Progress Notes (Signed)
Name: Sean Barajas   MRN: [id number]    DOB: 1956-10-21   Date:07/23/2017       Progress Note  Subjective  Chief Complaint  Chief Complaint  Patient presents with  . Follow-up    R) knee is still warm/ WBC in body fluid culture is over 2,000- needs to go back to ED for further eval    Knee Pain   The incident occurred 3 to 5 days ago. There was no injury mechanism. The pain is present in the right knee. The quality of the pain is described as aching. The pain is moderate. Associated symptoms include an inability to bear weight and a loss of motion. Pertinent negatives include no loss of sensation, muscle weakness, numbness or tingling. He reports no foreign bodies present. Treatments tried: cephalexin. The treatment provided mild relief.    No problem-specific Assessment & Plan notes found for this encounter.   Past Medical History:  Diagnosis Date  . Anxiety   . Coronary artery disease   . Hyperlipidemia   . Hypertension   . Parkinson's disease (HCC)   . Prediabetes     Past Surgical History:  Procedure Laterality Date  . APPENDECTOMY    . CARDIAC CATHETERIZATION    . CAROTID STENT  07/2015  . CHOLECYSTECTOMY    . CORONARY ANGIOPLASTY    . CORONARY ARTERY BYPASS GRAFT    . CORONARY STENT INTERVENTION N/A 01/30/2017   Procedure: Coronary Stent Intervention;  Surgeon: Marcina Millard, MD;  Location: [facility];  Service: Cardiovascular;  Laterality: N/A;  . CORONARY STENT INTERVENTION N/A 06/13/2017   Procedure: Coronary Stent Intervention;  Surgeon: Iran Ouch, MD;  Location: [facility];  Service: Cardiovascular;  Laterality: N/A;  . EYE SURGERY Bilateral   . LEFT HEART CATH AND CORS/GRAFTS ANGIOGRAPHY N/A 01/30/2017   Procedure: Left Heart Cath and Cors/Grafts Angiography and possible PCI;  Surgeon: Marcina Millard, MD;  Location: [facility];  Service: Cardiovascular;  Laterality: N/A;  . LEFT HEART CATH AND CORS/GRAFTS  ANGIOGRAPHY N/A 06/13/2017   Procedure: Left Heart Cath and Cors/Grafts Angiography;  Surgeon: Lamar Blinks, MD;  Location: [facility];  Service: Cardiovascular;  Laterality: N/A;  . NASAL SEPTUM SURGERY    . TONSILLECTOMY      Family History  Problem Relation Age of Onset  . Diabetes Father   . Diabetes Brother     Social History   Social History  . Marital status: Single    Spouse name: N/A  . Number of children: N/A  . Years of education: N/A   Occupational History  . self employed     toe truck driver   Social History Main Topics  . Smoking status: Never Smoker  . Smokeless tobacco: Never Used  . Alcohol use 0.0 oz/week     Comment: 1 glass wine/month  . Drug use: Yes     Comment: hemp oil  . Sexual activity: Not on file   Other Topics Concern  . Not on file   Social History Narrative  . No narrative on file    Allergies  Allergen Reactions  . Iodinated Diagnostic Agents Nausea Only and Other (See Comments)    Reaction:  Fever   . Sulfa Antibiotics Rash    Outpatient Medications Prior to Visit  Medication Sig Dispense Refill  . acetaminophen (TYLENOL) 500 MG tablet Take 1,000 mg by mouth every 6 (six) hours as needed for mild pain.    Marland Kitchen  albuterol (PROVENTIL HFA;VENTOLIN HFA) 108 (90 BASE) MCG/ACT inhaler Inhale 2 puffs into the lungs every 6 (six) hours as needed for wheezing or shortness of breath.    . ALPRAZolam (XANAX) 0.5 MG tablet Take 0.5 mg by mouth 3 (three) times daily. Dr Malvin Johns    . ARTIFICIAL TEAR OP Apply 1 drop to eye as needed (for dry eyes).    Marland Kitchen aspirin EC 81 MG tablet Take 81 mg by mouth daily.    . carbidopa-levodopa (SINEMET CR) 50-200 MG tablet Take 1 tablet by mouth at bedtime. 90 tablet 1  . carvedilol (COREG) 6.25 MG tablet Take 6.25 mg by mouth 2 (two) times daily.     . cephALEXin (KEFLEX) 250 MG capsule Take 1 capsule (250 mg total) by mouth 4 (four) times daily. 40 capsule 0  . empagliflozin (JARDIANCE) 10 MG TABS  tablet Take 10 mg by mouth daily. 30 tablet 11  . glucose blood test strip 1 each by Other route daily. And lancets 1/day 100 each 12  . ibuprofen (ADVIL,MOTRIN) 800 MG tablet Take 1 tablet (800 mg total) by mouth every 8 (eight) hours as needed. 30 tablet 0  . isosorbide mononitrate (IMDUR) 30 MG 24 hr tablet Take 1 tablet (30 mg total) by mouth daily. 30 tablet 0  . lisinopril (ZESTRIL) 2.5 MG tablet Take 1 tablet (2.5 mg total) by mouth daily. 30 tablet 0  . mupirocin ointment (BACTROBAN) 2 % Apply 1 application topically 2 (two) times daily. 22 g 0  . neomycin-polymyxin-hydrocortisone (CORTISPORIN) 3.5-10000-1 OTIC suspension Place 3 drops into both ears 4 (four) times daily. 10 mL 0  . NEUPRO 4 MG/24HR APPLY 1 PATCH DAILY. 30 patch 4  . nitroGLYCERIN (NITROSTAT) 0.4 MG SL tablet Place 0.4 mg under the tongue every 5 (five) minutes as needed for chest pain.    . phenol (CHLORASEPTIC) 1.4 % LIQD Use as directed 1 spray in the mouth or throat as needed for throat irritation / pain.    . rosuvastatin (CRESTOR) 20 MG tablet Take 20 mg by mouth at bedtime.    . ticagrelor (BRILINTA) 90 MG TABS tablet Take 90 mg by mouth every 12 (twelve) hours.    . valACYclovir (VALTREX) 500 MG tablet Take 500 mg by mouth 2 (two) times daily.    . carbidopa-levodopa (SINEMET IR) 25-100 MG tablet Take 2 tablets by mouth 6 (six) times daily. (Patient taking differently: 3/2/3/2/2) 1080 tablet 1  . amoxicillin-clavulanate (AUGMENTIN) 875-125 MG tablet Take 1 tablet by mouth 2 (two) times daily. 20 tablet 0   No facility-administered medications prior to visit.     Review of Systems  Constitutional: Negative for chills, fever, malaise/fatigue and weight loss.  HENT: Negative for ear discharge, ear pain and sore throat.   Eyes: Negative for blurred vision.  Respiratory: Negative for cough, sputum production, shortness of breath and wheezing.   Cardiovascular: Negative for chest pain, palpitations and leg swelling.   Gastrointestinal: Negative for abdominal pain, blood in stool, constipation, diarrhea, heartburn, melena and nausea.  Genitourinary: Negative for dysuria, frequency, hematuria and urgency.  Musculoskeletal: Negative for back pain, joint pain, myalgias and neck pain.  Skin: Negative for rash.  Neurological: Negative for dizziness, tingling, sensory change, focal weakness, numbness and headaches.  Endo/Heme/Allergies: Negative for environmental allergies and polydipsia. Does not bruise/bleed easily.  Psychiatric/Behavioral: Negative for depression and suicidal ideas. The patient is not nervous/anxious and does not have insomnia.      Objective  Vitals:   07/23/17  1533  BP: 120/80  Pulse: 68  Weight: 220 lb (99.8 kg)  Height: 5\' 11"  (1.803 m)    Physical Exam  Musculoskeletal:       Right knee: He exhibits decreased range of motion and swelling. Tenderness found. Patellar tendon tenderness noted.  Nursing note and vitals reviewed.     Assessment & Plan  Problem List Items Addressed This Visit    None    Visit Diagnoses    Infected prepatellar bursa, right    -  Primary   increase dosage of cephalexin/to seeortho tomorrow   Relevant Orders   CBC with Differential/Platelet      No orders of the defined types were placed in this encounter.  Got appt moved up to see the PA Everlean Alstrom) at [facility], 07/24/17 @ 10:30.   Dr. [facility]  07/23/17

## 2017-07-23 NOTE — Patient Instructions (Signed)
Prepatellar Bursitis Prepatellar bursitis is inflammation of the prepatellar bursa. The prepatellar bursa is a fluid-filled sac that cushions the kneecap (patella). Prepatellar bursitis happens when fluid builds up in the this sac and causes it to get larger. The condition causes knee pain. What are the causes? This condition may be caused by:  Constant pressure on the knees from kneeling.  A hit to the knee.  Falling on the knee.  A bacterial infection.  Moving the knee often in a forceful way.  What increases the risk? This condition is more likely to develop in:  People who play a sport that involves a risk for falls on the knee or hard hits (blows) to the knee. These sports include: ? Football. ? Wrestling. ? Basketball. ? Soccer.  People who have to kneel for long periods of time, such as roofers, plumbers, and gardeners.  People with another inflammatory condition, such as gout or rheumatoid arthritis.  What are the signs or symptoms? The most common symptom of this condition is knee pain that gets better with rest. Other symptoms include:  Swelling on the front of the kneecap.  Warmth in the knee.  Tenderness with activity.  Redness in the knee.  Inability to bend the knee or to kneel.  How is this diagnosed? This condition may be diagnosed based on:  Your symptoms.  Your medical history.  A physical exam. During the exam, your provider will compare your knees and check for tenderness and pain when moving your knee. Your health care provider may also use a needle to remove fluid from the bursa to help diagnose an infection.  Tests, such as: ? A blood test that checks for infection. ? X-rays. These may be taken to check the structure of the patella. ? MRI or ultrasound. These may be done to check for swelling and fluid buildup in the bursa.  How is this treated? This condition may be treated by:  Resting the knee.  Applying ice to the  knee.  Medicine, such as: ? Nonsteroidal anti-inflammatory drugs (NSAIDs). These can help to reduce pain and swelling. ? Antibiotic medicines. These may be needed if you have an infection. ? Steroid medicines. These may be prescribed if other treatments are not helping.  Raising (elevating) the knee while resting.  Doing strengthening and stretching exercises (physical therapy). These may be recommended after pain and swelling improve.  Having a procedure to remove fluid from the bursa. This may be done if other treatment is not helping.  Having surgery to remove the bursa. This may be done if you have a severe infection or if the condition keeps coming back after treatment.  Follow these instructions at home: Medicines  Take over-the-counter and prescription medicines only as told by your health care provider.  If you were prescribed an antibiotic medicine, take it as told by your health care provider. Do not stop taking the antibiotic even if you start to feel better. Managing pain, stiffness, and swelling  If directed, apply ice to your knee. ? Put ice in a plastic bag. ? Place a towel between your skin and the bag. ? Leave the ice on for 20 minutes, 2-3 times a day.  Elevate your knee above the level of your heart while you are sitting or lying down. Driving  Do not drive or operate heavy machinery while taking prescription pain medicine.  Ask your health care provider when it is safe fpr you to drive. Activity  Rest your knee.    Avoid activities that cause pain.  Return to your normal activities as told by your health care provider. Ask your health care provider what activities are safe for you.  Do exercises as told by your health care provider. General instructions  Do not use the injured limb to support your body weight until your health care provider says that you can.  Do not use any tobacco products, such as cigarettes, chewing tobacco, and e-cigarettes.  Tobacco can delay bone healing. If you need help quitting, ask your health care provider.  Keep all follow-up visits as told by your health care provider. This is important. How is this prevented?  Warm up and stretch before being active.  Cool down and stretch after being active.  Give your body time to rest between periods of activity.  Make sure to use equipment that fits you.  Be safe and responsible while being active to avoid falls.  Do at least 150 minutes of moderate-intensity exercise each week, such as brisk walking or water aerobics.  Maintain physical fitness, including: ? Strength. ? Flexibility. ? Cardiovascular fitness. ? Endurance. Contact a health care provider if:  Your symptoms do not improve.  Your symptoms get worse.  Your symptoms keep coming back after treatment.  You develop a fever and have warmth, redness, and swelling over your knee. This information is not intended to replace advice given to you by your health care provider. Make sure you discuss any questions you have with your health care provider. Document Released: 11/19/2005 Document Revised: 07/24/2016 Document Reviewed: 08/19/2015 Elsevier Interactive Patient Education  2018 [employer]  

## 2017-07-24 ENCOUNTER — Other Ambulatory Visit
Admission: RE | Admit: 2017-07-24 | Discharge: 2017-07-24 | Disposition: A | Discharge status: Home / Self Care | Payer: BLUE CROSS/BLUE SHIELD | Source: Ambulatory Visit | Attending: Unknown Physician Specialty | Admitting: Unknown Physician Specialty

## 2017-07-24 DIAGNOSIS — M7041 Prepatellar bursitis, right knee: Secondary | ICD-10-CM | POA: Diagnosis not present

## 2017-07-24 LAB — BODY FLUID CULTURE: Special Requests: NORMAL

## 2017-07-24 LAB — CBC WITH DIFFERENTIAL/PLATELET
BASOS ABS: 0 10*3/uL (ref 0.0–0.2)
Basos: 0 %
EOS (ABSOLUTE): 0.1 10*3/uL (ref 0.0–0.4)
EOS: 3 %
Hematocrit: 38.9 % (ref 37.5–51.0)
Hemoglobin: 13.4 g/dL (ref 13.0–17.7)
Immature Grans (Abs): 0 10*3/uL (ref 0.0–0.1)
Immature Granulocytes: 0 %
LYMPHS ABS: 0.6 10*3/uL — AB (ref 0.7–3.1)
LYMPHS: 11 %
MCH: 31.8 pg (ref 26.6–33.0)
MCHC: 34.4 g/dL (ref 31.5–35.7)
MCV: 92 fL (ref 79–97)
MONOCYTES: 10 %
Monocytes Absolute: 0.5 10*3/uL (ref 0.1–0.9)
Neutrophils Absolute: 4 10*3/uL (ref 1.4–7.0)
Neutrophils: 76 %
PLATELETS: 133 10*3/uL — AB (ref 150–379)
RBC: 4.21 x10E6/uL (ref 4.14–5.80)
RDW: 14.5 % (ref 12.3–15.4)
WBC: 5.3 10*3/uL (ref 3.4–10.8)

## 2017-07-25 NOTE — Progress Notes (Addendum)
ED cultures     Patient had body fluid taken from knee. Results of the body fluid culture are the following results:   Report Status 07/24/2017 FINAL   Organism ID, Bacteria STAPHYLOCOCCUS AUREUS  Culture & Susceptibility   STAPHYLOCOCCUS AUREUS   Antibiotic Sensitivity Microscan Status  CIPROFLOXACIN Sensitive <=0.5 SENSITIVE Final  Method: MIC  CLINDAMYCIN Sensitive <=0.25 SENSITIVE Final  Method: MIC  ERYTHROMYCIN Resistant >=8 RESISTANT Final  Method: MIC  GENTAMICIN Sensitive <=0.5 SENSITIVE Final  Method: MIC  Inducible Clindamycin Sensitive NEGATIVE Final  Method: MIC  OXACILLIN Resistant >=4 RESISTANT Final  Method: MIC  RIFAMPIN Sensitive <=0.5 SENSITIVE Final  Method: MIC  TETRACYCLINE Sensitive <=1 SENSITIVE Final  Method: MIC  TRIMETH/SULFA Sensitive <=10 SENSITIVE Final  Method: MIC  VANCOMYCIN Sensitive <=0.5 SENSITIVE Final  Method: MIC  Comments STAPHYLOCOCCUS AUREUS (MIC)   RARE STAPHYLOCOCCUS AUREUS           Prior to culture results, patient was discharged from ED with a RX for Keflex.  Patient was seen by PA Everlean Alstrom @ [facility] in [place] on 8/22. Will fax over information to PA's office.   Demetrius Charity, PharmD   ADD: Sherron Monday with [facility], per representative, patient did not show up for appt due to insurance issues. Spoke with MD Don Perking and MD would like to give patient a prescription for Clindamycin.  Called in Clindamycin 300 mg PO q6 hours x 7 days to [facility] in [place], [place].  Demetrius Charity, PharmD

## 2017-07-26 ENCOUNTER — Other Ambulatory Visit: Payer: Self-pay

## 2017-07-28 LAB — BODY FLUID CULTURE: Culture: NO GROWTH

## 2017-07-31 ENCOUNTER — Emergency Department: Payer: BLUE CROSS/BLUE SHIELD

## 2017-07-31 ENCOUNTER — Other Ambulatory Visit: Payer: Self-pay

## 2017-07-31 ENCOUNTER — Observation Stay
Admission: EM | Admit: 2017-07-31 | Discharge: 2017-08-01 | Disposition: A | Discharge status: Home / Self Care | Payer: BLUE CROSS/BLUE SHIELD | Attending: Internal Medicine | Admitting: Internal Medicine

## 2017-07-31 DIAGNOSIS — Z79899 Other long term (current) drug therapy: Secondary | ICD-10-CM | POA: Diagnosis not present

## 2017-07-31 DIAGNOSIS — K219 Gastro-esophageal reflux disease without esophagitis: Secondary | ICD-10-CM | POA: Insufficient documentation

## 2017-07-31 DIAGNOSIS — I2510 Atherosclerotic heart disease of native coronary artery without angina pectoris: Secondary | ICD-10-CM | POA: Diagnosis not present

## 2017-07-31 DIAGNOSIS — Z8249 Family history of ischemic heart disease and other diseases of the circulatory system: Secondary | ICD-10-CM | POA: Insufficient documentation

## 2017-07-31 DIAGNOSIS — Z955 Presence of coronary angioplasty implant and graft: Secondary | ICD-10-CM | POA: Insufficient documentation

## 2017-07-31 DIAGNOSIS — E785 Hyperlipidemia, unspecified: Secondary | ICD-10-CM | POA: Insufficient documentation

## 2017-07-31 DIAGNOSIS — R079 Chest pain, unspecified: Secondary | ICD-10-CM | POA: Diagnosis present

## 2017-07-31 DIAGNOSIS — I10 Essential (primary) hypertension: Secondary | ICD-10-CM | POA: Diagnosis not present

## 2017-07-31 DIAGNOSIS — Z833 Family history of diabetes mellitus: Secondary | ICD-10-CM | POA: Diagnosis not present

## 2017-07-31 DIAGNOSIS — E119 Type 2 diabetes mellitus without complications: Secondary | ICD-10-CM | POA: Diagnosis not present

## 2017-07-31 DIAGNOSIS — K222 Esophageal obstruction: Secondary | ICD-10-CM | POA: Diagnosis not present

## 2017-07-31 DIAGNOSIS — Z7982 Long term (current) use of aspirin: Secondary | ICD-10-CM | POA: Insufficient documentation

## 2017-07-31 DIAGNOSIS — R61 Generalized hyperhidrosis: Secondary | ICD-10-CM

## 2017-07-31 DIAGNOSIS — I252 Old myocardial infarction: Secondary | ICD-10-CM | POA: Insufficient documentation

## 2017-07-31 DIAGNOSIS — Z882 Allergy status to sulfonamides status: Secondary | ICD-10-CM | POA: Diagnosis not present

## 2017-07-31 DIAGNOSIS — Z91041 Radiographic dye allergy status: Secondary | ICD-10-CM | POA: Diagnosis not present

## 2017-07-31 DIAGNOSIS — R001 Bradycardia, unspecified: Secondary | ICD-10-CM | POA: Diagnosis not present

## 2017-07-31 DIAGNOSIS — R0789 Other chest pain: Principal | ICD-10-CM | POA: Insufficient documentation

## 2017-07-31 DIAGNOSIS — G20 Parkinson's disease: Secondary | ICD-10-CM | POA: Insufficient documentation

## 2017-07-31 DIAGNOSIS — R1310 Dysphagia, unspecified: Secondary | ICD-10-CM

## 2017-07-31 DIAGNOSIS — L03115 Cellulitis of right lower limb: Secondary | ICD-10-CM

## 2017-07-31 LAB — BASIC METABOLIC PANEL
ANION GAP: 6 (ref 5–15)
BUN: 17 mg/dL (ref 6–20)
CO2: 28 mmol/L (ref 22–32)
Calcium: 9.3 mg/dL (ref 8.9–10.3)
Chloride: 102 mmol/L (ref 101–111)
Creatinine, Ser: 1 mg/dL (ref 0.61–1.24)
GLUCOSE: 154 mg/dL — AB (ref 65–99)
POTASSIUM: 4.3 mmol/L (ref 3.5–5.1)
Sodium: 136 mmol/L (ref 135–145)

## 2017-07-31 LAB — CBC
HCT: 37.9 % — ABNORMAL LOW (ref 40.0–52.0)
HEMOGLOBIN: 13.4 g/dL (ref 13.0–18.0)
MCH: 32.2 pg (ref 26.0–34.0)
MCHC: 35.3 g/dL (ref 32.0–36.0)
MCV: 91 fL (ref 80.0–100.0)
PLATELETS: 196 10*3/uL (ref 150–440)
RBC: 4.16 MIL/uL — AB (ref 4.40–5.90)
RDW: 14.7 % — AB (ref 11.5–14.5)
WBC: 4 10*3/uL (ref 3.8–10.6)

## 2017-07-31 LAB — TROPONIN I
Troponin I: <0.03 ng/mL (ref <0.03)
Troponin I: <0.03 ng/mL (ref <0.03)

## 2017-07-31 LAB — GLUCOSE, CAPILLARY: Glucose-Capillary: 136 mg/dL — ABNORMAL HIGH (ref 65–99)

## 2017-07-31 LAB — BRAIN NATRIURETIC PEPTIDE: B Natriuretic Peptide: 169 pg/mL — ABNORMAL HIGH (ref 0.0–100.0)

## 2017-07-31 MED ORDER — NITROGLYCERIN 0.4 MG SL SUBL
0.4000 mg | SUBLINGUAL_TABLET | SUBLINGUAL | Status: DC | PRN
  Administered 2017-07-31: 0.4 mg via SUBLINGUAL
  Filled 2017-07-31: qty 1

## 2017-07-31 MED ORDER — ALPRAZOLAM 0.5 MG PO TABS
0.5000 mg | ORAL_TABLET | Freq: Three times a day (TID) | ORAL | Status: DC
  Administered 2017-07-31 – 2017-08-01 (×2): 0.5 mg via ORAL
  Filled 2017-07-31 (×2): qty 1

## 2017-07-31 MED ORDER — LISINOPRIL 5 MG PO TABS
2.5000 mg | ORAL_TABLET | Freq: Every day | ORAL | Status: DC
  Administered 2017-08-01: 2.5 mg via ORAL
  Filled 2017-07-31: qty 1

## 2017-07-31 MED ORDER — ENOXAPARIN SODIUM 40 MG/0.4ML ~~LOC~~ SOLN
40.0000 mg | SUBCUTANEOUS | Status: DC
  Administered 2017-07-31: 40 mg via SUBCUTANEOUS
  Filled 2017-07-31: qty 0.4

## 2017-07-31 MED ORDER — CLINDAMYCIN HCL 150 MG PO CAPS
300.0000 mg | ORAL_CAPSULE | Freq: Four times a day (QID) | ORAL | Status: DC
  Administered 2017-07-31 – 2017-08-01 (×2): 300 mg via ORAL
  Filled 2017-07-31 (×5): qty 2

## 2017-07-31 MED ORDER — FAMOTIDINE IN NACL 20-0.9 MG/50ML-% IV SOLN
20.0000 mg | Freq: Two times a day (BID) | INTRAVENOUS | Status: DC
  Administered 2017-07-31 – 2017-08-01 (×2): 20 mg via INTRAVENOUS
  Filled 2017-07-31 (×3): qty 50

## 2017-07-31 MED ORDER — OXYCODONE HCL 5 MG PO TABS: 5.0000 mg | ORAL_TABLET | ORAL | Status: DC | PRN

## 2017-07-31 MED ORDER — VANCOMYCIN HCL 10 G IV SOLR
2000.0000 mg | Freq: Once | INTRAVENOUS | Status: AC
  Administered 2017-07-31: 2000 mg via INTRAVENOUS
  Filled 2017-07-31: qty 2000

## 2017-07-31 MED ORDER — ACETAMINOPHEN 650 MG RE SUPP: 650.0000 mg | Freq: Four times a day (QID) | RECTAL | Status: DC | PRN

## 2017-07-31 MED ORDER — CARBIDOPA-LEVODOPA 25-100 MG PO TABS
2.0000 | ORAL_TABLET | Freq: Every day | ORAL | Status: DC
  Administered 2017-07-31 – 2017-08-01 (×4): 2 via ORAL
  Filled 2017-07-31 (×7): qty 2

## 2017-07-31 MED ORDER — CARBIDOPA-LEVODOPA ER 50-200 MG PO TBCR
1.0000 | EXTENDED_RELEASE_TABLET | Freq: Every day | ORAL | Status: DC
  Administered 2017-07-31: 1 via ORAL
  Filled 2017-07-31 (×2): qty 1

## 2017-07-31 MED ORDER — INSULIN ASPART 100 UNIT/ML ~~LOC~~ SOLN: 0.0000 [IU] | Freq: Every day | SUBCUTANEOUS | Status: DC

## 2017-07-31 MED ORDER — PROMETHAZINE HCL 25 MG PO TABS
25.0000 mg | ORAL_TABLET | Freq: Three times a day (TID) | ORAL | Status: DC | PRN
  Filled 2017-07-31: qty 1

## 2017-07-31 MED ORDER — INSULIN ASPART 100 UNIT/ML ~~LOC~~ SOLN
0.0000 [IU] | Freq: Three times a day (TID) | SUBCUTANEOUS | Status: DC
  Administered 2017-08-01: 1 [IU] via SUBCUTANEOUS
  Filled 2017-07-31: qty 1

## 2017-07-31 MED ORDER — GI COCKTAIL ~~LOC~~
30.0000 mL | Freq: Once | ORAL | Status: AC
  Administered 2017-07-31: 30 mL via ORAL
  Filled 2017-07-31: qty 30

## 2017-07-31 MED ORDER — VANCOMYCIN HCL 10 G IV SOLR
2000.0000 mg | Freq: Once | INTRAVENOUS | Status: DC
  Filled 2017-07-31: qty 2000

## 2017-07-31 MED ORDER — VALACYCLOVIR HCL 500 MG PO TABS
500.0000 mg | ORAL_TABLET | Freq: Two times a day (BID) | ORAL | Status: DC | PRN
  Filled 2017-07-31: qty 1

## 2017-07-31 MED ORDER — CARVEDILOL 6.25 MG PO TABS
6.2500 mg | ORAL_TABLET | Freq: Two times a day (BID) | ORAL | Status: DC
  Administered 2017-07-31 – 2017-08-01 (×2): 6.25 mg via ORAL
  Filled 2017-07-31 (×2): qty 1

## 2017-07-31 MED ORDER — TICAGRELOR 90 MG PO TABS
90.0000 mg | ORAL_TABLET | Freq: Two times a day (BID) | ORAL | Status: DC
  Administered 2017-07-31 – 2017-08-01 (×2): 90 mg via ORAL
  Filled 2017-07-31 (×2): qty 1

## 2017-07-31 MED ORDER — ALBUTEROL SULFATE (2.5 MG/3ML) 0.083% IN NEBU: 2.5000 mg | INHALATION_SOLUTION | Freq: Four times a day (QID) | RESPIRATORY_TRACT | Status: DC | PRN

## 2017-07-31 MED ORDER — ACETAMINOPHEN 325 MG PO TABS
650.0000 mg | ORAL_TABLET | Freq: Four times a day (QID) | ORAL | Status: DC | PRN
  Administered 2017-07-31: 650 mg via ORAL
  Filled 2017-07-31: qty 2

## 2017-07-31 MED ORDER — ISOSORBIDE MONONITRATE ER 60 MG PO TB24
30.0000 mg | ORAL_TABLET | Freq: Every day | ORAL | Status: DC
  Administered 2017-08-01: 30 mg via ORAL
  Filled 2017-07-31: qty 1

## 2017-07-31 MED ORDER — ROSUVASTATIN CALCIUM 20 MG PO TABS
20.0000 mg | ORAL_TABLET | Freq: Every day | ORAL | Status: DC
  Administered 2017-07-31: 20 mg via ORAL
  Filled 2017-07-31: qty 1
  Filled 2017-07-31: qty 2
  Filled 2017-07-31: qty 1

## 2017-07-31 MED ORDER — ASPIRIN EC 81 MG PO TBEC
81.0000 mg | DELAYED_RELEASE_TABLET | Freq: Every day | ORAL | Status: DC
  Administered 2017-08-01: 81 mg via ORAL
  Filled 2017-07-31: qty 1

## 2017-07-31 NOTE — ED Provider Notes (Signed)
[facility] Emergency Department Provider Note  ____________________________________________  Time seen: Approximately 5:03 PM  I have reviewed the triage vital signs and the nursing notes.   HISTORY  Chief Complaint Chest Pain    HPI Sean Barajas is a 61 y.o. male with a history of Parkinson's disease, CAD status post MI and stent placement in 03/18 and 07/18 presenting with chest pain. The patient reports that at 11 AM he developed a "gas like" pain in the center of the chest that did not radiate. He had associated diaphoresis but no nausea or vomiting, palpitations, or shortness of breath. His symptoms were exacerbated when he ate a large meal. He took a nitroglycerin which took his pain away "just for a few minutes" and then it came back. This feels similar to his prior MI. The patient received 4 low-dose aspirins with EMS and had a significant improvement in his pain with that.  Of note, the patient is also recently been treated for a right knee bursitis and has had 3 rounds of outpatient and a for this. However, he notes now that he has some pretibial erythema and warmth on the right lower extremity.  Past Medical History:  Diagnosis Date  . Anxiety   . Coronary artery disease   . Hyperlipidemia   . Hypertension   . Parkinson's disease (HCC)   . Prediabetes     Patient Active Problem List   Diagnosis Date Noted  . Diabetes (HCC) 06/26/2017  . CAD (coronary artery disease) 01/30/2017  . Unstable angina (HCC) 01/29/2017  . Aortic atherosclerosis (HCC) 08/28/2016    Past Surgical History:  Procedure Laterality Date  . APPENDECTOMY    . CARDIAC CATHETERIZATION    . CAROTID STENT  07/2015  . CHOLECYSTECTOMY    . CORONARY ANGIOPLASTY    . CORONARY ARTERY BYPASS GRAFT    . CORONARY STENT INTERVENTION N/A 01/30/2017   Procedure: Coronary Stent Intervention;  Surgeon: Marcina Millard, MD;  Location: [facility] INVASIVE CV LAB;  Service:  Cardiovascular;  Laterality: N/A;  . CORONARY STENT INTERVENTION N/A 06/13/2017   Procedure: Coronary Stent Intervention;  Surgeon: Iran Ouch, MD;  Location: [facility] INVASIVE CV LAB;  Service: Cardiovascular;  Laterality: N/A;  . EYE SURGERY Bilateral   . LEFT HEART CATH AND CORS/GRAFTS ANGIOGRAPHY N/A 01/30/2017   Procedure: Left Heart Cath and Cors/Grafts Angiography and possible PCI;  Surgeon: Marcina Millard, MD;  Location: [facility] INVASIVE CV LAB;  Service: Cardiovascular;  Laterality: N/A;  . LEFT HEART CATH AND CORS/GRAFTS ANGIOGRAPHY N/A 06/13/2017   Procedure: Left Heart Cath and Cors/Grafts Angiography;  Surgeon: Lamar Blinks, MD;  Location: [facility] INVASIVE CV LAB;  Service: Cardiovascular;  Laterality: N/A;  . NASAL SEPTUM SURGERY    . TONSILLECTOMY      Current Outpatient Rx  . Order #: [id number] Class: Historical Med  . Order #: [id number] Class: Historical Med  . Order #: [id number] Class: Historical Med  . Order #: [id number] Class: Historical Med  . Order #: [id number] Class: Historical Med  . Order #: [id number] Class: Normal  . Order #: [id number] Class: Normal  . Order #: [id number] Class: Historical Med  . Order #: [id number] Class: Normal  . Order #: [id number] Class: Normal  . Order #: [id number] Class: Print  . Order #: [id number] Class: Normal  . Order #: [id number] Class: Normal  . Order #: [id number] Class: Normal  . Order #: [id number] Class: Normal  . Order #: [id number] Class: Normal  . Order #: [id number] Class: Historical Med  . Order #: [id number] Class: Historical Med  .  Order #: [id number] Class: Historical Med  . Order #: [id number] Class: Historical Med  . Order #: [id number] Class: Historical Med    Allergies Iodinated diagnostic agents and Sulfa antibiotics  Family History  Problem Relation Age of Onset  . Diabetes Father   . Diabetes Brother     Social History Social History  Substance Use Topics  . Smoking status: Never Smoker  . Smokeless tobacco: Never Used  .  Alcohol use 0.0 oz/week     Comment: 1 glass wine/month    Review of Systems Constitutional: No fever/chills.No lightheadedness or syncope. Positive diaphoresis. Eyes: No visual changes. ENT: No sore throat. No congestion or rhinorrhea. Cardiovascular: Positive chest pain. Denies palpitations. Respiratory: Denies shortness of breath.  No cough. Gastrointestinal: No abdominal pain.  No nausea, no vomiting.  No diarrhea.  No constipation. Genitourinary: Negative for dysuria. Musculoskeletal: Negative for back pain. Right lower extremity warmth erythema and swelling. Skin: Negative for rash. Neurological: Negative for headaches. No focal numbness, tingling or weakness.     ____________________________________________   PHYSICAL EXAM:  VITAL SIGNS: ED Triage Vitals  Enc Vitals Group     BP 07/31/17 1653 (!) 171/86     Pulse Rate 07/31/17 1653 (!) 54     Resp 07/31/17 1653 19     Temp 07/31/17 1653 97.6 F (36.4 C)     Temp Source 07/31/17 1653 Oral     SpO2 07/31/17 1653 96 %     Weight 07/31/17 1651 222 lb 10.6 oz (101 kg)     Height 07/31/17 1651 5\' 11"  (1.803 m)     Head Circumference --      Peak Flow --      Pain Score 07/31/17 1651 6     Pain Loc --      Pain Edu? --      Excl. in GC? --     Constitutional: Alert and oriented. Chronically ill appearing with multiple parkinsonian but in no acute distress. Answers questions appropriately. Eyes: Conjunctivae are normal.  EOMI. occasionally, the patient rolls his eyes upwards. No scleral icterus. Head: Atraumatic. Nose: No congestion/rhinnorhea. Mouth/Throat: Mucous membranes are mildly dry.  Neck: No stridor.  Supple.  No JVD. No meningismus. Cardiovascular: Normal rate, regular rhythm. No murmurs, rubs or gallops.  Respiratory: Normal respiratory effort.  No accessory muscle use or retractions. Lungs CTAB.  No wheezes, rales or ronchi. Gastrointestinal: Soft, nontender and nondistended.  No guarding or rebound.  No  peritoneal signs. Musculoskeletal: Symmeric mild LE edema. No ttp in the calves or palpable cords.  Negative Homan's sign. Neurologic:  A&Ox3.  Speech is clear.  Face and smile are symmetric.  EOMI.  Moves all extremities well. The patient does have a parkinsonian tremor most prominent in his lower extremities. Skin:  Skin is warm, dry and intact. The patient has right lower extremity edema that is asymmetric compared to the left leg, with associated erythema on the anterior aspect of the leg that is also warm. Psychiatric: Mood and affect are normal. Speech and behavior are normal.  Normal judgement.  ____________________________________________   LABS (all labs ordered are listed, but only abnormal results are displayed)  Labs Reviewed  BASIC METABOLIC PANEL - Abnormal; Notable for the following:       Result Value   Glucose, Bld 154 (*)    All other components within normal limits  CBC - Abnormal; Notable for the following:    RBC 4.16 (*)    HCT 37.9 (*)  RDW 14.7 (*)    All other components within normal limits  BRAIN NATRIURETIC PEPTIDE - Abnormal; Notable for the following:    B Natriuretic Peptide 169.0 (*)    All other components within normal limits  TROPONIN I   ____________________________________________  EKG  ED ECG REPORT I, Rockne Menghini, the attending physician, personally viewed and interpreted this ECG.   Date: 07/31/2017  EKG Time: 1652  Rate: 66  Rhythm: normal sinus rhythm  Axis: normal  Intervals:none  ST&T Change: No STEMI  ____________________________________________  RADIOLOGY  Dg Chest 2 View  Result Date: 07/31/2017 CLINICAL DATA:  Mid chest pain. EXAM: CHEST  2 VIEW COMPARISON:  06/12/2017 FINDINGS: Postsurgical changes from CABG, stable. Cardiomediastinal silhouette is stably enlarged. Mediastinal contours appear intact. There is no evidence of focal airspace consolidation, pleural effusion or pneumothorax. Osseous structures are  without acute abnormality. Soft tissues are grossly normal. IMPRESSION: Mildly stably enlarged cardiac silhouette. No evidence of acute findings in the chest. Electronically Signed   By: Ted Mcalpine M.D.   On: 07/31/2017 18:06   US Venous Img Lower Unilateral Right  Result Date: 07/31/2017 CLINICAL DATA:  Pain and swelling of the right lower extremity. EXAM: RIGHT LOWER EXTREMITY VENOUS DOPPLER ULTRASOUND TECHNIQUE: Gray-scale sonography with graded compression, as well as color Doppler and duplex ultrasound were performed to evaluate the lower extremity deep venous systems from the level of the common femoral vein and including the common femoral, femoral, profunda femoral, popliteal and calf veins including the posterior tibial, peroneal and gastrocnemius veins when visible. The superficial great saphenous vein was also interrogated. Spectral Doppler was utilized to evaluate flow at rest and with distal augmentation maneuvers in the common femoral, femoral and popliteal veins. COMPARISON:  None. FINDINGS: Contralateral Common Femoral Vein: Respiratory phasicity is normal and symmetric with the symptomatic side. No evidence of thrombus. Normal compressibility. Common Femoral Vein: No evidence of thrombus. Normal compressibility, respiratory phasicity and response to augmentation. Saphenofemoral Junction: No evidence of thrombus. Normal compressibility and flow on color Doppler imaging. Profunda Femoral Vein: No evidence of thrombus. Normal compressibility and flow on color Doppler imaging. Femoral Vein: No evidence of thrombus. Normal compressibility, respiratory phasicity and response to augmentation. Popliteal Vein: No evidence of thrombus. Normal compressibility, respiratory phasicity and response to augmentation. Calf Veins: No evidence of thrombus. Normal compressibility and flow on color Doppler imaging. Superficial Great Saphenous Vein: No evidence of thrombus. Normal compressibility and flow on  color Doppler imaging. Venous Reflux:  None. Other Findings:  Soft tissue swelling of the calf. IMPRESSION: No evidence of DVT within the right lower extremity. Electronically Signed   By: Ted Mcalpine M.D.   On: 07/31/2017 18:49    ____________________________________________   PROCEDURES  Procedure(s) performed: None  Procedures  Critical Care performed: No ____________________________________________   INITIAL IMPRESSION / ASSESSMENT AND PLAN / ED COURSE  Pertinent labs & imaging results that were available during my care of the patient were reviewed by me and considered in my medical decision making (see chart for details).  60 y.o. male with a history of Parkinson's and CAD status post MI and stents on [place] presenting with chest pain similar to prior MI. Overall, the patient is hemodynamically stable. He has no acute findings on his cardiopulmonary examination. This patient's symptoms may be related to his GI tract, including GERD, but given his significant cardiac risk factors, we'll plan to admit him to the hospital for further evaluation and treatment. I will start with by giving  him nitroglycerin, and reevaluate his pain.  ----------------------------------------- 7:20 PM on 07/31/2017 -----------------------------------------  The patient's workup in the emergency department shows a negative troponin and no ischemic changes on his EKG. However, given his risk factors, we'll continue to monitor him as an inpatient for full cardiac evaluation. He does have a minimally elevated BNP but has no clinical or radiographic evidence of significant fluid overload. The patient's DVT study is negative,'s have ordered by myself and to give him IV antibiotics for his cellulitis given the failure of his outpatient antibiotics. At this time, the patient is stable for admission. He did tell me that he is having reflux at this time, described as a burning sensation in the center of the  chest with an associated fullness. I have ordered a GI cocktail for this. There are no obvious morphology changes on the monitor at this time.  ____________________________________________  FINAL CLINICAL IMPRESSION(S) / ED DIAGNOSES  Final diagnoses:  Cellulitis of right lower extremity  Chest pain, unspecified type  Diaphoresis         NEW MEDICATIONS STARTED DURING THIS VISIT:  New Prescriptions   No medications on file      Rockne Menghini, MD 07/31/17 [facility] [place]

## 2017-07-31 NOTE — ED Triage Notes (Signed)
Pt arrives to ED via [facility] from home for middle CP that began around 11am today. Pt took 2 nitro around 4p, EMS gave 324 ASA. Pt has hx of MI with stents placed in March and July this year. Pt is being treated for bursitis in R knee. States on 3rd round of antibiotics for it. R lower leg is swollen and red. EMS reports pt is taking amoxicillin for it. Pt has hx of parkinsons, DM, HTN. Pt arrives diaphoretic. Denies any other symptoms. States initially pain ws 8/10, now is 6/10 after ASA. Pt states he thought this was gas pains. States abd distention. Then pain radiated into chest.    Side note: pt eyes keep going up and to R but pt is awake and talking during this. EMS reports pt was doing same with them.

## 2017-07-31 NOTE — H&P (Signed)
[facility] [place] [facility]   PATIENT NAME: Sean Barajas    MR#:  [id number]  DATE OF BIRTH:  27-Dec-1955  DATE OF ADMISSION:  07/31/2017  PRIMARY CARE PHYSICIAN: Duanne Limerick, MD   REQUESTING/REFERRING PHYSICIAN: Dr Sharma Covert  CHIEF COMPLAINT:   Chief Complaint  Patient presents with  . Chest Pain    HISTORY OF PRESENT ILLNESS:  Sean Barajas  is a 61 y.o. male with a known history of Parkinson's, coronary artery disease. He presents with chest pains. He states the chest pains were bed 10 out 10 in intensity. It felt like in the center of his chest almost like a gas type feeling. Severe ache in the center of his chest. Now down to 5 out of 10 in intensity. Today he did drink 2 protein shakes and had a couple episodes of diarrhea. Did have some sweating with the chest pain. A GI cocktail in the ER made it feel a little bit better. Chest pain worse with food today. He took 2 old nitroglycerin today which did not help. He chewed some aspirin which made the pain feel a bit better. In the ER, his first troponin was negative EKG showed no acute changes. Hospitalist services were contacted for further evaluation. The patient had 2 stents in July.  PAST MEDICAL HISTORY:   Past Medical History:  Diagnosis Date  . Anxiety   . Coronary artery disease   . Hyperlipidemia   . Hypertension   . Parkinson's disease (HCC)   . Prediabetes     PAST SURGICAL HISTORY:   Past Surgical History:  Procedure Laterality Date  . APPENDECTOMY    . CARDIAC CATHETERIZATION    . CAROTID STENT  07/2015  . CHOLECYSTECTOMY    . CORONARY ANGIOPLASTY    . CORONARY ARTERY BYPASS GRAFT    . CORONARY STENT INTERVENTION N/A 01/30/2017   Procedure: Coronary Stent Intervention;  Surgeon: Marcina Millard, MD;  Location: [facility] INVASIVE CV LAB;  Service: Cardiovascular;  Laterality: N/A;  . CORONARY STENT INTERVENTION N/A 06/13/2017   Procedure: Coronary Stent Intervention;   Surgeon: Iran Ouch, MD;  Location: [facility] INVASIVE CV LAB;  Service: Cardiovascular;  Laterality: N/A;  . EYE SURGERY Bilateral   . LEFT HEART CATH AND CORS/GRAFTS ANGIOGRAPHY N/A 01/30/2017   Procedure: Left Heart Cath and Cors/Grafts Angiography and possible PCI;  Surgeon: Marcina Millard, MD;  Location: [facility] INVASIVE CV LAB;  Service: Cardiovascular;  Laterality: N/A;  . LEFT HEART CATH AND CORS/GRAFTS ANGIOGRAPHY N/A 06/13/2017   Procedure: Left Heart Cath and Cors/Grafts Angiography;  Surgeon: Lamar Blinks, MD;  Location: [facility] INVASIVE CV LAB;  Service: Cardiovascular;  Laterality: N/A;  . NASAL SEPTUM SURGERY    . TONSILLECTOMY      SOCIAL HISTORY:   Social History  Substance Use Topics  . Smoking status: Never Smoker  . Smokeless tobacco: Never Used  . Alcohol use No     Comment: 1 glass wine/month    FAMILY HISTORY:   Family History  Problem Relation Age of Onset  . Breast cancer Mother   . Diabetes Father   . CAD Father   . CVA Father   . Diabetes Brother     DRUG ALLERGIES:   Allergies  Allergen Reactions  . Iodinated Diagnostic Agents Nausea Only and Other (See Comments)    Reaction:  Fever   . Sulfa Antibiotics Rash    REVIEW OF SYSTEMS:  CONSTITUTIONAL: No fever. Positive for fatigue. Positive for  sweating. EYES: No blurred or double vision. Wears glasses. EARS, NOSE, AND THROAT: Positive for tinnitus when blood pressures up. No sore throat. On dysphagia diet. RESPIRATORY: No cough, shortness of breath, wheezing or hemoptysis.  CARDIOVASCULAR: Positive for chest pain, no orthopnea, edema.  GASTROINTESTINAL: No nausea, vomiting. Today 2 episodes of diarrhea. Slight epigastric abdominal pain. No blood in bowel movements. GENITOURINARY: No dysuria, hematuria.  ENDOCRINE: No polyuria, nocturia,  HEMATOLOGY: No anemia, easy bruising or bleeding SKIN: No rash or lesion. MUSCULOSKELETAL: No joint pain or arthritis.   NEUROLOGIC: No tingling,  numbness, weakness.  PSYCHIATRY: No anxiety or depression.   MEDICATIONS AT HOME:   Prior to Admission medications   Medication Sig Start Date End Date Taking? Authorizing Provider  albuterol (PROVENTIL HFA;VENTOLIN HFA) 108 (90 BASE) MCG/ACT inhaler Inhale 2 puffs into the lungs every 6 (six) hours as needed for wheezing or shortness of breath.   Yes [provider]  ALPRAZolam Prudy Feeler) 0.5 MG tablet Take 0.5 mg by mouth 3 (three) times daily. Dr Malvin Johns   Yes [provider]  ARTIFICIAL TEAR OP Apply 1 drop to eye as needed (for dry eyes).   Yes [provider]  aspirin EC 81 MG tablet Take 81 mg by mouth daily.   Yes [provider]  carbidopa-levodopa (SINEMET CR) 50-200 MG tablet Take 1 tablet by mouth at bedtime. 12/28/16  Yes Tat, Octaviano Batty, DO  carbidopa-levodopa (SINEMET IR) 25-100 MG tablet Take 2 tablets by mouth 6 (six) times daily. Patient taking differently: 3/2/3/2/2 12/28/16  Yes Tat, Octaviano Batty, DO  carvedilol (COREG) 6.25 MG tablet Take 6.25 mg by mouth 2 (two) times daily.    Yes [provider]  clindamycin (CLEOCIN) 300 MG capsule Take 1 capsule by mouth 4 (four) times daily. Take 1 capsule by mouth 4 (four) times daily for 7 days. 07/25/17  Yes [provider]  empagliflozin (JARDIANCE) 10 MG TABS tablet Take 10 mg by mouth daily. 06/27/17  Yes Romero Belling, MD  isosorbide mononitrate (IMDUR) 30 MG 24 hr tablet Take 1 tablet (30 mg total) by mouth daily. 01/31/17  Yes Delfino Lovett, MD  lisinopril (ZESTRIL) 2.5 MG tablet Take 1 tablet (2.5 mg total) by mouth daily. 01/31/17  Yes Delfino Lovett, MD  neomycin-polymyxin-hydrocortisone (CORTISPORIN) 3.5-10000-1 OTIC suspension Place 3 drops into both ears 4 (four) times daily. 06/17/17  Yes Duanne Limerick, MD  nitroGLYCERIN (NITROSTAT) 0.4 MG SL tablet Place 0.4 mg under the tongue every 5 (five) minutes as needed for chest pain.   Yes [provider]  phenol (CHLORASEPTIC) 1.4 %  LIQD Use as directed 1 spray in the mouth or throat as needed for throat irritation / pain.   Yes [provider]  promethazine (PHENERGAN) 25 MG tablet Take 25 mg by mouth every 8 (eight) hours as needed for nausea or vomiting.   Yes [provider]  rosuvastatin (CRESTOR) 20 MG tablet Take 20 mg by mouth at bedtime.   Yes [provider]  ticagrelor (BRILINTA) 90 MG TABS tablet Take 90 mg by mouth every 12 (twelve) hours.   Yes [provider]  valACYclovir (VALTREX) 500 MG tablet Take 500 mg by mouth 2 (two) times daily.   Yes [provider]  acetaminophen (TYLENOL) 500 MG tablet Take 500 mg by mouth every 6 (six) hours as needed for mild pain.     [provider]  glucose blood test strip 1 each by Other route daily. And lancets 1/day  06/25/17   Romero Belling, MD      VITAL SIGNS:  Blood pressure (!) 160/84, pulse (!) 55, temperature 97.6 F (36.4 C), temperature source Oral, resp. rate 11, height 5\' 11"  (1.803 m), weight 101 kg (222 lb 10.6 oz), SpO2 97 %.  PHYSICAL EXAMINATION:  GENERAL:  61 y.o.-year-old patient lying in the bed with no acute distress.  EYES: Pupils equal, round, reactive to light and accommodation. No scleral icterus. Extraocular muscles intact.  HEENT: Head atraumatic, normocephalic. Oropharynx and nasopharynx clear.  NECK:  Supple, no jugular venous distention. No thyroid enlargement, no tenderness.  LUNGS: Normal breath sounds bilaterally, no wheezing, rales,rhonchi or crepitation. No use of accessory muscles of respiration.  CARDIOVASCULAR: S1, S2 normal. No murmurs, rubs, or gallops.  ABDOMEN: Soft, nontender, nondistended. Bowel sounds present. No organomegaly or mass.  EXTREMITIES: Trace edema, no cyanosis, or clubbing.  NEUROLOGIC: Cranial nerves II through XII are intact. Muscle strength 5/5 in all extremities. Sensation intact. Gait not checked.  PSYCHIATRIC: The patient is alert and oriented x 3.  SKIN:  Slight swelling right prepatellar area. Only slight erythema.   LABORATORY PANEL:   CBC  Recent Labs Lab 07/31/17 1652  WBC 4.0  HGB 13.4  HCT 37.9*  PLT 196   ------------------------------------------------------------------------------------------------------------------  Chemistries   Recent Labs Lab 07/31/17 1652  NA 136  K 4.3  CL 102  CO2 28  GLUCOSE 154*  BUN 17  CREATININE 1.00  CALCIUM 9.3   ------------------------------------------------------------------------------------------------------------------  Cardiac Enzymes  Recent Labs Lab 07/31/17 1652  TROPONINI <0.03   ------------------------------------------------------------------------------------------------------------------  RADIOLOGY:  Dg Chest 2 View  Result Date: 07/31/2017 CLINICAL DATA:  Mid chest pain. EXAM: CHEST  2 VIEW COMPARISON:  06/12/2017 FINDINGS: Postsurgical changes from CABG, stable. Cardiomediastinal silhouette is stably enlarged. Mediastinal contours appear intact. There is no evidence of focal airspace consolidation, pleural effusion or pneumothorax. Osseous structures are without acute abnormality. Soft tissues are grossly normal. IMPRESSION: Mildly stably enlarged cardiac silhouette. No evidence of acute findings in the chest. Electronically Signed   By: Ted Mcalpine M.D.   On: 07/31/2017 18:06   US Venous Img Lower Unilateral Right  Result Date: 07/31/2017 CLINICAL DATA:  Pain and swelling of the right lower extremity. EXAM: RIGHT LOWER EXTREMITY VENOUS DOPPLER ULTRASOUND TECHNIQUE: Gray-scale sonography with graded compression, as well as color Doppler and duplex ultrasound were performed to evaluate the lower extremity deep venous systems from the level of the common femoral vein and including the common femoral, femoral, profunda femoral, popliteal and calf veins including the posterior tibial, peroneal and gastrocnemius veins when visible. The superficial great  saphenous vein was also interrogated. Spectral Doppler was utilized to evaluate flow at rest and with distal augmentation maneuvers in the common femoral, femoral and popliteal veins. COMPARISON:  None. FINDINGS: Contralateral Common Femoral Vein: Respiratory phasicity is normal and symmetric with the symptomatic side. No evidence of thrombus. Normal compressibility. Common Femoral Vein: No evidence of thrombus. Normal compressibility, respiratory phasicity and response to augmentation. Saphenofemoral Junction: No evidence of thrombus. Normal compressibility and flow on color Doppler imaging. Profunda Femoral Vein: No evidence of thrombus. Normal compressibility and flow on color Doppler imaging. Femoral Vein: No evidence of thrombus. Normal compressibility, respiratory phasicity and response to augmentation. Popliteal Vein: No evidence of thrombus. Normal compressibility, respiratory phasicity and response to augmentation. Calf Veins: No evidence of thrombus. Normal compressibility and flow on color Doppler imaging. Superficial Great Saphenous Vein: No evidence of thrombus. Normal compressibility and flow on color  Doppler imaging. Venous Reflux:  None. Other Findings:  Soft tissue swelling of the calf. IMPRESSION: No evidence of DVT within the right lower extremity. Electronically Signed   By: Ted Mcalpine M.D.   On: 07/31/2017 18:49    EKG:   Normal sinus rhythm 60 bpm. No acute ST-T wave changes  IMPRESSION AND PLAN:   1. Atypical chest pain. Case discussed with his cardiologist Dr. Darrold Junker. If enzymes remain negative can get a stress test tomorrow. If enzymes turn positive then can be switched to a cardiac cath. He recommended holding off on heparin drip and less enzymes turn positive. Continue aspirin Brilinta and Coreg and Crestor at this time. 2. Dysphagia, GERD. Start IV Pepcid. Can consider GI workup if cardiology workup is negative. 3. Parkinson's disease continue Sinemet 4. Right knee  infection with staph aureus on clindamycin. ER physician gave a dose of vancomycin 5. 2 episodes of Diarrhea continue to monitor. If any further can consider sending stool for C. Difficile. 6. Impaired fasting glucose and prediabetes. Place on sliding scale for now. 7. Essential hypertension continue usual medications 8. Hyperlipidemia unspecified continue Crestor and check a lipid profile in the a.m.  All the records are reviewed and case discussed with ED provider. Management plans discussed with the patient, family and they are in agreement.  CODE STATUS: Full code  TOTAL TIME TAKING CARE OF THIS PATIENT: 50 minutes.    Alford Highland M.D on 07/31/2017 at 8:57 PM  Between 7am to 6pm - Pager - [phone]  After 6pm call admission pager [phone]  [facility]  [phone]  CC: Primary care physician; Duanne Limerick, MD

## 2017-07-31 NOTE — ED Notes (Signed)
Pt taken to xray via stretcher  

## 2017-08-01 ENCOUNTER — Other Ambulatory Visit: Payer: BLUE CROSS/BLUE SHIELD

## 2017-08-01 DIAGNOSIS — K222 Esophageal obstruction: Secondary | ICD-10-CM | POA: Diagnosis not present

## 2017-08-01 DIAGNOSIS — R079 Chest pain, unspecified: Secondary | ICD-10-CM

## 2017-08-01 DIAGNOSIS — R1310 Dysphagia, unspecified: Secondary | ICD-10-CM

## 2017-08-01 LAB — BASIC METABOLIC PANEL
Anion gap: 5 (ref 5–15)
BUN: 16 mg/dL (ref 6–20)
CALCIUM: 9 mg/dL (ref 8.9–10.3)
CO2: 25 mmol/L (ref 22–32)
Chloride: 106 mmol/L (ref 101–111)
Creatinine, Ser: 0.84 mg/dL (ref 0.61–1.24)
GFR calc Af Amer: >60 mL/min (ref >60)
GLUCOSE: 167 mg/dL — AB (ref 65–99)
Potassium: 3.9 mmol/L (ref 3.5–5.1)
SODIUM: 136 mmol/L (ref 135–145)

## 2017-08-01 LAB — GLUCOSE, CAPILLARY
GLUCOSE-CAPILLARY: 112 mg/dL — AB (ref 65–99)
Glucose-Capillary: 133 mg/dL — ABNORMAL HIGH (ref 65–99)

## 2017-08-01 LAB — CBC
HEMATOCRIT: 35.9 % — AB (ref 40.0–52.0)
HEMOGLOBIN: 12.6 g/dL — AB (ref 13.0–18.0)
MCH: 31.7 pg (ref 26.0–34.0)
MCHC: 35 g/dL (ref 32.0–36.0)
MCV: 90.4 fL (ref 80.0–100.0)
Platelets: 180 10*3/uL (ref 150–440)
RBC: 3.97 MIL/uL — ABNORMAL LOW (ref 4.40–5.90)
RDW: 14.4 % (ref 11.5–14.5)
WBC: 4.3 10*3/uL (ref 3.8–10.6)

## 2017-08-01 LAB — TROPONIN I

## 2017-08-01 LAB — LIPID PANEL
CHOL/HDL RATIO: 4.6 ratio
CHOLESTEROL: 120 mg/dL (ref 0–200)
HDL: 26 mg/dL — ABNORMAL LOW (ref >40)
LDL Cholesterol: 42 mg/dL (ref 0–99)
TRIGLYCERIDES: 260 mg/dL — AB (ref <150)
VLDL: 52 mg/dL — AB (ref 0–40)

## 2017-08-01 MED ORDER — CALCIUM CARBONATE ANTACID 500 MG PO CHEW
400.0000 mg | CHEWABLE_TABLET | Freq: Two times a day (BID) | ORAL | Status: DC
  Administered 2017-08-01: 400 mg via ORAL
  Filled 2017-08-01: qty 2

## 2017-08-01 NOTE — Consult Note (Signed)
[facility] Cardiology  CARDIOLOGY CONSULT NOTE  Patient ID: Sean Barajas MRN: [id number] DOB/AGE: [birth year] 62 y.o.  Admit date: 07/31/2017 Referring Physician Renae Gloss Primary Physician Elizabeth Sauer Primary Cardiologist Paraschos Reason for Consultation Chest pain  HPI: 61 year old male referred for evaluation of chest pain. The patient has a history of CAD status post CABG x 3 in 2008 and multiple stents, most recently DES to distal SVG to RCA, overlapping previous stent, patient stents proximal LCx and OM2, 06/13/2017, essential hypertension, hyperlipidemia, type 2 diabetes, and Parkinson's disease. The patient reports being in his usual state of health until yesterday morning after drinking 2 protein shakes. He developed centralized, focal chest discomfort that he states felt like "gas" in nature, unlike what he has experienced in the past. He denies associated shortness of breath, radiation, or nausea, but states he had diaphoresis. He then had bouts of diarrhea. Later in the afternoon, the patient ate a heavy lunch and took his medication and immediately felt severe centralized chest pain that continued to progressively get worse. He called EMS; after receiving 4 baby aspirins, he felt some relief. Of note, the patient has been suffering with dysphagia and odynophagia, followed by GI. The patient recently underwent a barium swallow, which revealed a small sliding hiatal hernia, and mild narrowing of the distal cervical esophagus. Esophageal dilatation has been deferred for the time as the patient is on Brilinta and is advised not to discontinue it due to recent stents. Admission labs notable for troponin x3 <0.03. Chest xray negative for acute findings. ECG revealed sinus rhythm at a rate of 66 bpm without evidence of STEMI. Currently, the patient states he still has centralized chest "achiness," but his chest pain has mostly resolved. He states any time he swallows, including saliva and water, he  has the centralized chest discomfort.   Review of systems complete and found to be negative unless listed above     Past Medical History:  Diagnosis Date  . Anxiety   . Coronary artery disease   . Hyperlipidemia   . Hypertension   . Parkinson's disease (HCC)   . Prediabetes     Past Surgical History:  Procedure Laterality Date  . APPENDECTOMY    . CARDIAC CATHETERIZATION    . CAROTID STENT  07/2015  . CHOLECYSTECTOMY    . CORONARY ANGIOPLASTY    . CORONARY ARTERY BYPASS GRAFT    . CORONARY STENT INTERVENTION N/A 01/30/2017   Procedure: Coronary Stent Intervention;  Surgeon: Marcina Millard, MD;  Location: [facility] INVASIVE CV LAB;  Service: Cardiovascular;  Laterality: N/A;  . CORONARY STENT INTERVENTION N/A 06/13/2017   Procedure: Coronary Stent Intervention;  Surgeon: Iran Ouch, MD;  Location: [facility] INVASIVE CV LAB;  Service: Cardiovascular;  Laterality: N/A;  . EYE SURGERY Bilateral   . LEFT HEART CATH AND CORS/GRAFTS ANGIOGRAPHY N/A 01/30/2017   Procedure: Left Heart Cath and Cors/Grafts Angiography and possible PCI;  Surgeon: Marcina Millard, MD;  Location: [facility] INVASIVE CV LAB;  Service: Cardiovascular;  Laterality: N/A;  . LEFT HEART CATH AND CORS/GRAFTS ANGIOGRAPHY N/A 06/13/2017   Procedure: Left Heart Cath and Cors/Grafts Angiography;  Surgeon: Lamar Blinks, MD;  Location: [facility] INVASIVE CV LAB;  Service: Cardiovascular;  Laterality: N/A;  . NASAL SEPTUM SURGERY    . TONSILLECTOMY      Prescriptions Prior to Admission  Medication Sig Dispense Refill Last Dose  . albuterol (PROVENTIL HFA;VENTOLIN HFA) 108 (90 BASE) MCG/ACT inhaler Inhale 2 puffs into the lungs every 6 (six) hours  as needed for wheezing or shortness of breath.   prn at prn  . ALPRAZolam (XANAX) 0.5 MG tablet Take 0.5 mg by mouth 3 (three) times daily. Dr Malvin JohnsPotter   07/31/2017 at 1500  . ARTIFICIAL TEAR OP Apply 1 drop to eye as needed (for dry eyes).   07/30/2017 at 2200  . aspirin EC 81 MG  tablet Take 81 mg by mouth daily.   07/31/2017 at 0900  . carbidopa-levodopa (SINEMET CR) 50-200 MG tablet Take 1 tablet by mouth at bedtime. 90 tablet 1 07/30/2017 at 2200  . carbidopa-levodopa (SINEMET IR) 25-100 MG tablet Take 2 tablets by mouth 6 (six) times daily. (Patient taking differently: 3/2/3/2/2) 1080 tablet 1 07/31/2017 at 1500  . carvedilol (COREG) 6.25 MG tablet Take 6.25 mg by mouth 2 (two) times daily.    07/31/2017 at 0900  . clindamycin (CLEOCIN) 300 MG capsule Take 1 capsule by mouth 4 (four) times daily. Take 1 capsule by mouth 4 (four) times daily for 7 days.  0 07/31/2017 at 0900  . empagliflozin (JARDIANCE) 10 MG TABS tablet Take 10 mg by mouth daily. 30 tablet 11 Past Week at Unknown time  . isosorbide mononitrate (IMDUR) 30 MG 24 hr tablet Take 1 tablet (30 mg total) by mouth daily. 30 tablet 0 07/31/2017 at 0900  . lisinopril (ZESTRIL) 2.5 MG tablet Take 1 tablet (2.5 mg total) by mouth daily. 30 tablet 0 07/31/2017 at 0900  . neomycin-polymyxin-hydrocortisone (CORTISPORIN) 3.5-10000-1 OTIC suspension Place 3 drops into both ears 4 (four) times daily. 10 mL 0 prn at prn  . nitroGLYCERIN (NITROSTAT) 0.4 MG SL tablet Place 0.4 mg under the tongue every 5 (five) minutes as needed for chest pain.   prn at prn  . phenol (CHLORASEPTIC) 1.4 % LIQD Use as directed 1 spray in the mouth or throat as needed for throat irritation / pain.   prn at prn  . promethazine (PHENERGAN) 25 MG tablet Take 25 mg by mouth every 8 (eight) hours as needed for nausea or vomiting.   prn at prn  . rosuvastatin (CRESTOR) 20 MG tablet Take 20 mg by mouth at bedtime.   07/30/2017 at 2200  . ticagrelor (BRILINTA) 90 MG TABS tablet Take 90 mg by mouth every 12 (twelve) hours.   07/31/2017 at 0900  . valACYclovir (VALTREX) 500 MG tablet Take 500 mg by mouth 2 (two) times daily.   prn at prn  . acetaminophen (TYLENOL) 500 MG tablet Take 500 mg by mouth every 6 (six) hours as needed for mild pain.    prn at prn  .  glucose blood test strip 1 each by Other route daily. And lancets 1/day 100 each 12 Taking   Social History   Social History  . Marital status: Single    Spouse name: N/A  . Number of children: N/A  . Years of education: N/A   Occupational History  . self employed     toe truck driver   Social History Main Topics  . Smoking status: Never Smoker  . Smokeless tobacco: Never Used  . Alcohol use No     Comment: 1 glass wine/month  . Drug use: No     Comment: hemp oil  . Sexual activity: Not on file   Other Topics Concern  . Not on file   Social History Narrative  . No narrative on file    Family History  Problem Relation Age of Onset  . Breast cancer Mother   . Diabetes  Father   . CAD Father   . CVA Father   . Diabetes Brother       Review of systems complete and found to be negative unless listed above      PHYSICAL EXAM  General: Well developed, well nourished, in no acute distress HEENT:  Normocephalic and atramatic Neck:  No JVD.  Lungs: Clear bilaterally to auscultation and percussion. Heart: Bradycardic. Normal S1 and S2 without gallops or murmurs.  Abdomen: Bowel sounds are positive, abdomen soft and non-tender  Msk:  Back normal. Gait not assessed. Normal strength and tone for age. Extremities: No clubbing, cyanosis or edema.   Neuro: Alert and oriented X 3. Psych:  Good affect, responds appropriately. Sl;urred speech  Labs:   Lab Results  Component Value Date   WBC 4.3 08/01/2017   HGB 12.6 (L) 08/01/2017   HCT 35.9 (L) 08/01/2017   MCV 90.4 08/01/2017   PLT 180 08/01/2017    Recent Labs Lab 08/01/17 0038  NA 136  K 3.9  CL 106  CO2 25  BUN 16  CREATININE 0.84  CALCIUM 9.0  GLUCOSE 167*   Lab Results  Component Value Date   TROPONINI <0.03 08/01/2017    Lab Results  Component Value Date   CHOL 120 08/01/2017   CHOL 138 06/12/2017   CHOL 145 01/30/2017   Lab Results  Component Value Date   HDL 26 (L) 08/01/2017   HDL 35  (L) 06/12/2017   HDL 36 (L) 01/30/2017   Lab Results  Component Value Date   LDLCALC 42 08/01/2017   LDLCALC 67 06/12/2017   LDLCALC 83 01/30/2017   Lab Results  Component Value Date   TRIG 260 (H) 08/01/2017   TRIG 178 (H) 06/12/2017   TRIG 129 01/30/2017   Lab Results  Component Value Date   CHOLHDL 4.6 08/01/2017   CHOLHDL 3.9 06/12/2017   CHOLHDL 4.0 01/30/2017   No results found for: LDLDIRECT    Radiology: Dg Chest 2 View  Result Date: 07/31/2017 CLINICAL DATA:  Mid chest pain. EXAM: CHEST  2 VIEW COMPARISON:  06/12/2017 FINDINGS: Postsurgical changes from CABG, stable. Cardiomediastinal silhouette is stably enlarged. Mediastinal contours appear intact. There is no evidence of focal airspace consolidation, pleural effusion or pneumothorax. Osseous structures are without acute abnormality. Soft tissues are grossly normal. IMPRESSION: Mildly stably enlarged cardiac silhouette. No evidence of acute findings in the chest. Electronically Signed   By: Ted Mcalpine M.D.   On: 07/31/2017 18:06   US Venous Img Lower Unilateral Right  Result Date: 07/31/2017 CLINICAL DATA:  Pain and swelling of the right lower extremity. EXAM: RIGHT LOWER EXTREMITY VENOUS DOPPLER ULTRASOUND TECHNIQUE: Gray-scale sonography with graded compression, as well as color Doppler and duplex ultrasound were performed to evaluate the lower extremity deep venous systems from the level of the common femoral vein and including the common femoral, femoral, profunda femoral, popliteal and calf veins including the posterior tibial, peroneal and gastrocnemius veins when visible. The superficial great saphenous vein was also interrogated. Spectral Doppler was utilized to evaluate flow at rest and with distal augmentation maneuvers in the common femoral, femoral and popliteal veins. COMPARISON:  None. FINDINGS: Contralateral Common Femoral Vein: Respiratory phasicity is normal and symmetric with the symptomatic side. No  evidence of thrombus. Normal compressibility. Common Femoral Vein: No evidence of thrombus. Normal compressibility, respiratory phasicity and response to augmentation. Saphenofemoral Junction: No evidence of thrombus. Normal compressibility and flow on color Doppler imaging. Profunda Femoral Vein: No evidence  of thrombus. Normal compressibility and flow on color Doppler imaging. Femoral Vein: No evidence of thrombus. Normal compressibility, respiratory phasicity and response to augmentation. Popliteal Vein: No evidence of thrombus. Normal compressibility, respiratory phasicity and response to augmentation. Calf Veins: No evidence of thrombus. Normal compressibility and flow on color Doppler imaging. Superficial Great Saphenous Vein: No evidence of thrombus. Normal compressibility and flow on color Doppler imaging. Venous Reflux:  None. Other Findings:  Soft tissue swelling of the calf. IMPRESSION: No evidence of DVT within the right lower extremity. Electronically Signed   By: Ted Mcalpine M.D.   On: 07/31/2017 18:49   US Venous Img Lower Unilateral Right  Result Date: 07/22/2017 CLINICAL DATA:  Knee pain. EXAM: Right LOWER EXTREMITY VENOUS DOPPLER ULTRASOUND TECHNIQUE: Gray-scale sonography with graded compression, as well as color Doppler and duplex ultrasound were performed to evaluate the lower extremity deep venous systems from the level of the common femoral vein and including the common femoral, femoral, profunda femoral, popliteal and calf veins including the posterior tibial, peroneal and gastrocnemius veins when visible. The superficial great saphenous vein was also interrogated. Spectral Doppler was utilized to evaluate flow at rest and with distal augmentation maneuvers in the common femoral, femoral and popliteal veins. COMPARISON:  None. FINDINGS: Contralateral Common Femoral Vein: Respiratory phasicity is normal and symmetric with the symptomatic side. No evidence of thrombus. Normal  compressibility. Common Femoral Vein: No evidence of thrombus. Normal compressibility, respiratory phasicity and response to augmentation. Saphenofemoral Junction: No evidence of thrombus. Normal compressibility and flow on color Doppler imaging. Profunda Femoral Vein: No evidence of thrombus. Normal compressibility and flow on color Doppler imaging. Femoral Vein: No evidence of thrombus. Normal compressibility, respiratory phasicity and response to augmentation. Popliteal Vein: No evidence of thrombus. Normal compressibility, respiratory phasicity and response to augmentation. Calf Veins: No evidence of thrombus. Normal compressibility and flow on color Doppler imaging. Superficial Great Saphenous Vein: No evidence of thrombus. Normal compressibility and flow on color Doppler imaging. Venous Reflux:  None. Other Findings:  None. IMPRESSION: No evidence of DVT within the right lower extremity. Electronically Signed   By: Signa Kell M.D.   On: 07/22/2017 09:05   Dg Knee Complete 4 Views Right  Result Date: 07/22/2017 CLINICAL DATA:  Right knee pain since last night. Swelling. No known injury. EXAM: RIGHT KNEE - COMPLETE 4+ VIEW COMPARISON:  None. FINDINGS: No evidence of fracture or dislocation. The alignment and joint spaces are maintained. There is a quadriceps tendon enthesophyte. No evidence of arthropathy or other focal bone abnormality. Trace joint effusion. Diffuse soft tissue edema. More focal soft tissue prominence in the prepatellar region. IMPRESSION: Soft tissue edema with more focal soft tissue prominence in the prepatellar region, suggesting prepatellar bursitis. No acute osseous abnormality. Electronically Signed   By: Rubye Oaks M.D.   On: 07/22/2017 06:18    EKG: Sinus bradycardia, 53 bpm  ASSESSMENT AND PLAN:  1. Chest pain, with negative ECG and negative serial troponin with a history of CAD, status post CABG and multiple stents, most recently DES to distal SVG to RCA,  overlapping previous stent, patient stents proximal LCx and OM2, 06/13/17. Chest pain started yesterday after eating and tends to be exacerbated by any swallowing. 2. Hypertension 3. Hyperlipidemia 4. Sinus bradycardia, asymptomatic, on carvedilol 6.25 daily 5. Parkinson's disease 6. Type 2 diabetes 7. Recent history of odynophagia and dysphagia, followed by GI  Recommendations: 1. Continue current therapy. 2. Defer stress test this morning as chest pain sounds GI/esophageal  related. 3. Recommend decreasing carvedilol to 3.125 mg BID for now as patient is bradycardic 4. Ambulate today. 5. Consider GI consult. 6. Further recommendation's pending patient's initial course.  Signed: Leanora Ivanoff PA-C 08/01/2017, 7:56 AM

## 2017-08-01 NOTE — Consult Note (Signed)
Sean Minium, MD [facility]  [address] [place], [place] [address] Phone: [phone] Fax : [phone]  Consultation  Referring Provider:     Dr. Imogene Burn Primary Care Physician:  Duanne Limerick, MD Primary Gastroenterologist:  Dr. Mechele Collin         Reason for Consultation:     Dysphagia and chest pain  Date of Admission:  07/31/2017 Date of Consultation:  08/01/2017         HPI:   Sean Barajas is a 61 y.o. male who was admitted with chest pain and a history of esophageal stricture. The patient had an upper GI barium swallow back in July which showed a mild narrowing with aspiration of barium. The prior studies done for swallowing evaluation on June 21 also showed aspiration. The patient was seen by cardiology who stated that the patient did not appear to have cardiac issues and should be seen by GI. The patient reports that he had a heart attack back in July of this year. He also reports that his cardiologist has told him that he cannot stop anticoagulation medication for at least the next 4 months. He reports that he has had increased snoring recently also reports that he has had no weight loss and states he has even gained weight. He reports that his chest discomfort is made worse with eating. He states he has EN a few large meals recently that have exacerbated the pain.  Past Medical History:  Diagnosis Date  . Anxiety   . Coronary artery disease   . Hyperlipidemia   . Hypertension   . Parkinson's disease (HCC)   . Prediabetes     Past Surgical History:  Procedure Laterality Date  . APPENDECTOMY    . CARDIAC CATHETERIZATION    . CAROTID STENT  07/2015  . CHOLECYSTECTOMY    . CORONARY ANGIOPLASTY    . CORONARY ARTERY BYPASS GRAFT    . CORONARY STENT INTERVENTION N/A 01/30/2017   Procedure: Coronary Stent Intervention;  Surgeon: Marcina Millard, MD;  Location: [facility] INVASIVE CV LAB;  Service: Cardiovascular;  Laterality: N/A;  . CORONARY STENT INTERVENTION N/A  06/13/2017   Procedure: Coronary Stent Intervention;  Surgeon: Iran Ouch, MD;  Location: [facility] INVASIVE CV LAB;  Service: Cardiovascular;  Laterality: N/A;  . EYE SURGERY Bilateral   . LEFT HEART CATH AND CORS/GRAFTS ANGIOGRAPHY N/A 01/30/2017   Procedure: Left Heart Cath and Cors/Grafts Angiography and possible PCI;  Surgeon: Marcina Millard, MD;  Location: [facility] INVASIVE CV LAB;  Service: Cardiovascular;  Laterality: N/A;  . LEFT HEART CATH AND CORS/GRAFTS ANGIOGRAPHY N/A 06/13/2017   Procedure: Left Heart Cath and Cors/Grafts Angiography;  Surgeon: Lamar Blinks, MD;  Location: [facility] INVASIVE CV LAB;  Service: Cardiovascular;  Laterality: N/A;  . NASAL SEPTUM SURGERY    . TONSILLECTOMY      Prior to Admission medications   Medication Sig Start Date End Date Taking? Authorizing Provider  albuterol (PROVENTIL HFA;VENTOLIN HFA) 108 (90 BASE) MCG/ACT inhaler Inhale 2 puffs into the lungs every 6 (six) hours as needed for wheezing or shortness of breath.   Yes [provider]  ALPRAZolam Prudy Feeler) 0.5 MG tablet Take 0.5 mg by mouth 3 (three) times daily. Dr Malvin Johns   Yes [provider]  ARTIFICIAL TEAR OP Apply 1 drop to eye as needed (for dry eyes).   Yes [provider]  aspirin EC 81 MG tablet Take 81 mg by mouth daily.   Yes [provider]  carbidopa-levodopa (SINEMET CR) 50-200 MG tablet Take 1 tablet by mouth at bedtime. 12/28/16  Yes Tat, Octaviano Batty, DO  carbidopa-levodopa (SINEMET IR) 25-100 MG tablet Take 2 tablets by mouth 6 (six) times daily. Patient taking differently: 3/2/3/2/2 12/28/16  Yes Tat, Octaviano Batty, DO  carvedilol (COREG) 6.25 MG tablet Take 6.25 mg by mouth 2 (two) times daily.    Yes [provider]  clindamycin (CLEOCIN) 300 MG capsule Take 1 capsule by mouth 4 (four) times daily. Take 1 capsule by mouth 4 (four) times daily for 7 days. 07/25/17  Yes [provider]  empagliflozin (JARDIANCE) 10 MG TABS tablet Take  10 mg by mouth daily. 06/27/17  Yes Romero Belling, MD  isosorbide mononitrate (IMDUR) 30 MG 24 hr tablet Take 1 tablet (30 mg total) by mouth daily. 01/31/17  Yes Delfino Lovett, MD  lisinopril (ZESTRIL) 2.5 MG tablet Take 1 tablet (2.5 mg total) by mouth daily. 01/31/17  Yes Delfino Lovett, MD  neomycin-polymyxin-hydrocortisone (CORTISPORIN) 3.5-10000-1 OTIC suspension Place 3 drops into both ears 4 (four) times daily. 06/17/17  Yes Duanne Limerick, MD  nitroGLYCERIN (NITROSTAT) 0.4 MG SL tablet Place 0.4 mg under the tongue every 5 (five) minutes as needed for chest pain.   Yes [provider]  phenol (CHLORASEPTIC) 1.4 % LIQD Use as directed 1 spray in the mouth or throat as needed for throat irritation / pain.   Yes [provider]  promethazine (PHENERGAN) 25 MG tablet Take 25 mg by mouth every 8 (eight) hours as needed for nausea or vomiting.   Yes [provider]  rosuvastatin (CRESTOR) 20 MG tablet Take 20 mg by mouth at bedtime.   Yes [provider]  ticagrelor (BRILINTA) 90 MG TABS tablet Take 90 mg by mouth every 12 (twelve) hours.   Yes [provider]  valACYclovir (VALTREX) 500 MG tablet Take 500 mg by mouth 2 (two) times daily.   Yes [provider]  acetaminophen (TYLENOL) 500 MG tablet Take 500 mg by mouth every 6 (six) hours as needed for mild pain.     [provider]  glucose blood test strip 1 each by Other route daily. And lancets 1/day 06/25/17   Romero Belling, MD    Family History  Problem Relation Age of Onset  . Breast cancer Mother   . Diabetes Father   . CAD Father   . CVA Father   . Diabetes Brother      Social History  Substance Use Topics  . Smoking status: Never Smoker  . Smokeless tobacco: Never Used  . Alcohol use No     Comment: 1 glass wine/month    Allergies as of 07/31/2017 - Review Complete 07/31/2017  Allergen Reaction Noted  . Iodinated diagnostic agents Nausea Only and Other (See Comments)  07/25/2015  . Sulfa antibiotics Rash 07/25/2015    Review of Systems:    All systems reviewed and negative except where noted in HPI.   Physical Exam:  Vital signs in last 24 hours: Temp:  [97.6 F (36.4 C)-98 F (36.7 C)] 98 F (36.7 C) (08/30 0749) Pulse Rate:  [51-67] 53 (08/30 0749) Resp:  [11-28] 16 (08/30 0749) BP: (125-171)/(67-86) 146/83 (08/30 0749) SpO2:  [93 %-99 %] 96 % (08/30 0749) Weight:  [217 lb 1.6 oz (98.5 kg)-222 lb 10.6 oz (101 kg)] 217 lb 1.6 oz (98.5 kg) (08/29 2138) Last BM Date: 07/31/17 General:   Pleasant, cooperative in NAD Head:  Normocephalic and atraumatic. Eyes:  No icterus.   Conjunctiva pink. PERRLA. Ears:  Normal auditory acuity. Neck:  Supple; no masses or thyroidomegaly Lungs: Respirations even and unlabored. Lungs clear to auscultation bilaterally.   No wheezes, crackles, or rhonchi.  Heart:  Regular rate and rhythm;  Without murmur, clicks, rubs or gallops Abdomen:  Soft, nondistended, nontender. Normal bowel sounds. No appreciable masses or hepatomegaly.  No rebound or guarding.  Rectal:  Not performed. Msk:  Symmetrical without gross deformities.    Extremities:  Without edema, cyanosis or clubbing. Neurologic:  Alert and oriented x3;  grossly normal neurologically. Skin:  Intact without significant lesions or rashes. Cervical Nodes:  No significant cervical adenopathy. Psych:  Alert and cooperative. Normal affect.  LAB RESULTS:  Recent Labs  07/31/17 1652 08/01/17 0038  WBC 4.0 4.3  HGB 13.4 12.6*  HCT 37.9* 35.9*  PLT 196 180   BMET  Recent Labs  07/31/17 1652 08/01/17 0038  NA 136 136  K 4.3 3.9  CL 102 106  CO2 28 25  GLUCOSE 154* 167*  BUN 17 16  CREATININE 1.00 0.84  CALCIUM 9.3 9.0   LFT No results for input(s): PROT, ALBUMIN, AST, ALT, ALKPHOS, BILITOT, BILIDIR, IBILI in the last 72 hours. PT/INR No results for input(s): LABPROT, INR in the last 72 hours.  STUDIES: Dg Chest 2 View  Result Date:  07/31/2017 CLINICAL DATA:  Mid chest pain. EXAM: CHEST  2 VIEW COMPARISON:  06/12/2017 FINDINGS: Postsurgical changes from CABG, stable. Cardiomediastinal silhouette is stably enlarged. Mediastinal contours appear intact. There is no evidence of focal airspace consolidation, pleural effusion or pneumothorax. Osseous structures are without acute abnormality. Soft tissues are grossly normal. IMPRESSION: Mildly stably enlarged cardiac silhouette. No evidence of acute findings in the chest. Electronically Signed   By: Ted Mcalpine M.D.   On: 07/31/2017 18:06   US Venous Img Lower Unilateral Right  Result Date: 07/31/2017 CLINICAL DATA:  Pain and swelling of the right lower extremity. EXAM: RIGHT LOWER EXTREMITY VENOUS DOPPLER ULTRASOUND TECHNIQUE: Gray-scale sonography with graded compression, as well as color Doppler and duplex ultrasound were performed to evaluate the lower extremity deep venous systems from the level of the common femoral vein and including the common femoral, femoral, profunda femoral, popliteal and calf veins including the posterior tibial, peroneal and gastrocnemius veins when visible. The superficial great saphenous vein was also interrogated. Spectral Doppler was utilized to evaluate flow at rest and with distal augmentation maneuvers in the common femoral, femoral and popliteal veins. COMPARISON:  None. FINDINGS: Contralateral Common Femoral Vein: Respiratory phasicity is normal and symmetric with the symptomatic side. No evidence of thrombus. Normal compressibility. Common Femoral Vein: No evidence of thrombus. Normal compressibility, respiratory phasicity and response to augmentation. Saphenofemoral Junction: No evidence of thrombus. Normal compressibility and flow on color Doppler imaging. Profunda Femoral Vein: No evidence of thrombus. Normal compressibility and flow on color Doppler imaging. Femoral Vein: No evidence of thrombus. Normal compressibility, respiratory phasicity and  response to augmentation. Popliteal Vein: No evidence of thrombus. Normal compressibility, respiratory phasicity and response to augmentation. Calf Veins: No evidence of thrombus. Normal compressibility and flow on color Doppler imaging. Superficial Great Saphenous Vein: No evidence of thrombus. Normal compressibility and flow on color Doppler imaging. Venous Reflux:  None. Other Findings:  Soft tissue swelling of the calf. IMPRESSION: No evidence of DVT within the right lower extremity. Electronically Signed   By: Ted Mcalpine M.D.   On: 07/31/2017 18:49      Impression / Plan:  Ulanda EdisonRobert M Haring is a 61 y.o. y/o male with Dysphagia and a mild narrowing seen on his barium swallow. The patient has also had some aspiration seen on his last 2 swallowing evaluations. The patient is being seen for the dysphagia and the stricture. He is presently on a blood thinner for his cardiac issues and reports that his had a recent heart attack. The patient is not a candidate for any endoscopic procedure with intervention at this time due to his blood thinner. As been seen in the past multiple times by Dr. Mechele CollinElliott and his nurse practitioner. The patient should follow-up as an outpatient when he can have an EGD with dilation with Dr. Mechele CollinElliott. He has been encouraged to eat soft foods and stay away from steak chicken pulled pork and breads which are hard to get down the esophagus. I will sign off.  Please call if any further GI concerns or questions.  We would like to thank you for the opportunity to participate in the care of Ulanda EdisonRobert M Ashraf.    Thank you for involving me in the care of this patient.      LOS: 0 days   Sean Miniumarren Boston Catarino, MD  08/01/2017, 10:48 AM   Note: This dictation was prepared with Dragon dictation along with smaller phrase technology. Any transcriptional errors that result from this process are unintentional.

## 2017-08-01 NOTE — Progress Notes (Signed)
Ulanda EdisonWhitfield, Clint M was transferred from the Ed following c/o CP. Troponin <0.03 X2. On admission  Patient A&O X4, rated  His pain at 3/10 and stated that he does not need anything for pain. Patient was oriented to his room, admission documentation completed. Patient has no acute event overnight,NSR with stable VS. NPO, consent form signed for possible lexiscan scheduled for AM.

## 2017-08-01 NOTE — Progress Notes (Signed)
Patient discharged home as ordered,instructions explained and well understood,follow up appointments made,vital signd within normal limits upon discharge,personal medications collected from pharmacy and returned to patient,escorted by son and staff member via wheelchair

## 2017-08-01 NOTE — Discharge Instructions (Signed)
Heart healthy soft diet. Aspiration precaution.

## 2017-08-01 NOTE — Discharge Summary (Signed)
[facility] [place] [facility]   PATIENT NAME: Sean Barajas    MR#:  [id number]  DATE OF BIRTH:  [birth year]  DATE OF ADMISSION:  07/31/2017   ADMITTING PHYSICIAN: Alford Highland, MD  DATE OF DISCHARGE: 08/01/2017 PRIMARY CARE PHYSICIAN: Duanne Limerick, MD   ADMISSION DIAGNOSIS:  Diaphoresis [R61] Cellulitis of right lower extremity [L03.115] Chest pain, unspecified type [R07.9] DISCHARGE DIAGNOSIS:  Active Problems:   Chest pain   Dysphagia   Esophageal stricture  SECONDARY DIAGNOSIS:   Past Medical History:  Diagnosis Date  . Anxiety   . Coronary artery disease   . Hyperlipidemia   . Hypertension   . Parkinson's disease (HCC)   . Prediabetes    HOSPITAL COURSE:   1. Atypical chest pain. With recent MI  Negative enzymes, it is GI etiology and no need for stress test per Dr. Darrold Junker. Continue aspirin Brilinta and Coreg and Crestor.  2. Dysphagia with mild narrowing seen on his barium swallow , GERD.  The patient has also had some aspiration seen on his last 2 swallowing evaluations. The patient is being seen for the dysphagia and the stricture. The patient should follow-up as an outpatient when he can have an EGD with dilation with Dr. Mechele Collin per Dr. Servando Snare.  3. Parkinson's disease continue Sinemet 4. Right knee infection with staph aureus on clindamycin. ER physician gave a dose of vancomycin 5. 2 episodes of Diarrhea. Resolved. 6. Impaired fasting glucose and prediabetes. Place on sliding scale for now. 7. Essential hypertension continue usual medications 8. Hyperlipidemia unspecified continue Crestor. I discussed with Dr. Darrold Junker and Dr. Servando Snare. DISCHARGE CONDITIONS:  Stable, discharge to home today. CONSULTS OBTAINED:  Treatment Team:  Marcina Millard, MD DRUG ALLERGIES:   Allergies  Allergen Reactions  . Iodinated Diagnostic Agents Nausea Only and Other (See Comments)    Reaction:  Fever   . Sulfa Antibiotics Rash    DISCHARGE MEDICATIONS:   Allergies as of 08/01/2017      Reactions   Iodinated Diagnostic Agents Nausea Only, Other (See Comments)   Reaction:  Fever    Sulfa Antibiotics Rash      Medication List    TAKE these medications   acetaminophen 500 MG tablet Commonly known as:  TYLENOL Take 500 mg by mouth every 6 (six) hours as needed for mild pain.   albuterol 108 (90 Base) MCG/ACT inhaler Commonly known as:  PROVENTIL HFA;VENTOLIN HFA Inhale 2 puffs into the lungs every 6 (six) hours as needed for wheezing or shortness of breath.   ALPRAZolam 0.5 MG tablet Commonly known as:  XANAX Take 0.5 mg by mouth 3 (three) times daily. Dr Malvin Johns   ARTIFICIAL TEAR OP Apply 1 drop to eye as needed (for dry eyes).   aspirin EC 81 MG tablet Take 81 mg by mouth daily.   carbidopa-levodopa 50-200 MG tablet Commonly known as:  SINEMET CR Take 1 tablet by mouth at bedtime. What changed:  Another medication with the same name was changed. Make sure you understand how and when to take each.   carbidopa-levodopa 25-100 MG tablet Commonly known as:  SINEMET IR Take 2 tablets by mouth 6 (six) times daily. What changed:  how much to take  how to take this  when to take this  additional instructions   carvedilol 6.25 MG tablet Commonly known as:  COREG Take 6.25 mg by mouth 2 (two) times daily.   clindamycin 300 MG capsule Commonly known as:  CLEOCIN Take  1 capsule by mouth 4 (four) times daily. Take 1 capsule by mouth 4 (four) times daily for 7 days.   empagliflozin 10 MG Tabs tablet Commonly known as:  JARDIANCE Take 10 mg by mouth daily.   glucose blood test strip 1 each by Other route daily. And lancets 1/day   isosorbide mononitrate 30 MG 24 hr tablet Commonly known as:  IMDUR Take 1 tablet (30 mg total) by mouth daily.   lisinopril 2.5 MG tablet Commonly known as:  ZESTRIL Take 1 tablet (2.5 mg total) by mouth daily.   neomycin-polymyxin-hydrocortisone 3.5-10000-1  OTIC suspension Commonly known as:  CORTISPORIN Place 3 drops into both ears 4 (four) times daily.   nitroGLYCERIN 0.4 MG SL tablet Commonly known as:  NITROSTAT Place 0.4 mg under the tongue every 5 (five) minutes as needed for chest pain.   phenol 1.4 % Liqd Commonly known as:  CHLORASEPTIC Use as directed 1 spray in the mouth or throat as needed for throat irritation / pain.   promethazine 25 MG tablet Commonly known as:  PHENERGAN Take 25 mg by mouth every 8 (eight) hours as needed for nausea or vomiting.   rosuvastatin 20 MG tablet Commonly known as:  CRESTOR Take 20 mg by mouth at bedtime.   ticagrelor 90 MG Tabs tablet Commonly known as:  BRILINTA Take 90 mg by mouth every 12 (twelve) hours.   VALTREX 500 MG tablet Generic drug:  valACYclovir Take 500 mg by mouth 2 (two) times daily.            Discharge Care Instructions        Start     Ordered   08/01/17 0000  Increase activity slowly     08/01/17 1051   08/01/17 0000  Diet - low sodium heart healthy     08/01/17 1051       DISCHARGE INSTRUCTIONS:  See AVS.  If you experience worsening of your admission symptoms, develop shortness of breath, life threatening emergency, suicidal or homicidal thoughts you must seek medical attention immediately by calling 911 or calling your MD immediately  if symptoms less severe.  You Must read complete instructions/literature along with all the possible adverse reactions/side effects for all the Medicines you take and that have been prescribed to you. Take any new Medicines after you have completely understood and accpet all the possible adverse reactions/side effects.   Please note  You were cared for by a hospitalist during your hospital stay. If you have any questions about your discharge medications or the care you received while you were in the hospital after you are discharged, you can call the unit and asked to speak with the hospitalist on call if the  hospitalist that took care of you is not available. Once you are discharged, your primary care physician will handle any further medical issues. Please note that NO REFILLS for any discharge medications will be authorized once you are discharged, as it is imperative that you return to your primary care physician (or establish a relationship with a primary care physician if you do not have one) for your aftercare needs so that they can reassess your need for medications and monitor your lab values.    On the day of Discharge:  VITAL SIGNS:  Blood pressure (!) 146/83, pulse (!) 53, temperature 98 F (36.7 C), temperature source Oral, resp. rate 16, height 5\' 11"  (1.803 m), weight 217 lb 1.6 oz (98.5 kg), SpO2 96 %. PHYSICAL EXAMINATION:  GENERAL:  61 y.o.-year-old patient lying in the bed with no acute distress.  EYES: Pupils equal, round, reactive to light and accommodation. No scleral icterus. Extraocular muscles intact.  HEENT: Head atraumatic, normocephalic. Oropharynx and nasopharynx clear.  NECK:  Supple, no jugular venous distention. No thyroid enlargement, no tenderness.  LUNGS: Normal breath sounds bilaterally, no wheezing, rales,rhonchi or crepitation. No use of accessory muscles of respiration.  CARDIOVASCULAR: S1, S2 normal. No murmurs, rubs, or gallops.  ABDOMEN: Soft, non-tender, non-distended. Bowel sounds present. No organomegaly or mass.  EXTREMITIES: No pedal edema, cyanosis, or clubbing.  NEUROLOGIC: Cranial nerves II through XII are intact. Muscle strength 5/5 in all extremities. Sensation intact. Gait not checked.  PSYCHIATRIC: The patient is alert and oriented x 3.  SKIN: No obvious rash, lesion, or ulcer.  DATA REVIEW:   CBC  Recent Labs Lab 08/01/17 0038  WBC 4.3  HGB 12.6*  HCT 35.9*  PLT 180    Chemistries   Recent Labs Lab 08/01/17 0038  NA 136  K 3.9  CL 106  CO2 25  GLUCOSE 167*  BUN 16  CREATININE 0.84  CALCIUM 9.0     Microbiology Results   Results for orders placed or performed during the hospital encounter of 07/24/17  Body fluid culture     Status: None   Collection Time: 07/24/17  3:01 PM  Result Value Ref Range Status   Specimen Description KNEE  Final   Special Requests NONE  Final   Gram Stain   Final    FEW WBC PRESENT, PREDOMINANTLY PMN NO ORGANISMS SEEN    Culture   Final    No growth aerobically or anaerobically. Performed at [facility], [address] [place], [place] [address]    Report Status 07/28/2017 FINAL  Final    RADIOLOGY:  Dg Chest 2 View  Result Date: 07/31/2017 CLINICAL DATA:  Mid chest pain. EXAM: CHEST  2 VIEW COMPARISON:  06/12/2017 FINDINGS: Postsurgical changes from CABG, stable. Cardiomediastinal silhouette is stably enlarged. Mediastinal contours appear intact. There is no evidence of focal airspace consolidation, pleural effusion or pneumothorax. Osseous structures are without acute abnormality. Soft tissues are grossly normal. IMPRESSION: Mildly stably enlarged cardiac silhouette. No evidence of acute findings in the chest. Electronically Signed   By: Ted Mcalpine M.D.   On: 07/31/2017 18:06   US Venous Img Lower Unilateral Right  Result Date: 07/31/2017 CLINICAL DATA:  Pain and swelling of the right lower extremity. EXAM: RIGHT LOWER EXTREMITY VENOUS DOPPLER ULTRASOUND TECHNIQUE: Gray-scale sonography with graded compression, as well as color Doppler and duplex ultrasound were performed to evaluate the lower extremity deep venous systems from the level of the common femoral vein and including the common femoral, femoral, profunda femoral, popliteal and calf veins including the posterior tibial, peroneal and gastrocnemius veins when visible. The superficial great saphenous vein was also interrogated. Spectral Doppler was utilized to evaluate flow at rest and with distal augmentation maneuvers in the common femoral, femoral and popliteal veins. COMPARISON:  None. FINDINGS:  Contralateral Common Femoral Vein: Respiratory phasicity is normal and symmetric with the symptomatic side. No evidence of thrombus. Normal compressibility. Common Femoral Vein: No evidence of thrombus. Normal compressibility, respiratory phasicity and response to augmentation. Saphenofemoral Junction: No evidence of thrombus. Normal compressibility and flow on color Doppler imaging. Profunda Femoral Vein: No evidence of thrombus. Normal compressibility and flow on color Doppler imaging. Femoral Vein: No evidence of thrombus. Normal compressibility, respiratory phasicity and response to augmentation. Popliteal Vein: No  evidence of thrombus. Normal compressibility, respiratory phasicity and response to augmentation. Calf Veins: No evidence of thrombus. Normal compressibility and flow on color Doppler imaging. Superficial Great Saphenous Vein: No evidence of thrombus. Normal compressibility and flow on color Doppler imaging. Venous Reflux:  None. Other Findings:  Soft tissue swelling of the calf. IMPRESSION: No evidence of DVT within the right lower extremity. Electronically Signed   By: Ted Mcalpineobrinka  Dimitrova M.D.   On: 07/31/2017 18:49     Management plans discussed with the patient, family and they are in agreement.  CODE STATUS: Full Code   TOTAL TIME TAKING CARE OF THIS PATIENT: 37 minutes.    Shaune Pollackhen, Cambry Spampinato M.D on 08/01/2017 at 1:11 PM  Between 7am to 6pm - Pager - [phone]  After 6pm go to [email or web address] - Social research [email or web address], [email or web address] [email or web address]  [facility]  Office  [phone]  CC: Primary care physician; Duanne LimerickJones, Deanna C, MD   Note: This dictation was prepared with Dragon dictation along with smaller phrase technology. Any transcriptional errors that result from this process are unintentional.

## 2017-08-09 ENCOUNTER — Other Ambulatory Visit: Payer: Self-pay

## 2017-08-14 ENCOUNTER — Inpatient Hospital Stay: Payer: BLUE CROSS/BLUE SHIELD | Admitting: Family Medicine

## 2017-08-19 ENCOUNTER — Ambulatory Visit (INDEPENDENT_AMBULATORY_CARE_PROVIDER_SITE_OTHER): Payer: BLUE CROSS/BLUE SHIELD | Admitting: Family Medicine

## 2017-08-19 ENCOUNTER — Encounter: Payer: Self-pay | Admitting: Family Medicine

## 2017-08-19 VITALS — BP 120/80 | HR 78 | Ht 72.0 in | Wt 215.0 lb

## 2017-08-19 DIAGNOSIS — M7542 Impingement syndrome of left shoulder: Secondary | ICD-10-CM | POA: Diagnosis not present

## 2017-08-19 DIAGNOSIS — Z09 Encounter for follow-up examination after completed treatment for conditions other than malignant neoplasm: Secondary | ICD-10-CM

## 2017-08-19 DIAGNOSIS — R1310 Dysphagia, unspecified: Secondary | ICD-10-CM | POA: Diagnosis not present

## 2017-08-19 DIAGNOSIS — R1319 Other dysphagia: Secondary | ICD-10-CM

## 2017-08-19 NOTE — Progress Notes (Signed)
Name: Sean Barajas   MRN: [id number]    DOB: 07/18/1956   Date:08/19/2017       Progress Note  Subjective  Chief Complaint  Chief Complaint  Patient presents with  . Follow-up    d/c on 08/01/17- Dx Acid reflux "swallow and the flap wasn't opening right"    Patient presents for transfer of care from recent admission to hospital.    No problem-specific Assessment & Plan notes found for this encounter.   Past Medical History:  Diagnosis Date  . Anxiety   . Coronary artery disease   . Hyperlipidemia   . Hypertension   . Parkinson's disease (HCC)   . Prediabetes     Past Surgical History:  Procedure Laterality Date  . APPENDECTOMY    . CARDIAC CATHETERIZATION    . CAROTID STENT  07/2015  . CHOLECYSTECTOMY    . CORONARY ANGIOPLASTY    . CORONARY ARTERY BYPASS GRAFT    . CORONARY STENT INTERVENTION N/A 01/30/2017   Procedure: Coronary Stent Intervention;  Surgeon: Marcina Millard, MD;  Location: [facility];  Service: Cardiovascular;  Laterality: N/A;  . CORONARY STENT INTERVENTION N/A 06/13/2017   Procedure: Coronary Stent Intervention;  Surgeon: Iran Ouch, MD;  Location: [facility];  Service: Cardiovascular;  Laterality: N/A;  . EYE SURGERY Bilateral   . LEFT HEART CATH AND CORS/GRAFTS ANGIOGRAPHY N/A 01/30/2017   Procedure: Left Heart Cath and Cors/Grafts Angiography and possible PCI;  Surgeon: Marcina Millard, MD;  Location: [facility];  Service: Cardiovascular;  Laterality: N/A;  . LEFT HEART CATH AND CORS/GRAFTS ANGIOGRAPHY N/A 06/13/2017   Procedure: Left Heart Cath and Cors/Grafts Angiography;  Surgeon: Lamar Blinks, MD;  Location: [facility];  Service: Cardiovascular;  Laterality: N/A;  . NASAL SEPTUM SURGERY    . TONSILLECTOMY      Family History  Problem Relation Age of Onset  . Breast cancer Mother   . Diabetes Father   . CAD Father   . CVA Father   . Diabetes Brother     Social History    Social History  . Marital status: Single    Spouse name: N/A  . Number of children: N/A  . Years of education: N/A   Occupational History  . self employed     toe truck driver   Social History Main Topics  . Smoking status: Never Smoker  . Smokeless tobacco: Never Used  . Alcohol use No     Comment: 1 glass wine/month  . Drug use: No     Comment: hemp oil  . Sexual activity: Not on file   Other Topics Concern  . Not on file   Social History Narrative  . No narrative on file    Allergies  Allergen Reactions  . Iodinated Diagnostic Agents Nausea Only and Other (See Comments)    Reaction:  Fever   . Sulfa Antibiotics Rash    Outpatient Medications Prior to Visit  Medication Sig Dispense Refill  . acetaminophen (TYLENOL) 500 MG tablet Take 500 mg by mouth every 6 (six) hours as needed for mild pain.     Marland Kitchen albuterol (PROVENTIL HFA;VENTOLIN HFA) 108 (90 BASE) MCG/ACT inhaler Inhale 2 puffs into the lungs every 6 (six) hours as needed for wheezing or shortness of breath.    . ALPRAZolam (XANAX) 0.5 MG tablet Take 0.5 mg by mouth 3 (three) times daily. Dr Malvin Johns    . ARTIFICIAL TEAR OP Apply 1  drop to eye as needed (for dry eyes).    Marland Kitchen aspirin EC 81 MG tablet Take 81 mg by mouth daily.    . carbidopa-levodopa (SINEMET CR) 50-200 MG tablet Take 1 tablet by mouth at bedtime. 90 tablet 1  . carbidopa-levodopa (SINEMET IR) 25-100 MG tablet Take 2 tablets by mouth 6 (six) times daily. (Patient taking differently: 3/2/3/2/2) 1080 tablet 1  . carvedilol (COREG) 6.25 MG tablet Take 6.25 mg by mouth 2 (two) times daily.     . empagliflozin (JARDIANCE) 10 MG TABS tablet Take 10 mg by mouth daily. 30 tablet 11  . glucose blood test strip 1 each by Other route daily. And lancets 1/day 100 each 12  . isosorbide mononitrate (IMDUR) 30 MG 24 hr tablet Take 1 tablet (30 mg total) by mouth daily. 30 tablet 0  . lisinopril (ZESTRIL) 2.5 MG tablet Take 1 tablet (2.5 mg total) by mouth daily.  30 tablet 0  . neomycin-polymyxin-hydrocortisone (CORTISPORIN) 3.5-10000-1 OTIC suspension Place 3 drops into both ears 4 (four) times daily. 10 mL 0  . nitroGLYCERIN (NITROSTAT) 0.4 MG SL tablet Place 0.4 mg under the tongue every 5 (five) minutes as needed for chest pain.    . phenol (CHLORASEPTIC) 1.4 % LIQD Use as directed 1 spray in the mouth or throat as needed for throat irritation / pain.    . promethazine (PHENERGAN) 25 MG tablet Take 25 mg by mouth every 8 (eight) hours as needed for nausea or vomiting.    . rosuvastatin (CRESTOR) 20 MG tablet Take 20 mg by mouth at bedtime.    . ticagrelor (BRILINTA) 90 MG TABS tablet Take 90 mg by mouth every 12 (twelve) hours.    . valACYclovir (VALTREX) 500 MG tablet Take 500 mg by mouth 2 (two) times daily.    . clindamycin (CLEOCIN) 300 MG capsule Take 1 capsule by mouth 4 (four) times daily. Take 1 capsule by mouth 4 (four) times daily for 7 days.  0   No facility-administered medications prior to visit.     Review of Systems  Constitutional: Negative for chills, fever, malaise/fatigue and weight loss.  HENT: Negative for ear discharge, ear pain and sore throat.   Eyes: Negative for blurred vision.  Respiratory: Negative for cough, sputum production, shortness of breath and wheezing.   Cardiovascular: Negative for chest pain, palpitations and leg swelling.  Gastrointestinal: Positive for heartburn. Negative for abdominal pain, blood in stool, constipation, diarrhea, melena, nausea and vomiting.       Dysphagia  Genitourinary: Negative for dysuria, frequency, hematuria and urgency.  Musculoskeletal: Negative for back pain, joint pain, myalgias and neck pain.  Skin: Negative for rash.  Neurological: Negative for dizziness, tingling, sensory change, focal weakness and headaches.  Endo/Heme/Allergies: Negative for environmental allergies and polydipsia. Does not bruise/bleed easily.  Psychiatric/Behavioral: Negative for depression and suicidal  ideas. The patient is not nervous/anxious and does not have insomnia.      Objective  Vitals:   08/19/17 1518  BP: 120/80  Pulse: 78  Weight: 215 lb (97.5 kg)  Height: 6' (1.829 m)    Physical Exam  Constitutional: He is oriented to person, place, and time and well-developed, well-nourished, and in no distress.  HENT:  Head: Normocephalic.  Right Ear: External ear normal.  Left Ear: External ear normal.  Nose: Nose normal.  Mouth/Throat: Oropharynx is clear and moist.  Eyes: Pupils are equal, round, and reactive to light. Conjunctivae and EOM are normal. Right eye exhibits no discharge.  Left eye exhibits no discharge. No scleral icterus.  Neck: Normal range of motion. Neck supple. No JVD present. No tracheal deviation present. No thyromegaly present.  Cardiovascular: Normal rate, regular rhythm, normal heart sounds and intact distal pulses.  Exam reveals no gallop and no friction rub.   No murmur heard. Pulmonary/Chest: Breath sounds normal. No respiratory distress. He has no wheezes. He has no rales.  Abdominal: Soft. Bowel sounds are normal. He exhibits no mass. There is no hepatosplenomegaly. There is no tenderness. There is no rebound, no guarding and no CVA tenderness.  Musculoskeletal: He exhibits no edema.       Left shoulder: He exhibits decreased range of motion and tenderness.  Lymphadenopathy:    He has no cervical adenopathy.  Neurological: He is alert and oriented to person, place, and time. He has normal sensation, normal strength, normal reflexes and intact cranial nerves. No cranial nerve deficit.  Skin: Skin is warm. No rash noted.  Psychiatric: Mood and affect normal.  Nursing note and vitals reviewed.     Assessment & Plan  Problem List Items Addressed This Visit      Digestive   Dysphagia    Other Visit Diagnoses    Hospital discharge follow-up    -  Primary   Impingement syndrome of left shoulder       Relevant Orders   Ambulatory referral to  Orthopedic Surgery      No orders of the defined types were placed in this encounter.     Dr. [facility]  08/19/17

## 2017-09-01 ENCOUNTER — Other Ambulatory Visit: Payer: Self-pay | Admitting: Neurology

## 2017-09-02 ENCOUNTER — Ambulatory Visit: Payer: BLUE CROSS/BLUE SHIELD | Admitting: Neurology

## 2017-09-02 ENCOUNTER — Other Ambulatory Visit: Payer: Self-pay

## 2017-09-10 ENCOUNTER — Encounter: Payer: Self-pay | Admitting: Family Medicine

## 2017-09-10 ENCOUNTER — Ambulatory Visit (INDEPENDENT_AMBULATORY_CARE_PROVIDER_SITE_OTHER): Payer: BLUE CROSS/BLUE SHIELD | Admitting: Family Medicine

## 2017-09-10 ENCOUNTER — Ambulatory Visit
Admission: RE | Admit: 2017-09-10 | Discharge: 2017-09-10 | Disposition: A | Discharge status: Home / Self Care | Payer: BLUE CROSS/BLUE SHIELD | Source: Ambulatory Visit | Attending: Family Medicine | Admitting: Family Medicine

## 2017-09-10 VITALS — BP 120/80 | HR 68 | Ht 72.0 in | Wt 215.0 lb

## 2017-09-10 DIAGNOSIS — J0110 Acute frontal sinusitis, unspecified: Secondary | ICD-10-CM

## 2017-09-10 MED ORDER — FLUTICASONE PROPIONATE 50 MCG/ACT NA SUSP: 2.0000 | Freq: Every day | NASAL | 6 refills | Status: DC

## 2017-09-10 NOTE — Progress Notes (Signed)
Name: Sean Barajas   MRN: [id number]    DOB: [birth year]   Date:09/10/2017       Progress Note  Subjective  Chief Complaint  Chief Complaint  Patient presents with  . Follow-up    Dr Mechele Collin wanted him to follow up for possible pink eye. R) eye feels better now but having headache above R) eye- on day 6 of cefdinir    Headache   This is a new problem. The current episode started in the past 7 days. The problem occurs daily. The problem has been waxing and waning. The pain is located in the right unilateral and frontal region. The pain does not radiate. The pain quality is not similar to prior headaches. The quality of the pain is described as aching. The pain is at a severity of 9/10. The pain is mild. Associated symptoms include dizziness, eye pain, eye redness, eye watering, a loss of balance, rhinorrhea, sinus pressure and tinnitus. Pertinent negatives include no abdominal pain, abnormal behavior, anorexia, back pain, blurred vision, coughing, drainage, ear pain, facial sweating, fever, hearing loss, insomnia, muscle aches, nausea, neck pain, numbness, phonophobia, photophobia, scalp tenderness, seizures, sore throat, swollen glands, tingling, visual change, vomiting, weakness or weight loss. Associated symptoms comments: Recent eye exam. The treatment provided moderate relief. His past medical history is significant for recent head traumas. There is no history of cluster headaches, migraine headaches or sinus disease.    No problem-specific Assessment & Plan notes found for this encounter.   Past Medical History:  Diagnosis Date  . Anxiety   . Coronary artery disease   . Hyperlipidemia   . Hypertension   . Parkinson's disease (HCC)   . Prediabetes     Past Surgical History:  Procedure Laterality Date  . APPENDECTOMY    . CARDIAC CATHETERIZATION    . CAROTID STENT  07/2015  . CHOLECYSTECTOMY    . CORONARY ANGIOPLASTY    . CORONARY ARTERY BYPASS GRAFT    . CORONARY STENT  INTERVENTION N/A 01/30/2017   Procedure: Coronary Stent Intervention;  Surgeon: Marcina Millard, MD;  Location: [facility];  Service: Cardiovascular;  Laterality: N/A;  . CORONARY STENT INTERVENTION N/A 06/13/2017   Procedure: Coronary Stent Intervention;  Surgeon: Iran Ouch, MD;  Location: [facility];  Service: Cardiovascular;  Laterality: N/A;  . EYE SURGERY Bilateral   . LEFT HEART CATH AND CORS/GRAFTS ANGIOGRAPHY N/A 01/30/2017   Procedure: Left Heart Cath and Cors/Grafts Angiography and possible PCI;  Surgeon: Marcina Millard, MD;  Location: [facility];  Service: Cardiovascular;  Laterality: N/A;  . LEFT HEART CATH AND CORS/GRAFTS ANGIOGRAPHY N/A 06/13/2017   Procedure: Left Heart Cath and Cors/Grafts Angiography;  Surgeon: Lamar Blinks, MD;  Location: [facility];  Service: Cardiovascular;  Laterality: N/A;  . NASAL SEPTUM SURGERY    . TONSILLECTOMY      Family History  Problem Relation Age of Onset  . Breast cancer Mother   . Diabetes Father   . CAD Father   . CVA Father   . Diabetes Brother     Social History   Social History  . Marital status: Single    Spouse name: N/A  . Number of children: N/A  . Years of education: N/A   Occupational History  . self employed     toe truck driver   Social History Main Topics  . Smoking status: Never Smoker  . Smokeless tobacco: Never Used  . Alcohol  use No     Comment: 1 glass wine/month  . Drug use: No     Comment: hemp oil  . Sexual activity: Not on file   Other Topics Concern  . Not on file   Social History Narrative  . No narrative on file    Allergies  Allergen Reactions  . Iodinated Diagnostic Agents Nausea Only and Other (See Comments)    Reaction:  Fever   . Sulfa Antibiotics Rash    Outpatient Medications Prior to Visit  Medication Sig Dispense Refill  . acetaminophen (TYLENOL) 500 MG tablet Take 500 mg by mouth every 6 (six) hours as needed for  mild pain.     Marland Kitchen albuterol (PROVENTIL HFA;VENTOLIN HFA) 108 (90 BASE) MCG/ACT inhaler Inhale 2 puffs into the lungs every 6 (six) hours as needed for wheezing or shortness of breath.    . ALPRAZolam (XANAX) 0.5 MG tablet Take 0.5 mg by mouth 3 (three) times daily. Dr Malvin Johns    . ARTIFICIAL TEAR OP Apply 1 drop to eye as needed (for dry eyes).    Marland Kitchen aspirin EC 81 MG tablet Take 81 mg by mouth daily.    . carbidopa-levodopa (SINEMET CR) 50-200 MG tablet Take 1 tablet by mouth at bedtime. 90 tablet 1  . carbidopa-levodopa (SINEMET IR) 25-100 MG tablet 3 in the morning/2 midmorning/3afternoon/2 midafternoon/2 evening 1080 tablet 1  . carvedilol (COREG) 6.25 MG tablet Take 6.25 mg by mouth 2 (two) times daily.     . empagliflozin (JARDIANCE) 10 MG TABS tablet Take 10 mg by mouth daily. 30 tablet 11  . glucose blood test strip 1 each by Other route daily. And lancets 1/day 100 each 12  . isosorbide mononitrate (IMDUR) 30 MG 24 hr tablet Take 1 tablet (30 mg total) by mouth daily. 30 tablet 0  . lisinopril (ZESTRIL) 2.5 MG tablet Take 1 tablet (2.5 mg total) by mouth daily. 30 tablet 0  . neomycin-polymyxin-hydrocortisone (CORTISPORIN) 3.5-10000-1 OTIC suspension Place 3 drops into both ears 4 (four) times daily. 10 mL 0  . nitroGLYCERIN (NITROSTAT) 0.4 MG SL tablet Place 0.4 mg under the tongue every 5 (five) minutes as needed for chest pain.    . phenol (CHLORASEPTIC) 1.4 % LIQD Use as directed 1 spray in the mouth or throat as needed for throat irritation / pain.    . promethazine (PHENERGAN) 25 MG tablet Take 25 mg by mouth every 8 (eight) hours as needed for nausea or vomiting.    . rosuvastatin (CRESTOR) 20 MG tablet Take 20 mg by mouth at bedtime.    . ticagrelor (BRILINTA) 90 MG TABS tablet Take 90 mg by mouth every 12 (twelve) hours.    . valACYclovir (VALTREX) 500 MG tablet Take 500 mg by mouth 2 (two) times daily.     No facility-administered medications prior to visit.     Review of  Systems  Constitutional: Negative for chills, fever, malaise/fatigue and weight loss.  HENT: Positive for rhinorrhea, sinus pressure and tinnitus. Negative for ear discharge, ear pain, hearing loss and sore throat.   Eyes: Positive for pain and redness. Negative for blurred vision and photophobia.  Respiratory: Negative for cough, sputum production, shortness of breath and wheezing.   Cardiovascular: Negative for chest pain, palpitations and leg swelling.  Gastrointestinal: Negative for abdominal pain, anorexia, blood in stool, constipation, diarrhea, heartburn, melena, nausea and vomiting.  Genitourinary: Negative for dysuria, frequency, hematuria and urgency.  Musculoskeletal: Negative for back pain, joint pain, myalgias and neck  pain.  Skin: Negative for rash.  Neurological: Positive for dizziness, headaches and loss of balance. Negative for tingling, sensory change, focal weakness, seizures, weakness and numbness.  Endo/Heme/Allergies: Negative for environmental allergies and polydipsia. Does not bruise/bleed easily.  Psychiatric/Behavioral: Negative for depression and suicidal ideas. The patient is not nervous/anxious and does not have insomnia.      Objective  Vitals:   09/10/17 1115  BP: 120/80  Pulse: 68  Weight: 215 lb (97.5 kg)  Height: 6' (1.829 m)    Physical Exam  Constitutional: He is oriented to person, place, and time and well-developed, well-nourished, and in no distress.  HENT:  Head: Normocephalic.  Right Ear: External ear normal.  Left Ear: External ear normal.  Nose: Nose normal.  Mouth/Throat: Oropharynx is clear and moist.  Eyes: Pupils are equal, round, and reactive to light. Conjunctivae and EOM are normal. Right eye exhibits no discharge. Left eye exhibits no discharge. No scleral icterus.  Neck: Normal range of motion. Neck supple. No JVD present. No tracheal deviation present. No thyromegaly present.  Cardiovascular: Normal rate, regular rhythm, normal  heart sounds and intact distal pulses.  Exam reveals no gallop and no friction rub.   No murmur heard. Pulmonary/Chest: Breath sounds normal. No respiratory distress. He has no wheezes. He has no rales.  Abdominal: Soft. Bowel sounds are normal. He exhibits no mass. There is no hepatosplenomegaly. There is no tenderness. There is no rebound, no guarding and no CVA tenderness.  Musculoskeletal: Normal range of motion. He exhibits no edema or tenderness.  Lymphadenopathy:    He has no cervical adenopathy.  Neurological: He is alert and oriented to person, place, and time. He has normal sensation, normal strength, normal reflexes and intact cranial nerves. No cranial nerve deficit.  Skin: Skin is warm. No rash noted.  Psychiatric: Mood and affect normal.  Nursing note and vitals reviewed.     Assessment & Plan  Problem List Items Addressed This Visit    None    Visit Diagnoses    Acute frontal sinusitis, recurrence not specified    -  Primary   cont current medications   Relevant Medications   cefdinir (OMNICEF) 300 MG capsule   fluticasone (FLONASE) 50 MCG/ACT nasal spray   Other Relevant Orders   DG Sinuses Complete      Meds ordered this encounter  Medications  . fluticasone (FLONASE) 50 MCG/ACT nasal spray    Sig: Place 2 sprays into both nostrils daily.    Dispense:  16 g    Refill:  6      Dr. [facility]  09/10/17
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
## 2017-09-19 ENCOUNTER — Encounter: Payer: Self-pay | Admitting: Neurology

## 2017-09-19 ENCOUNTER — Ambulatory Visit (INDEPENDENT_AMBULATORY_CARE_PROVIDER_SITE_OTHER): Payer: BLUE CROSS/BLUE SHIELD | Admitting: Neurology

## 2017-09-19 VITALS — BP 110/80 | HR 58 | Ht 71.0 in | Wt 213.0 lb

## 2017-09-19 DIAGNOSIS — G20 Parkinson's disease: Secondary | ICD-10-CM | POA: Diagnosis not present

## 2017-09-19 DIAGNOSIS — R1319 Other dysphagia: Secondary | ICD-10-CM

## 2017-09-25 ENCOUNTER — Encounter: Payer: Self-pay | Admitting: Endocrinology

## 2017-09-25 ENCOUNTER — Telehealth: Payer: Self-pay | Admitting: Neurology

## 2017-09-25 ENCOUNTER — Ambulatory Visit (INDEPENDENT_AMBULATORY_CARE_PROVIDER_SITE_OTHER): Payer: BLUE CROSS/BLUE SHIELD | Admitting: Endocrinology

## 2017-09-25 VITALS — BP 120/72 | HR 59 | Wt 211.0 lb

## 2017-09-25 DIAGNOSIS — E1159 Type 2 diabetes mellitus with other circulatory complications: Secondary | ICD-10-CM

## 2017-09-25 LAB — POCT GLYCOSYLATED HEMOGLOBIN (HGB A1C): HEMOGLOBIN A1C: 6.9

## 2017-09-25 MED ORDER — METFORMIN HCL ER 500 MG PO TB24: 500.0000 mg | ORAL_TABLET | Freq: Every day | ORAL | 3 refills | Status: DC

## 2017-09-25 NOTE — Progress Notes (Signed)
Subjective:    Patient ID: Sean EdisonRobert M Flaim, male    DOB: 05/15/1956, 61 y.o.   MRN: [id number]  HPI  Pt returns for f/u of diabetes mellitus: DM type: 2 Dx'ed: 2018 Complications: CAD Therapy: jardiance DKA: never Severe hypoglycemia: never Pancreatitis: never Pancreatic imaging: normal on 2018 CT Other: he has never been on insulin Interval history: nausea is resolved.  pt states he feels well in general. Past Medical History:  Diagnosis Date  . Anxiety   . Coronary artery disease   . Hyperlipidemia   . Hypertension   . Parkinson's disease (HCC)   . Prediabetes     Past Surgical History:  Procedure Laterality Date  . APPENDECTOMY    . CARDIAC CATHETERIZATION    . CAROTID STENT  07/2015  . CHOLECYSTECTOMY    . CORONARY ANGIOPLASTY    . CORONARY ARTERY BYPASS GRAFT    . CORONARY STENT INTERVENTION N/A 01/30/2017   Procedure: Coronary Stent Intervention;  Surgeon: Marcina MillardAlexander Paraschos, MD;  Location: [facility];  Service: Cardiovascular;  Laterality: N/A;  . CORONARY STENT INTERVENTION N/A 06/13/2017   Procedure: Coronary Stent Intervention;  Surgeon: Iran OuchArida, Muhammad A, MD;  Location: [facility];  Service: Cardiovascular;  Laterality: N/A;  . EYE SURGERY Bilateral   . LEFT HEART CATH AND CORS/GRAFTS ANGIOGRAPHY N/A 01/30/2017   Procedure: Left Heart Cath and Cors/Grafts Angiography and possible PCI;  Surgeon: Marcina MillardAlexander Paraschos, MD;  Location: [facility];  Service: Cardiovascular;  Laterality: N/A;  . LEFT HEART CATH AND CORS/GRAFTS ANGIOGRAPHY N/A 06/13/2017   Procedure: Left Heart Cath and Cors/Grafts Angiography;  Surgeon: Lamar BlinksKowalski, Bruce J, MD;  Location: [facility];  Service: Cardiovascular;  Laterality: N/A;  . NASAL SEPTUM SURGERY    . TONSILLECTOMY      Social History   Social History  . Marital status: Single    Spouse name: N/A  . Number of children: N/A  . Years of education: N/A   Occupational History  . self  employed     toe truck driver   Social History Main Topics  . Smoking status: Never Smoker  . Smokeless tobacco: Never Used  . Alcohol use No     Comment: 1 glass wine/month  . Drug use: No     Comment: hemp oil  . Sexual activity: Not on file   Other Topics Concern  . Not on file   Social History Narrative  . No narrative on file    Current Outpatient Prescriptions on File Prior to Visit  Medication Sig Dispense Refill  . acetaminophen (TYLENOL) 500 MG tablet Take 500 mg by mouth every 6 (six) hours as needed for mild pain.     Marland Kitchen. albuterol (PROVENTIL HFA;VENTOLIN HFA) 108 (90 BASE) MCG/ACT inhaler Inhale 2 puffs into the lungs every 6 (six) hours as needed for wheezing or shortness of breath.    . ALPRAZolam (XANAX) 0.5 MG tablet Take 0.5 mg by mouth 3 (three) times daily. Dr Malvin JohnsPotter    . ARTIFICIAL TEAR OP Apply 1 drop to eye as needed (for dry eyes).    Marland Kitchen. aspirin EC 81 MG tablet Take 81 mg by mouth daily.    . carbidopa-levodopa (SINEMET CR) 50-200 MG tablet Take 1 tablet by mouth at bedtime. 90 tablet 1  . carbidopa-levodopa (SINEMET IR) 25-100 MG tablet 3 in the morning/2 midmorning/3afternoon/2 midafternoon/2 evening (Patient taking differently: Take 3 tablets by mouth 4 (four) times daily. ) 1080 tablet 1  .  carvedilol (COREG) 6.25 MG tablet Take 6.25 mg by mouth 2 (two) times daily.     . empagliflozin (JARDIANCE) 10 MG TABS tablet Take 10 mg by mouth daily. 30 tablet 11  . fluticasone (FLONASE) 50 MCG/ACT nasal spray Place 2 sprays into both nostrils daily. 16 g 6  . glucose blood test strip 1 each by Other route daily. And lancets 1/day 100 each 12  . isosorbide mononitrate (IMDUR) 30 MG 24 hr tablet Take 1 tablet (30 mg total) by mouth daily. 30 tablet 0  . lisinopril (ZESTRIL) 2.5 MG tablet Take 1 tablet (2.5 mg total) by mouth daily. 30 tablet 0  . neomycin-polymyxin-hydrocortisone (CORTISPORIN) 3.5-10000-1 OTIC suspension Place 3 drops into both ears 4 (four) times  daily. 10 mL 0  . nitroGLYCERIN (NITROSTAT) 0.4 MG SL tablet Place 0.4 mg under the tongue every 5 (five) minutes as needed for chest pain.    . phenol (CHLORASEPTIC) 1.4 % LIQD Use as directed 1 spray in the mouth or throat as needed for throat irritation / pain.    . promethazine (PHENERGAN) 25 MG tablet Take 25 mg by mouth every 8 (eight) hours as needed for nausea or vomiting.    . rosuvastatin (CRESTOR) 20 MG tablet Take 20 mg by mouth at bedtime.    . ticagrelor (BRILINTA) 90 MG TABS tablet Take 90 mg by mouth every 12 (twelve) hours.    . valACYclovir (VALTREX) 500 MG tablet Take 500 mg by mouth 2 (two) times daily.     No current facility-administered medications on file prior to visit.     Allergies  Allergen Reactions  . Iodinated Diagnostic Agents Nausea Only and Other (See Comments)    Reaction:  Fever   . Sulfa Antibiotics Rash    Family History  Problem Relation Age of Onset  . Breast cancer Mother   . Diabetes Father   . CAD Father   . CVA Father   . Diabetes Brother     BP 120/72   Pulse (!) 59   Wt 211 lb (95.7 kg)   SpO2 96%   BMI 29.43 kg/m   Review of Systems He has lost 5 lbs.      Objective:   Physical Exam VITAL SIGNS:  See vs page GENERAL: no distress Pulses: foot pulses are intact bilaterally.   MSK: no deformity of the feet or ankles.  CV: trace bilat leg edema of the legs or ankles Skin:  no ulcer on the feet or ankles.  normal color and temp on the feet and ankles.  Old healed surgical scar (vein harvest) at the left leg.  Neuro: sensation is intact to touch on the feet and ankles.     A1c=6.9%  Lab Results  Component Value Date   CREATININE 0.84 08/01/2017   BUN 16 08/01/2017   NA 136 08/01/2017   K 3.9 08/01/2017   CL 106 08/01/2017   CO2 25 08/01/2017      Assessment & Plan:  Type 2 DM, with CAD: he needs increased rx, if it can be done with a regimen that avoids or minimizes hypoglycemia.     Patient Instructions  check  your blood sugar once a day.  vary the time of day when you check, between before the 3 meals, and at bedtime.  also check if you have symptoms of your blood sugar being too high or too low.  please keep a record of the readings and bring it to your next appointment  here (or you can bring the meter itself).  You can write it on any piece of paper.  please call us sooner if your blood sugar goes below 70, or if you have a lot of readings over 200. Please continue the same jardiance, and:  I have sent a prescription to your pharmacy, to add metformin.  Please come back for a follow-up appointment in 3 months.

## 2017-09-25 NOTE — Telephone Encounter (Signed)
New Message  Sean Barajas verbalized on VM she was calling about for prior authorization for medication Arbutin and to give her a call back.  Call back contact info: [phone], Option 3

## 2017-09-25 NOTE — Patient Instructions (Addendum)
check your blood sugar once a day.  vary the time of day when you check, between before the 3 meals, and at bedtime.  also check if you have symptoms of your blood sugar being too high or too low.  please keep a record of the readings and bring it to your next appointment here (or you can bring the meter itself).  You can write it on any piece of paper.  please call us sooner if your blood sugar goes below 70, or if you have a lot of readings over 200. Please continue the same jardiance, and:  I have sent a prescription to your pharmacy, to add metformin.  Please come back for a follow-up appointment in 3 months.

## 2017-09-26 NOTE — Telephone Encounter (Signed)
Made aware [facility]

## 2017-09-30 ENCOUNTER — Encounter: Payer: Self-pay | Admitting: Family Medicine

## 2017-09-30 ENCOUNTER — Ambulatory Visit (INDEPENDENT_AMBULATORY_CARE_PROVIDER_SITE_OTHER): Payer: BLUE CROSS/BLUE SHIELD | Admitting: Family Medicine

## 2017-09-30 VITALS — BP 138/80 | HR 80 | Ht 71.0 in | Wt 212.0 lb

## 2017-09-30 DIAGNOSIS — L739 Follicular disorder, unspecified: Secondary | ICD-10-CM | POA: Diagnosis not present

## 2017-09-30 DIAGNOSIS — M7041 Prepatellar bursitis, right knee: Secondary | ICD-10-CM

## 2017-09-30 MED ORDER — MUPIROCIN 2 % EX OINT: 1.0000 "application " | TOPICAL_OINTMENT | Freq: Two times a day (BID) | CUTANEOUS | 0 refills | Status: DC

## 2017-09-30 MED ORDER — CHLORHEXIDINE GLUCONATE 4 % EX LIQD: Freq: Every day | CUTANEOUS | 0 refills | Status: DC | PRN

## 2017-09-30 MED ORDER — CEPHALEXIN 500 MG PO CAPS: 500.0000 mg | ORAL_CAPSULE | Freq: Four times a day (QID) | ORAL | 1 refills | Status: DC

## 2017-09-30 NOTE — Progress Notes (Signed)
Name: Sean Barajas   MRN: [id number]    DOB: 03/25/1956   Date:09/30/2017       Progress Note  Subjective  Chief Complaint  Chief Complaint  Patient presents with  . Follow-up    urgent care- has 4 pills left of doxy/ question of getting in with rheumatology    Rash  This is a new problem. The current episode started in the past 7 days. The problem has been gradually worsening since onset. The affected locations include the groin, torso and right buttock. The rash is characterized by redness and swelling. He was exposed to nothing. Pertinent negatives include no anorexia, congestion, cough, diarrhea, eye pain, facial edema, fatigue, fever, joint pain, nail changes, rhinorrhea, shortness of breath, sore throat or vomiting. Past treatments include antibiotics (doxycycline). The treatment provided moderate relief.    No problem-specific Assessment & Plan notes found for this encounter.   Past Medical History:  Diagnosis Date  . Anxiety   . Coronary artery disease   . Hyperlipidemia   . Hypertension   . Parkinson's disease (HCC)   . Prediabetes     Past Surgical History:  Procedure Laterality Date  . APPENDECTOMY    . CARDIAC CATHETERIZATION    . CAROTID STENT  07/2015  . CHOLECYSTECTOMY    . CORONARY ANGIOPLASTY    . CORONARY ARTERY BYPASS GRAFT    . CORONARY STENT INTERVENTION N/A 01/30/2017   Procedure: Coronary Stent Intervention;  Surgeon: Marcina MillardAlexander Paraschos, MD;  Location: [facility];  Service: Cardiovascular;  Laterality: N/A;  . CORONARY STENT INTERVENTION N/A 06/13/2017   Procedure: Coronary Stent Intervention;  Surgeon: Iran OuchArida, Muhammad A, MD;  Location: [facility];  Service: Cardiovascular;  Laterality: N/A;  . EYE SURGERY Bilateral   . LEFT HEART CATH AND CORS/GRAFTS ANGIOGRAPHY N/A 01/30/2017   Procedure: Left Heart Cath and Cors/Grafts Angiography and possible PCI;  Surgeon: Marcina MillardAlexander Paraschos, MD;  Location: [facility];  Service:  Cardiovascular;  Laterality: N/A;  . LEFT HEART CATH AND CORS/GRAFTS ANGIOGRAPHY N/A 06/13/2017   Procedure: Left Heart Cath and Cors/Grafts Angiography;  Surgeon: Lamar BlinksKowalski, Bruce J, MD;  Location: [facility];  Service: Cardiovascular;  Laterality: N/A;  . NASAL SEPTUM SURGERY    . TONSILLECTOMY      Family History  Problem Relation Age of Onset  . Breast cancer Mother   . Diabetes Father   . CAD Father   . CVA Father   . Diabetes Brother     Social History   Social History  . Marital status: Single    Spouse name: N/A  . Number of children: N/A  . Years of education: N/A   Occupational History  . self employed     toe truck driver   Social History Main Topics  . Smoking status: Never Smoker  . Smokeless tobacco: Never Used  . Alcohol use No     Comment: 1 glass wine/month  . Drug use: No     Comment: hemp oil  . Sexual activity: Not on file   Other Topics Concern  . Not on file   Social History Narrative  . No narrative on file    Allergies  Allergen Reactions  . Iodinated Diagnostic Agents Nausea Only and Other (See Comments)    Reaction:  Fever   . Sulfa Antibiotics Rash    Outpatient Medications Prior to Visit  Medication Sig Dispense Refill  . acetaminophen (TYLENOL) 500 MG tablet Take 500  mg by mouth every 6 (six) hours as needed for mild pain.     Marland Kitchen albuterol (PROVENTIL HFA;VENTOLIN HFA) 108 (90 BASE) MCG/ACT inhaler Inhale 2 puffs into the lungs every 6 (six) hours as needed for wheezing or shortness of breath.    . ALPRAZolam (XANAX) 0.5 MG tablet Take 0.5 mg by mouth 3 (three) times daily. Dr Malvin Johns    . ARTIFICIAL TEAR OP Apply 1 drop to eye as needed (for dry eyes).    Marland Kitchen aspirin EC 81 MG tablet Take 81 mg by mouth daily.    . carbidopa-levodopa (SINEMET CR) 50-200 MG tablet Take 1 tablet by mouth at bedtime. 90 tablet 1  . carbidopa-levodopa (SINEMET IR) 25-100 MG tablet 3 in the morning/2 midmorning/3afternoon/2 midafternoon/2 evening  (Patient taking differently: Take 3 tablets by mouth 4 (four) times daily. ) 1080 tablet 1  . carvedilol (COREG) 6.25 MG tablet Take 6.25 mg by mouth 2 (two) times daily.     . empagliflozin (JARDIANCE) 10 MG TABS tablet Take 10 mg by mouth daily. 30 tablet 11  . fluticasone (FLONASE) 50 MCG/ACT nasal spray Place 2 sprays into both nostrils daily. 16 g 6  . glucose blood test strip 1 each by Other route daily. And lancets 1/day 100 each 12  . isosorbide mononitrate (IMDUR) 30 MG 24 hr tablet Take 1 tablet (30 mg total) by mouth daily. 30 tablet 0  . lisinopril (ZESTRIL) 2.5 MG tablet Take 1 tablet (2.5 mg total) by mouth daily. 30 tablet 0  . metFORMIN (GLUCOPHAGE-XR) 500 MG 24 hr tablet Take 1 tablet (500 mg total) by mouth daily with breakfast. 90 tablet 3  . neomycin-polymyxin-hydrocortisone (CORTISPORIN) 3.5-10000-1 OTIC suspension Place 3 drops into both ears 4 (four) times daily. 10 mL 0  . nitroGLYCERIN (NITROSTAT) 0.4 MG SL tablet Place 0.4 mg under the tongue every 5 (five) minutes as needed for chest pain.    . phenol (CHLORASEPTIC) 1.4 % LIQD Use as directed 1 spray in the mouth or throat as needed for throat irritation / pain.    . promethazine (PHENERGAN) 25 MG tablet Take 25 mg by mouth every 8 (eight) hours as needed for nausea or vomiting.    . rosuvastatin (CRESTOR) 20 MG tablet Take 20 mg by mouth at bedtime.    . ticagrelor (BRILINTA) 90 MG TABS tablet Take 90 mg by mouth every 12 (twelve) hours.    . valACYclovir (VALTREX) 500 MG tablet Take 500 mg by mouth 2 (two) times daily.     No facility-administered medications prior to visit.     Review of Systems  Constitutional: Negative for chills, fatigue, fever, malaise/fatigue and weight loss.  HENT: Negative for congestion, ear discharge, ear pain, rhinorrhea and sore throat.   Eyes: Negative for blurred vision and pain.  Respiratory: Negative for cough, sputum production, shortness of breath and wheezing.   Cardiovascular:  Negative for chest pain, palpitations and leg swelling.  Gastrointestinal: Negative for abdominal pain, anorexia, blood in stool, constipation, diarrhea, heartburn, melena, nausea and vomiting.  Genitourinary: Negative for dysuria, frequency, hematuria and urgency.  Musculoskeletal: Negative for back pain, joint pain, myalgias and neck pain.  Skin: Positive for rash. Negative for nail changes.  Neurological: Negative for dizziness, tingling, sensory change, focal weakness and headaches.  Endo/Heme/Allergies: Negative for environmental allergies and polydipsia. Does not bruise/bleed easily.  Psychiatric/Behavioral: Negative for depression and suicidal ideas. The patient is not nervous/anxious and does not have insomnia.      Objective  Vitals:   09/30/17 1538  BP: 138/80  Pulse: 80  Weight: 212 lb (96.2 kg)  Height: 5\' 11"  (1.803 m)    Physical Exam  Constitutional: He is oriented to person, place, and time and well-developed, well-nourished, and in no distress.  HENT:  Head: Normocephalic.  Right Ear: External ear normal.  Left Ear: External ear normal.  Nose: Nose normal.  Mouth/Throat: Oropharynx is clear and moist.  Eyes: Pupils are equal, round, and reactive to light. Conjunctivae and EOM are normal. Right eye exhibits no discharge. Left eye exhibits no discharge. No scleral icterus.  Neck: Normal range of motion. Neck supple. No JVD present. No tracheal deviation present. No thyromegaly present.  Cardiovascular: Normal rate, regular rhythm, normal heart sounds and intact distal pulses.  Exam reveals no gallop and no friction rub.   No murmur heard. Pulmonary/Chest: Breath sounds normal. No respiratory distress. He has no wheezes. He has no rales.  Abdominal: Soft. Bowel sounds are normal. He exhibits no mass. There is no hepatosplenomegaly. There is no tenderness. There is no rebound, no guarding and no CVA tenderness.  Musculoskeletal: Normal range of motion. He exhibits no  edema or tenderness.  Lymphadenopathy:    He has no cervical adenopathy.  Neurological: He is alert and oriented to person, place, and time. He has normal sensation, normal strength and intact cranial nerves. No cranial nerve deficit.  Skin: Skin is warm. No rash noted. There is erythema.  Follicular infection buttock/rightgroin   Psychiatric: Mood and affect normal.  Nursing note and vitals reviewed.     Assessment & Plan  Problem List Items Addressed This Visit    None    Visit Diagnoses    Folliculitis    -  Primary   Relevant Medications   cephALEXin (KEFLEX) 500 MG capsule   mupirocin ointment (BACTROBAN) 2 %   chlorhexidine (HIBICLENS) 4 % external liquid   Prepatellar bursitis of right knee       s/p fall      Meds ordered this encounter  Medications  . cephALEXin (KEFLEX) 500 MG capsule    Sig: Take 1 capsule (500 mg total) by mouth 4 (four) times daily.    Dispense:  40 capsule    Refill:  1  . mupirocin ointment (BACTROBAN) 2 %    Sig: Apply 1 application topically 2 (two) times daily.    Dispense:  22 g    Refill:  0  . chlorhexidine (HIBICLENS) 4 % external liquid    Sig: Apply topically daily as needed.    Dispense:  120 mL    Refill:  0      Dr. [facility]  09/30/17

## 2017-09-30 NOTE — Patient Instructions (Signed)

## 2017-10-07 ENCOUNTER — Telehealth: Payer: Self-pay | Admitting: Psychology

## 2017-10-07 NOTE — Telephone Encounter (Signed)
Tc to Sean Barajas to see if he would like to set up a time to talk with me some about some of his needs. He is open to making an appointment. He stated that he will soon be starting a medication and will be coming into our office the first time that it is administered. The nurse with the pharmaceutical company will be in contact to schedule something per the patient.  I told him that I would be mire than happy to meet with him if that is what happens or if would like to come in at another time shall that medication be administered with a nurse at his home. He is aware of how to get in touch with me.

## 2017-10-09 ENCOUNTER — Other Ambulatory Visit: Payer: Self-pay

## 2017-10-14 ENCOUNTER — Other Ambulatory Visit: Payer: Self-pay | Admitting: Neurology

## 2017-10-16 ENCOUNTER — Telehealth: Payer: Self-pay | Admitting: Neurology

## 2017-10-16 MED ORDER — TRIMETHOBENZAMIDE HCL 300 MG PO CAPS: 300.0000 mg | ORAL_CAPSULE | Freq: Three times a day (TID) | ORAL | 1 refills | Status: DC

## 2017-10-16 NOTE — Telephone Encounter (Signed)
LM with Dr. Darrold JunkerParaschos office [phone]), patient's cardiologist, to make sure okay to proceed with Apokyn since they are worried about starting it in the home. Awaiting call back.

## 2017-10-16 NOTE — Telephone Encounter (Signed)
Leonette Mostharles, nurse with [facility] [phone]), called and wanted to initiate Apokyn in our office. He would facilitate everything, but due to his history of heart trouble he wanted him to be in a safer place and not at home for initiation. Okay to facilitate?

## 2017-10-16 NOTE — Telephone Encounter (Signed)
I also sent Tigan RX to pharmacy as stated on Apokyn start form - for him to start three days prior to start of Apokyn. Pharmacist called and wanted to know if alternative to this drug is okay. They have to order and will be $160 copay. Please advise.

## 2017-10-16 NOTE — Telephone Encounter (Signed)
I don't think so.  I think that it has to be tigan.  Confirm with apokyn RN

## 2017-10-18 ENCOUNTER — Telehealth: Payer: Self-pay | Admitting: Neurology

## 2017-10-18 NOTE — Telephone Encounter (Signed)
Tried to call back but his assistant is gone for the day. Will call me back Monday.

## 2017-10-18 NOTE — Telephone Encounter (Signed)
[facility] and an Apokyn injection for Parkinson's and that the injection needs to be done in the office and was advised to call  CB# [phone]

## 2017-10-21 ENCOUNTER — Telehealth: Payer: Self-pay | Admitting: Neurology

## 2017-10-21 NOTE — Telephone Encounter (Signed)
Any questions you can call Sandy at [phone]

## 2017-10-21 NOTE — Telephone Encounter (Signed)
Sandy with Dr Laurena SlimmerParrish's office left a voicemail message regarding the apekin (spelling) injection and it was ok to take

## 2017-10-21 NOTE — Telephone Encounter (Signed)
Received okay from heart doctor for Apokyn. Spoke with Antoine Primasharles, Apokyn RN, who will help facilitate Tigan RX for cheaper. It does have to be this medication.   He will initiate medication in our office on 11/04/2017 at 9:00 am.

## 2017-10-31 ENCOUNTER — Encounter: Payer: Self-pay | Admitting: *Deleted

## 2017-11-01 ENCOUNTER — Encounter: Payer: Self-pay | Admitting: *Deleted

## 2017-11-01 ENCOUNTER — Ambulatory Visit
Admission: RE | Admit: 2017-11-01 | Discharge: 2017-11-01 | Disposition: A | Discharge status: Home / Self Care | Payer: BLUE CROSS/BLUE SHIELD | Source: Ambulatory Visit | Attending: Unknown Physician Specialty | Admitting: Unknown Physician Specialty

## 2017-11-01 ENCOUNTER — Ambulatory Visit: Payer: BLUE CROSS/BLUE SHIELD | Admitting: Anesthesiology

## 2017-11-01 ENCOUNTER — Encounter
Admission: RE | Disposition: A | Discharge status: Home / Self Care | Payer: Self-pay | Source: Ambulatory Visit | Attending: Unknown Physician Specialty

## 2017-11-01 DIAGNOSIS — G20 Parkinson's disease: Secondary | ICD-10-CM | POA: Diagnosis not present

## 2017-11-01 DIAGNOSIS — F419 Anxiety disorder, unspecified: Secondary | ICD-10-CM | POA: Insufficient documentation

## 2017-11-01 DIAGNOSIS — E785 Hyperlipidemia, unspecified: Secondary | ICD-10-CM | POA: Diagnosis not present

## 2017-11-01 DIAGNOSIS — R1310 Dysphagia, unspecified: Secondary | ICD-10-CM | POA: Diagnosis present

## 2017-11-01 DIAGNOSIS — I2510 Atherosclerotic heart disease of native coronary artery without angina pectoris: Secondary | ICD-10-CM | POA: Insufficient documentation

## 2017-11-01 DIAGNOSIS — Z955 Presence of coronary angioplasty implant and graft: Secondary | ICD-10-CM | POA: Insufficient documentation

## 2017-11-01 DIAGNOSIS — R7303 Prediabetes: Secondary | ICD-10-CM | POA: Diagnosis not present

## 2017-11-01 DIAGNOSIS — Z882 Allergy status to sulfonamides status: Secondary | ICD-10-CM | POA: Diagnosis not present

## 2017-11-01 DIAGNOSIS — Z951 Presence of aortocoronary bypass graft: Secondary | ICD-10-CM | POA: Insufficient documentation

## 2017-11-01 DIAGNOSIS — Z91041 Radiographic dye allergy status: Secondary | ICD-10-CM | POA: Diagnosis not present

## 2017-11-01 DIAGNOSIS — K222 Esophageal obstruction: Secondary | ICD-10-CM | POA: Diagnosis not present

## 2017-11-01 DIAGNOSIS — I10 Essential (primary) hypertension: Secondary | ICD-10-CM | POA: Insufficient documentation

## 2017-11-01 DIAGNOSIS — Z7982 Long term (current) use of aspirin: Secondary | ICD-10-CM | POA: Insufficient documentation

## 2017-11-01 DIAGNOSIS — Z79899 Other long term (current) drug therapy: Secondary | ICD-10-CM | POA: Insufficient documentation

## 2017-11-01 HISTORY — PX: BALLOON DILATION: SHX5330

## 2017-11-01 HISTORY — PX: ESOPHAGOGASTRODUODENOSCOPY (EGD) WITH PROPOFOL: SHX5813

## 2017-11-01 LAB — GLUCOSE, CAPILLARY: Glucose-Capillary: 128 mg/dL — ABNORMAL HIGH (ref 65–99)

## 2017-11-01 SURGERY — ESOPHAGOGASTRODUODENOSCOPY (EGD) WITH PROPOFOL
Anesthesia: General

## 2017-11-01 MED ORDER — PROPOFOL 500 MG/50ML IV EMUL
INTRAVENOUS | Status: DC | PRN
  Administered 2017-11-01: 150 ug/kg/min via INTRAVENOUS

## 2017-11-01 MED ORDER — SODIUM CHLORIDE 0.9 % IV SOLN
INTRAVENOUS | Status: DC
  Administered 2017-11-01: 09:00:00 via INTRAVENOUS

## 2017-11-01 MED ORDER — SODIUM CHLORIDE 0.9 % IV SOLN
INTRAVENOUS | Status: DC | PRN
  Administered 2017-11-01: 10:00:00 via INTRAVENOUS

## 2017-11-01 MED ORDER — SODIUM CHLORIDE 0.9 % IV SOLN
INTRAVENOUS | Status: DC
Start: 2017-11-01 — End: 2017-11-01

## 2017-11-01 MED ORDER — PROPOFOL 500 MG/50ML IV EMUL
INTRAVENOUS | Status: AC
  Filled 2017-11-01: qty 50

## 2017-11-01 MED ORDER — LIDOCAINE HCL (PF) 2 % IJ SOLN
INTRAMUSCULAR | Status: DC | PRN
  Administered 2017-11-01: 50 mg via INTRADERMAL

## 2017-11-01 MED ORDER — PROPOFOL 10 MG/ML IV BOLUS
INTRAVENOUS | Status: DC | PRN
  Administered 2017-11-01: 70 mg via INTRAVENOUS

## 2017-11-01 NOTE — Anesthesia Post-op Follow-up Note (Signed)
Anesthesia QCDR form completed.        

## 2017-11-01 NOTE — H&P (Signed)
Primary Care Physician:  Duanne Limerick, MD Primary Gastroenterologist:  Dr. Mechele Collin  Pre-Procedure History & Physical: HPI:  Sean Barajas is a 61 y.o. male is here for an endoscopy.   Past Medical History:  Diagnosis Date  . Anxiety   . Coronary artery disease   . Hyperlipidemia   . Hypertension   . Parkinson's disease (HCC)   . Prediabetes     Past Surgical History:  Procedure Laterality Date  . APPENDECTOMY    . CARDIAC CATHETERIZATION    . CAROTID STENT  07/2015  . CHOLECYSTECTOMY    . CORONARY ANGIOPLASTY    . CORONARY ARTERY BYPASS GRAFT    . CORONARY STENT INTERVENTION N/A 01/30/2017   Procedure: Coronary Stent Intervention;  Surgeon: Marcina Millard, MD;  Location: [facility];  Service: Cardiovascular;  Laterality: N/A;  . CORONARY STENT INTERVENTION N/A 06/13/2017   Procedure: Coronary Stent Intervention;  Surgeon: Iran Ouch, MD;  Location: [facility];  Service: Cardiovascular;  Laterality: N/A;  . EYE SURGERY Bilateral   . LEFT HEART CATH AND CORS/GRAFTS ANGIOGRAPHY N/A 01/30/2017   Procedure: Left Heart Cath and Cors/Grafts Angiography and possible PCI;  Surgeon: Marcina Millard, MD;  Location: [facility];  Service: Cardiovascular;  Laterality: N/A;  . LEFT HEART CATH AND CORS/GRAFTS ANGIOGRAPHY N/A 06/13/2017   Procedure: Left Heart Cath and Cors/Grafts Angiography;  Surgeon: Lamar Blinks, MD;  Location: [facility];  Service: Cardiovascular;  Laterality: N/A;  . NASAL SEPTUM SURGERY    . TONSILLECTOMY      Prior to Admission medications   Medication Sig Start Date End Date Taking? Authorizing Provider  ALPRAZolam Prudy Feeler) 0.5 MG tablet Take 0.5 mg by mouth 3 (three) times daily. Dr Malvin Johns   Yes [provider]  aspirin EC 81 MG tablet Take 81 mg by mouth daily.   Yes [provider]  carbidopa-levodopa (SINEMET CR) 50-200 MG tablet Take 1 tablet by mouth at bedtime. 12/28/16  Yes  Tat, Octaviano Batty, DO  carbidopa-levodopa (SINEMET IR) 25-100 MG tablet 3 in the morning/2 midmorning/3afternoon/2 midafternoon/2 evening Patient taking differently: Take 3 tablets by mouth 4 (four) times daily.  09/02/17  Yes Tat, Octaviano Batty, DO  carvedilol (COREG) 6.25 MG tablet Take 6.25 mg by mouth 2 (two) times daily.    Yes [provider]  empagliflozin (JARDIANCE) 10 MG TABS tablet Take 10 mg by mouth daily. 06/27/17  Yes Romero Belling, MD  ibuprofen (ADVIL,MOTRIN) 800 MG tablet Take 800 mg by mouth every 8 (eight) hours as needed.   Yes [provider]  isosorbide mononitrate (IMDUR) 30 MG 24 hr tablet Take 1 tablet (30 mg total) by mouth daily. 01/31/17  Yes Delfino Lovett, MD  lisinopril (ZESTRIL) 2.5 MG tablet Take 1 tablet (2.5 mg total) by mouth daily. 01/31/17  Yes Delfino Lovett, MD  metFORMIN (GLUCOPHAGE-XR) 500 MG 24 hr tablet Take 1 tablet (500 mg total) by mouth daily with breakfast. 09/25/17  Yes Romero Belling, MD  pantoprazole (PROTONIX) 40 MG tablet Take 40 mg by mouth 2 (two) times daily.   Yes [provider]  rosuvastatin (CRESTOR) 20 MG tablet Take 20 mg by mouth at bedtime.   Yes [provider]  acetaminophen (TYLENOL) 500 MG tablet Take 500 mg by mouth every 6 (six) hours as needed for mild pain.     [provider]  albuterol (PROVENTIL HFA;VENTOLIN HFA) 108 (90 BASE) MCG/ACT inhaler Inhale 2 puffs  into the lungs every 6 (six) hours as needed for wheezing or shortness of breath.    [provider]  APOKYN 30 MG/3ML SOCT INJECT 0.2ML SUBCUTANEOUSLY AS STARTER DOSE AND TITRATE TO EFFECTIVE DOSE (MAX OF 0.6ML) BASED ON MANUFACTURER GUIDELINES (UP TO 3 TIMES PER 10/15/17   Tat, Rebecca S, DO  ARTIFICIAL TEAR OP Apply 1 drop to eye as needed (for dry eyes).    [provider]  cephALEXin (KEFLEX) 500 MG capsule Take 1 capsule (500 mg total) by mouth 4 (four) times daily. 09/30/17   Duanne LimerickJones, Deanna C, MD  chlorhexidine (HIBICLENS)  4 % external liquid Apply topically daily as needed. 09/30/17   Duanne LimerickJones, Deanna C, MD  fluticasone (FLONASE) 50 MCG/ACT nasal spray Place 2 sprays into both nostrils daily. 09/10/17   Duanne LimerickJones, Deanna C, MD  glucose blood test strip 1 each by Other route daily. And lancets 1/day 06/25/17   Romero BellingEllison, Sean, MD  mupirocin ointment (BACTROBAN) 2 % Apply 1 application topically 2 (two) times daily. 09/30/17   Duanne LimerickJones, Deanna C, MD  neomycin-polymyxin-hydrocortisone (CORTISPORIN) 3.5-10000-1 OTIC suspension Place 3 drops into both ears 4 (four) times daily. 06/17/17   Duanne LimerickJones, Deanna C, MD  nitroGLYCERIN (NITROSTAT) 0.4 MG SL tablet Place 0.4 mg under the tongue every 5 (five) minutes as needed for chest pain.    [provider]  phenol (CHLORASEPTIC) 1.4 % LIQD Use as directed 1 spray in the mouth or throat as needed for throat irritation / pain.    [provider]  promethazine (PHENERGAN) 25 MG tablet Take 25 mg by mouth every 8 (eight) hours as needed for nausea or vomiting.    [provider]  ticagrelor (BRILINTA) 90 MG TABS tablet Take 90 mg by mouth every 12 (twelve) hours.    [provider]  trimethobenzamide (TIGAN) 300 MG capsule Take 1 capsule (300 mg total) 3 (three) times daily by mouth. Patient not taking: Reported on 11/01/2017 10/16/17   Tat, Octaviano Battyebecca S, DO  valACYclovir (VALTREX) 500 MG tablet Take 500 mg by mouth 2 (two) times daily.    [provider]    Allergies as of 10/28/2017 - Review Complete 09/30/2017  Allergen Reaction Noted  . Iodinated diagnostic agents Nausea Only and Other (See Comments) 07/25/2015  . Sulfa antibiotics Rash 07/25/2015    Family History  Problem Relation Age of Onset  . Breast cancer Mother   . Diabetes Father   . CAD Father   . CVA Father   . Diabetes Brother     Social History   Socioeconomic History  . Marital status: Single    Spouse name: Not on file  . Number of children: Not on file  . Years of  education: Not on file  . Highest education level: Not on file  Social Needs  . Financial resource strain: Not on file  . Food insecurity - worry: Not on file  . Food insecurity - inability: Not on file  . Transportation needs - medical: Not on file  . Transportation needs - non-medical: Not on file  Occupational History  . Occupation: self employed    Comment: Psychiatristtoe truck driver  Tobacco Use  . Smoking status: Never Smoker  . Smokeless tobacco: Never Used  Substance and Sexual Activity  . Alcohol use: No    Alcohol/week: 0.0 oz    Comment: 1 glass wine/month  . Drug use: No    Comment: hemp oil  . Sexual activity: Not on file  Other Topics Concern  . Not on file  Social History Narrative  . Not on file    Review of Systems: See HPI, otherwise negative ROS  Physical Exam: BP (!) 144/86   Pulse 60   Temp 97.6 F (36.4 C) (Tympanic)   Resp 14   Ht 5\' 11"  (1.803 m)   Wt 93.4 kg (206 lb)   SpO2 99%   BMI 28.73 kg/m  General:   Alert,  pleasant and cooperative in NAD Head:  Normocephalic and atraumatic. Neck:  Supple; no masses or thyromegaly. Lungs:  Clear throughout to auscultation.    Heart:  Regular rate and rhythm. Abdomen:  Soft, nontender and nondistended. Normal bowel sounds, without guarding, and without rebound.   Neurologic:  Alert and  oriented x4;  grossly normal neurologically.  Impression/Plan: Sean Barajas is here for an endoscopy to be performed for dysphagia.  Risks, benefits, limitations, and alternatives regarding  endoscopy have been reviewed with the patient.  Questions have been answered.  All parties agreeable.   Lynnae PrudeELLIOTT, Abdulhamid, MD  11/01/2017, 9:45 AM

## 2017-11-01 NOTE — Op Note (Signed)
[facility] Gastroenterology Patient Name: Sean McalpineRobert Barajas Procedure Date: 11/01/2017 9:45 AM MRN: [id number] Account #: [id number] Date of Birth: 03/30/1956 Admit Type: Outpatient Age: [age] [facility] Gender: Male Note Status: Finalized Procedure:            Upper GI endoscopy Indications:          Dysphagia Providers:            Scot Junobert T. Elliott, MD Referring MD:         Duanne Limerickeanna C. Jones, MD (Referring MD) Medicines:            Propofol per Anesthesia Complications:        No immediate complications. Procedure:            Pre-Anesthesia Assessment:                       - After reviewing the risks and benefits, the patient                        was deemed in satisfactory condition to undergo the                        procedure.                       After obtaining informed consent, the endoscope was                        passed under direct vision. Throughout the procedure,                        the patient's blood pressure, pulse, and oxygen                        saturations were monitored continuously. The                        Colonoscope was introduced through the mouth, and                        advanced to the second part of duodenum. The upper GI                        endoscopy was accomplished without difficulty. The                        patient tolerated the procedure well. Findings:      A moderate-sized area of extrinsic compression was found in the upper       third of the esophagus. This was due to cardiac structures pressing on       the proximal esophagus There appeared to be some whitish spots on       esophagus most consistent with candida (yeast) No polyps, tumors, ulcers       seen in esophagus. After complete exam I passed a 51 Savary dilator over       a guide wire without any significant resistance.      The stomach was normal.      The examined duodenum was normal. Impression:           - Extrinsic compression in  the upper third of the  esophagus.                       - Normal stomach.                       - Normal examined duodenum.                       - No specimens collected. Recommendation:       - The findings and recommendations were discussed with                        the patient's family. Scot Junobert T Elliott, MD 11/01/2017 10:06:10 AM This report has been signed electronically. Number of Addenda: 0 Note Initiated On: 11/01/2017 9:45 AM      [facility]

## 2017-11-01 NOTE — Anesthesia Postprocedure Evaluation (Signed)
Anesthesia Post Note  Patient: Sean EdisonRobert M Barajas  Procedure(s) Performed: ESOPHAGOGASTRODUODENOSCOPY (EGD) WITH PROPOFOL (N/A ) BALLOON DILATION (N/A )  Patient location during evaluation: Endoscopy Anesthesia Type: General Level of consciousness: awake and alert and oriented Pain management: pain level controlled Vital Signs Assessment: post-procedure vital signs reviewed and stable Respiratory status: spontaneous breathing, nonlabored ventilation and respiratory function stable Cardiovascular status: blood pressure returned to baseline and stable Postop Assessment: no signs of nausea or vomiting Anesthetic complications: no     Last Vitals:  Vitals:   11/01/17 1023 11/01/17 1033  BP: 114/76 118/73  Pulse: (!) 58 (!) 54  Resp: 17 18  Temp:    SpO2: 95% 97%    Last Pain:  Vitals:   11/01/17 1004  TempSrc: Tympanic                 Letina Luckett

## 2017-11-01 NOTE — Transfer of Care (Signed)
Immediate Anesthesia Transfer of Care Note  Patient: Sean Barajas  Procedure(s) Performed: ESOPHAGOGASTRODUODENOSCOPY (EGD) WITH PROPOFOL (N/A ) BALLOON DILATION (N/A )  Patient Location: Endoscopy Unit  Anesthesia Type:General  Level of Consciousness: sedated  Airway & Oxygen Therapy: Patient Spontanous Breathing and Patient connected to nasal cannula oxygen  Post-op Assessment: Report given to RN and Post -op Vital signs reviewed and stable  Post vital signs: Reviewed and stable  Last Vitals:  Vitals:   11/01/17 0859  BP: (!) 144/86  Pulse: 60  Resp: 14  Temp: 36.4 C  SpO2: 99%    Last Pain:  Vitals:   11/01/17 0859  TempSrc: Tympanic         Complications: No apparent anesthesia complications

## 2017-11-01 NOTE — Anesthesia Preprocedure Evaluation (Signed)
Anesthesia Evaluation  Patient identified by MRN, date of birth, ID band Patient awake    Reviewed: Allergy & Precautions, NPO status , Patient's Chart, lab work & pertinent test results  History of Anesthesia Complications Negative for: history of anesthetic complications  Airway Mallampati: III  TM Distance: >3 FB Neck ROM: Full    Dental no notable dental hx.    Pulmonary neg pulmonary ROS, neg sleep apnea, neg COPD,    breath sounds clear to auscultation- rhonchi (-) wheezing      Cardiovascular hypertension, Pt. on medications (-) angina+ CAD, + Past MI, + Cardiac Stents (last stent 06/2017) and + CABG (2008)   Rhythm:Regular Rate:Normal - Systolic murmurs and - Diastolic murmurs L heart cath 14/78/2906/11/19:  Prox Cx lesion, 0 %stenosed.  A drug eluting .  Ost 2nd Mrg to 2nd Mrg lesion, 0 %stenosed.  A drug eluting .  Prox RCA lesion, 100 %stenosed.  SVG.  Prox Graft lesion, 60 %stenosed.  SVG.  Origin lesion, 100 %stenosed.  LIMA.  Prox LAD-2 lesion, 100 %stenosed.  Prox LAD-1 lesion, 99 %stenosed.  Dist LAD lesion, 100 %stenosed.  Dist Graft-2 lesion, 99 %stenosed.  Dist Graft-1 lesion, 0 %stenosed.  Dist RCA lesion, 70 %stenosed. PCI and stent placement of distal right coronary artery graft stenosis   Neuro/Psych Anxiety Parkinson's disease     GI/Hepatic negative GI ROS, Neg liver ROS,   Endo/Other  diabetes, Oral Hypoglycemic Agents  Renal/GU negative Renal ROS     Musculoskeletal negative musculoskeletal ROS (+)   Abdominal (+) - obese,   Peds  Hematology negative hematology ROS (+)   Anesthesia Other Findings Past Medical History: No date: Anxiety No date: Coronary artery disease No date: Hyperlipidemia No date: Hypertension No date: Parkinson's disease (HCC) No date: Prediabetes   Reproductive/Obstetrics                             Anesthesia  Physical Anesthesia Plan  ASA: III  Anesthesia Plan: General   Post-op Pain Management:    Induction: Intravenous  PONV Risk Score and Plan: 1 and Propofol infusion  Airway Management Planned: Natural Airway  Additional Equipment:   Intra-op Plan:   Post-operative Plan:   Informed Consent: I have reviewed the patients History and Physical, chart, labs and discussed the procedure including the risks, benefits and alternatives for the proposed anesthesia with the patient or authorized representative who has indicated his/her understanding and acceptance.   Dental advisory given  Plan Discussed with: CRNA and Anesthesiologist  Anesthesia Plan Comments:         Anesthesia Quick Evaluation

## 2017-11-04 ENCOUNTER — Encounter: Payer: Self-pay | Admitting: Unknown Physician Specialty

## 2017-11-04 ENCOUNTER — Telehealth: Payer: Self-pay

## 2017-11-04 ENCOUNTER — Ambulatory Visit (INDEPENDENT_AMBULATORY_CARE_PROVIDER_SITE_OTHER): Payer: BLUE CROSS/BLUE SHIELD | Admitting: Neurology

## 2017-11-04 ENCOUNTER — Other Ambulatory Visit: Payer: Self-pay

## 2017-11-04 DIAGNOSIS — G20 Parkinson's disease: Secondary | ICD-10-CM | POA: Diagnosis not present

## 2017-11-04 DIAGNOSIS — R1313 Dysphagia, pharyngeal phase: Secondary | ICD-10-CM

## 2017-11-04 NOTE — Telephone Encounter (Signed)
Pt is having swallowing difficulties. He has had an upper endoscopy with Dr Mechele CollinElliott who, I am under the impression, told him that his heart is pressing against his esophagus. This is resulting in the difficulty swallowing. Pt said he has been told there is nothing else GI can do. He had contacted Dr Darrold JunkerParaschos, who also told him nothing else can be done. Pt is wanting to know where to go from here. I called GI and got his appt moved up to Dec 27th @ 1:30 with Harmon DunMichelle Johnson in [place]. Also, contacted Jenna in [place] to see if there were any suggestions from GI and cardiology as to what happens from here. If they cannot help him, do they know who can? Awaiting Jenna's call, [facility]
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
## 2017-11-26 ENCOUNTER — Emergency Department: Payer: BLUE CROSS/BLUE SHIELD

## 2017-11-26 ENCOUNTER — Emergency Department
Admission: EM | Admit: 2017-11-26 | Discharge: 2017-11-27 | Disposition: A | Discharge status: Home / Self Care | Payer: BLUE CROSS/BLUE SHIELD | Attending: Emergency Medicine | Admitting: Emergency Medicine

## 2017-11-26 ENCOUNTER — Other Ambulatory Visit: Payer: Self-pay

## 2017-11-26 DIAGNOSIS — R51 Headache: Secondary | ICD-10-CM | POA: Insufficient documentation

## 2017-11-26 DIAGNOSIS — R519 Headache, unspecified: Secondary | ICD-10-CM

## 2017-11-26 DIAGNOSIS — R079 Chest pain, unspecified: Secondary | ICD-10-CM

## 2017-11-26 DIAGNOSIS — G20 Parkinson's disease: Secondary | ICD-10-CM | POA: Insufficient documentation

## 2017-11-26 DIAGNOSIS — I10 Essential (primary) hypertension: Secondary | ICD-10-CM | POA: Insufficient documentation

## 2017-11-26 DIAGNOSIS — R0602 Shortness of breath: Secondary | ICD-10-CM | POA: Insufficient documentation

## 2017-11-26 DIAGNOSIS — I2510 Atherosclerotic heart disease of native coronary artery without angina pectoris: Secondary | ICD-10-CM | POA: Diagnosis not present

## 2017-11-26 DIAGNOSIS — J0130 Acute sphenoidal sinusitis, unspecified: Secondary | ICD-10-CM | POA: Diagnosis not present

## 2017-11-26 DIAGNOSIS — Z79899 Other long term (current) drug therapy: Secondary | ICD-10-CM | POA: Insufficient documentation

## 2017-11-26 DIAGNOSIS — Z951 Presence of aortocoronary bypass graft: Secondary | ICD-10-CM | POA: Insufficient documentation

## 2017-11-26 DIAGNOSIS — Z7982 Long term (current) use of aspirin: Secondary | ICD-10-CM | POA: Insufficient documentation

## 2017-11-26 DIAGNOSIS — Z7984 Long term (current) use of oral hypoglycemic drugs: Secondary | ICD-10-CM | POA: Insufficient documentation

## 2017-11-26 LAB — BASIC METABOLIC PANEL
Anion gap: 7 (ref 5–15)
BUN: 15 mg/dL (ref 6–20)
CALCIUM: 9.2 mg/dL (ref 8.9–10.3)
CO2: 29 mmol/L (ref 22–32)
CREATININE: 0.9 mg/dL (ref 0.61–1.24)
Chloride: 101 mmol/L (ref 101–111)
GFR calc non Af Amer: >60 mL/min (ref >60)
Glucose, Bld: 175 mg/dL — ABNORMAL HIGH (ref 65–99)
Potassium: 3.9 mmol/L (ref 3.5–5.1)
SODIUM: 137 mmol/L (ref 135–145)

## 2017-11-26 LAB — CBC
HCT: 44.5 % (ref 40.0–52.0)
Hemoglobin: 14.9 g/dL (ref 13.0–18.0)
MCH: 31.2 pg (ref 26.0–34.0)
MCHC: 33.5 g/dL (ref 32.0–36.0)
MCV: 93.2 fL (ref 80.0–100.0)
PLATELETS: 137 10*3/uL — AB (ref 150–440)
RBC: 4.77 MIL/uL (ref 4.40–5.90)
RDW: 14.5 % (ref 11.5–14.5)
WBC: 6.2 10*3/uL (ref 3.8–10.6)

## 2017-11-26 LAB — TROPONIN I

## 2017-11-26 MED ORDER — LIDOCAINE VISCOUS 2 % MT SOLN
15.0000 mL | Freq: Once | OROMUCOSAL | Status: AC
  Administered 2017-11-26: 15 mL via OROMUCOSAL
  Filled 2017-11-26: qty 15

## 2017-11-26 MED ORDER — PROCHLORPERAZINE EDISYLATE 5 MG/ML IJ SOLN
10.0000 mg | Freq: Once | INTRAMUSCULAR | Status: AC
  Administered 2017-11-26: 10 mg via INTRAVENOUS

## 2017-11-26 MED ORDER — PROCHLORPERAZINE EDISYLATE 5 MG/ML IJ SOLN
INTRAMUSCULAR | Status: AC
  Filled 2017-11-26: qty 2

## 2017-11-26 MED ORDER — DIPHENHYDRAMINE HCL 50 MG/ML IJ SOLN
12.5000 mg | Freq: Once | INTRAMUSCULAR | Status: AC
  Administered 2017-11-26: 12.5 mg via INTRAVENOUS

## 2017-11-26 MED ORDER — DIPHENHYDRAMINE HCL 50 MG/ML IJ SOLN
INTRAMUSCULAR | Status: AC
  Filled 2017-11-26: qty 1

## 2017-11-26 NOTE — ED Notes (Signed)
Pt taken to CT via stretcher. Visitor at bedside.

## 2017-11-26 NOTE — ED Notes (Signed)
Pt c/o facial pressure and nasal drainage. States feels like throat is swollen. Pt remains talking quietly, full sentences. No resp distress noted. Pt uncomfortable laying down.

## 2017-11-26 NOTE — ED Triage Notes (Signed)
Pt arrives via [facility] from home for SOB. Pt states he has felt SOB x 1 month since endoscopy where they found extrinisic compression of upper third of esophagus. States SOB worse today. Has not used inhaler today. Co of severe HA at bilat temporals. Denies any vision changes. Takes blood thinner. Talking quietly but in complete sentences. 93% RA with EMS.

## 2017-11-26 NOTE — ED Provider Notes (Signed)
[facility] Emergency Department Provider Note    First MD Initiated Contact with Patient 11/26/17 2235     (approximate)  I have reviewed the triage vital signs and the nursing notes.   HISTORY  Chief Complaint Shortness of Breath and Headache    HPI Sean Barajas is a 61 y.o. male history of Parkinson's as well as anxiety CAD and a history of esophageal strictures secondary to extrinsic compression presents for chief complaint of headache as well as difficulty swallowing and throat pain that started late this afternoon after the patient bent over.  States he is also having nasal congestion but he cannot get rid of.  States he has had similar headaches in the past but none this severe.  Is not on any blood thinners.  Patient had endoscopy performed fairly recently showing esophageal stricture with some component of candidiasis.  States the pain is mild to moderate in severity.  No fevers at home.  No abdominal pain or vomiting.  Past Medical History:  Diagnosis Date  . Anxiety   . Coronary artery disease   . Hyperlipidemia   . Hypertension   . Parkinson's disease (HCC)   . Prediabetes    Family History  Problem Relation Age of Onset  . Breast cancer Mother   . Diabetes Father   . CAD Father   . CVA Father   . Diabetes Brother    Past Surgical History:  Procedure Laterality Date  . APPENDECTOMY    . BALLOON DILATION N/A 11/01/2017   Procedure: BALLOON DILATION;  Surgeon: Scot Jun, MD;  Location: [facility] ENDOSCOPY;  Service: Endoscopy;  Laterality: N/A;  . CARDIAC CATHETERIZATION    . CAROTID STENT  07/2015  . CHOLECYSTECTOMY    . CORONARY ANGIOPLASTY    . CORONARY ARTERY BYPASS GRAFT    . CORONARY STENT INTERVENTION N/A 01/30/2017   Procedure: Coronary Stent Intervention;  Surgeon: Marcina Millard, MD;  Location: [facility] INVASIVE CV LAB;  Service: Cardiovascular;  Laterality: N/A;  . CORONARY STENT INTERVENTION N/A 06/13/2017   Procedure: Coronary Stent Intervention;  Surgeon: Iran Ouch, MD;  Location: [facility] INVASIVE CV LAB;  Service: Cardiovascular;  Laterality: N/A;  . ESOPHAGOGASTRODUODENOSCOPY (EGD) WITH PROPOFOL N/A 11/01/2017   Procedure: ESOPHAGOGASTRODUODENOSCOPY (EGD) WITH PROPOFOL;  Surgeon: Scot Jun, MD;  Location: [facility] ENDOSCOPY;  Service: Endoscopy;  Laterality: N/A;  . EYE SURGERY Bilateral   . LEFT HEART CATH AND CORS/GRAFTS ANGIOGRAPHY N/A 01/30/2017   Procedure: Left Heart Cath and Cors/Grafts Angiography and possible PCI;  Surgeon: Marcina Millard, MD;  Location: [facility] INVASIVE CV LAB;  Service: Cardiovascular;  Laterality: N/A;  . LEFT HEART CATH AND CORS/GRAFTS ANGIOGRAPHY N/A 06/13/2017   Procedure: Left Heart Cath and Cors/Grafts Angiography;  Surgeon: Lamar Blinks, MD;  Location: [facility] INVASIVE CV LAB;  Service: Cardiovascular;  Laterality: N/A;  . NASAL SEPTUM SURGERY    . TONSILLECTOMY     Patient Active Problem List   Diagnosis Date Noted  . Dysphagia   . Esophageal stricture   . Chest pain 07/31/2017  . Diabetes (HCC) 06/26/2017  . CAD (coronary artery disease) 01/30/2017  . Unstable angina (HCC) 01/29/2017  . Aortic atherosclerosis (HCC) 08/28/2016      Prior to Admission medications   Medication Sig Start Date End Date Taking? Authorizing Provider  acetaminophen (TYLENOL) 500 MG tablet Take 500 mg by mouth every 6 (six) hours as needed for mild pain.     [provider]  albuterol (  PROVENTIL HFA;VENTOLIN HFA) 108 (90 BASE) MCG/ACT inhaler Inhale 2 puffs into the lungs every 6 (six) hours as needed for wheezing or shortness of breath.    [provider]  ALPRAZolam Prudy Feeler(XANAX) 0.5 MG tablet Take 0.5 mg by mouth 3 (three) times daily. Dr Malvin JohnsPotter    [provider]  APOKYN 30 MG/3ML SOCT INJECT 0.2ML SUBCUTANEOUSLY AS STARTER DOSE AND TITRATE TO EFFECTIVE DOSE (MAX OF 0.6ML) BASED ON MANUFACTURER GUIDELINES (UP TO 3 TIMES PER 10/15/17   Tat,  Rebecca S, DO  ARTIFICIAL TEAR OP Apply 1 drop to eye as needed (for dry eyes).    [provider]  aspirin EC 81 MG tablet Take 81 mg by mouth daily.    [provider]  carbidopa-levodopa (SINEMET CR) 50-200 MG tablet Take 1 tablet by mouth at bedtime. 12/28/16   Tat, Octaviano Battyebecca S, DO  carbidopa-levodopa (SINEMET IR) 25-100 MG tablet 3 in the morning/2 midmorning/3afternoon/2 midafternoon/2 evening Patient taking differently: Take 3 tablets by mouth 4 (four) times daily.  09/02/17   Tat, Octaviano Battyebecca S, DO  carvedilol (COREG) 6.25 MG tablet Take 6.25 mg by mouth 2 (two) times daily.     [provider]  cephALEXin (KEFLEX) 500 MG capsule Take 1 capsule (500 mg total) by mouth 4 (four) times daily. 09/30/17   Duanne LimerickJones, Deanna C, MD  chlorhexidine (HIBICLENS) 4 % external liquid Apply topically daily as needed. 09/30/17   Duanne LimerickJones, Deanna C, MD  empagliflozin (JARDIANCE) 10 MG TABS tablet Take 10 mg by mouth daily. 06/27/17   Romero BellingEllison, Sean, MD  fluticasone (FLONASE) 50 MCG/ACT nasal spray Place 2 sprays into both nostrils daily. 09/10/17   Duanne LimerickJones, Deanna C, MD  glucose blood test strip 1 each by Other route daily. And lancets 1/day 06/25/17   Romero BellingEllison, Sean, MD  ibuprofen (ADVIL,MOTRIN) 800 MG tablet Take 800 mg by mouth every 8 (eight) hours as needed.    [provider]  isosorbide mononitrate (IMDUR) 30 MG 24 hr tablet Take 1 tablet (30 mg total) by mouth daily. 01/31/17   Delfino LovettShah, Vipul, MD  lisinopril (ZESTRIL) 2.5 MG tablet Take 1 tablet (2.5 mg total) by mouth daily. 01/31/17   Delfino LovettShah, Vipul, MD  metFORMIN (GLUCOPHAGE-XR) 500 MG 24 hr tablet Take 1 tablet (500 mg total) by mouth daily with breakfast. 09/25/17   Romero BellingEllison, Sean, MD  mupirocin ointment (BACTROBAN) 2 % Apply 1 application topically 2 (two) times daily. 09/30/17   Duanne LimerickJones, Deanna C, MD  neomycin-polymyxin-hydrocortisone (CORTISPORIN) 3.5-10000-1 OTIC suspension Place 3 drops into both ears 4 (four) times daily. 06/17/17   Duanne LimerickJones,  Deanna C, MD  nitroGLYCERIN (NITROSTAT) 0.4 MG SL tablet Place 0.4 mg under the tongue every 5 (five) minutes as needed for chest pain.    [provider]  pantoprazole (PROTONIX) 40 MG tablet Take 40 mg by mouth 2 (two) times daily.    [provider]  phenol (CHLORASEPTIC) 1.4 % LIQD Use as directed 1 spray in the mouth or throat as needed for throat irritation / pain.    [provider]  promethazine (PHENERGAN) 25 MG tablet Take 25 mg by mouth every 8 (eight) hours as needed for nausea or vomiting.    [provider]  rosuvastatin (CRESTOR) 20 MG tablet Take 20 mg by mouth at bedtime.    [provider]  ticagrelor (BRILINTA) 90 MG TABS tablet Take 90 mg by mouth every 12 (twelve) hours.    [provider]  trimethobenzamide (TIGAN) 300 MG capsule  Take 1 capsule (300 mg total) 3 (three) times daily by mouth. Patient not taking: Reported on 11/01/2017 10/16/17   Tat, Octaviano Battyebecca S, DO  valACYclovir (VALTREX) 500 MG tablet Take 500 mg by mouth 2 (two) times daily.    [provider]    Allergies Iodinated diagnostic agents and Sulfa antibiotics    Social History Social History   Tobacco Use  . Smoking status: Never Smoker  . Smokeless tobacco: Never Used  Substance Use Topics  . Alcohol use: No    Alcohol/week: 0.0 oz    Comment: 1 glass wine/month  . Drug use: No    Comment: hemp oil    Review of Systems Patient denies headaches, rhinorrhea, blurry vision, numbness, shortness of breath, chest pain, edema, cough, abdominal pain, nausea, vomiting, diarrhea, dysuria, fevers, rashes or hallucinations unless otherwise stated above in HPI. ____________________________________________   PHYSICAL EXAM:  VITAL SIGNS: Vitals:   11/26/17 2218  BP: (!) 178/96  Pulse: 71  Resp: (!) 23  Temp: 98.5 F (36.9 C)  SpO2: 98%    Constitutional: Alert and oriented.  in no acute distress. Eyes: Conjunctivae are normal.  Head:  Atraumatic. Nose: + congestion/rhinnorhea. Mouth/Throat: Mucous membranes are moist.  No trismus, uvula midline Neck: No stridor. Painless ROM. No bruitis, masses or thyromegaly Cardiovascular: Normal rate, regular rhythm. Grossly normal heart sounds.  Good peripheral circulation. Respiratory: Normal respiratory effort.  No retractions. Lungs CTAB. Gastrointestinal: Soft and nontender. No distention. No abdominal bruits. No CVA tenderness. Genitourinary:  Musculoskeletal: No lower extremity tenderness nor edema.  No joint effusions. Neurologic:  Normal speech and language. No gross focal neurologic deficits are appreciated. No facial droop Skin:  Skin is warm, dry and intact. No rash noted. Psychiatric: Mood and affect are normal. Speech and behavior are normal.  ____________________________________________   LABS (all labs ordered are listed, but only abnormal results are displayed)  Results for orders placed or performed during the hospital encounter of 11/26/17 (from the past 24 hour(s))  Basic metabolic panel     Status: Abnormal   Collection Time: 11/26/17 10:19 PM  Result Value Ref Range   Sodium 137 135 - 145 mmol/L   Potassium 3.9 3.5 - 5.1 mmol/L   Chloride 101 101 - 111 mmol/L   CO2 29 22 - 32 mmol/L   Glucose, Bld 175 (H) 65 - 99 mg/dL   BUN 15 6 - 20 mg/dL   Creatinine, Ser 8.110.90 0.61 - 1.24 mg/dL   Calcium 9.2 8.9 - 91.410.3 mg/dL   GFR calc non Af Amer >60 >60 mL/min   GFR calc Af Amer >60 >60 mL/min   Anion gap 7 5 - 15  CBC     Status: Abnormal   Collection Time: 11/26/17 10:19 PM  Result Value Ref Range   WBC 6.2 3.8 - 10.6 K/uL   RBC 4.77 4.40 - 5.90 MIL/uL   Hemoglobin 14.9 13.0 - 18.0 g/dL   HCT 78.244.5 95.640.0 - 21.352.0 %   MCV 93.2 80.0 - 100.0 fL   MCH 31.2 26.0 - 34.0 pg   MCHC 33.5 32.0 - 36.0 g/dL   RDW 08.614.5 57.811.5 - 46.914.5 %   Platelets 137 (L) 150 - 440 K/uL  Troponin I     Status: None   Collection Time: 11/26/17 10:19 PM  Result Value Ref Range   Troponin I  <0.03 <0.03 ng/mL   ____________________________________________  EKG My review and personal interpretation at Time: 22:15   Indication: sob  Rate:  75  Rhythm: sinus Axis: normal Other: normal intervals, no stemi, nonspecific st changes unchanged from previous 8/30 ____________________________________________  RADIOLOGY  I personally reviewed all radiographic images ordered to evaluate for the above acute complaints and reviewed radiology reports and findings.  These findings were personally discussed with the patient.  Please see medical record for radiology report.  ____________________________________________   PROCEDURES  Procedure(s) performed:  Procedures    Critical Care performed: no ____________________________________________   INITIAL IMPRESSION / ASSESSMENT AND PLAN / ED COURSE  Pertinent labs & imaging results that were available during my care of the patient were reviewed by me and considered in my medical decision making (see chart for details).  DDX: migraine, sinusitis, mass, migraine, esophageal spasm, acs, pna, chf   SHOLOM DULUDE is a 61 y.o. who presents to the ED with chief complaints as above.  Patient in no acute distress but does appear uncomfortable.  No evidence of stridor or impending airway compromise.  Chest x-ray shows no evidence of pneumothorax consolidation or mass.  No evidence of candidiasis or esophageal irritation.  No evidence of PTA.  No trismus.  Does have bilateral nasal congestion and beefy-red turbinates.  Concerning for sinusitis.  Based on the sudden onset of his headache, previous history of extrinsic compression of his esophagus.  Will order CT imaging to evaluate for subarachnoid, mass.  Patient without any meningismus.  White count with no evidence of elevated troponin or leukocytosis.  No anemia.  Blood work is otherwise reassuring.  EKG shows no evidence of ischemic changes.  CT imaging shows no evidence of acute  abnormality.  This not clinically consistent with subarachnoid.  Does show evidence of sinusitis likely causing his upper respiratory symptoms with facial pressure.  Pain improvement with migraine cocktail.  Will start patient on Augmentin as well as give dose of Decadron for sinusitis.  Patient will be signed out to Dr. York Cerise pending repeat troponin.  Anticipate discharge home status post repeat troponin negative.  Clinical Course as of Nov 27 17  Wed Nov 27, 2017  0010 Assuming care from Dr. Roxan Hockey.  In short, Sean Barajas is a 61 y.o. male with multiple complaints.  Refer to the original H&P for additional details.  The current plan of care is to follow up on the second troponin and discharge if appropriate.   [CF]    Clinical Course User Index [CF] Loleta Rose, MD     ____________________________________________   FINAL CLINICAL IMPRESSION(S) / ED DIAGNOSES  Final diagnoses:  Acute nonintractable headache, unspecified headache type  Acute sphenoidal sinusitis, recurrence not specified  Chest pain, unspecified type      NEW MEDICATIONS STARTED DURING THIS VISIT:  This SmartLink is deprecated. Use AVSMEDLIST instead to display the medication list for a patient.   Note:  This document was prepared using Dragon voice recognition software and may include unintentional dictation errors.    Willy Eddy, MD 11/27/17 Aretha Parrot

## 2017-11-26 NOTE — ED Notes (Signed)
Pt returned from CT °

## 2017-11-26 NOTE — ED Notes (Signed)
Pt ambulatory to toilet with this Rn and back

## 2017-11-26 NOTE — ED Notes (Signed)
Pt taken to xray via stretcher  

## 2017-11-27 LAB — TROPONIN I: Troponin I: <0.03 ng/mL (ref <0.03)

## 2017-11-27 MED ORDER — AMOXICILLIN-POT CLAVULANATE 875-125 MG PO TABS: 1.0000 | ORAL_TABLET | Freq: Two times a day (BID) | ORAL | 0 refills | Status: AC

## 2017-11-27 MED ORDER — OXYMETAZOLINE HCL 0.05 % NA SOLN
1.0000 | Freq: Once | NASAL | Status: AC
  Administered 2017-11-27: 1 via NASAL
  Filled 2017-11-27: qty 15

## 2017-11-27 MED ORDER — AMOXICILLIN-POT CLAVULANATE 875-125 MG PO TABS
1.0000 | ORAL_TABLET | Freq: Once | ORAL | Status: AC
  Administered 2017-11-27: 1 via ORAL
  Filled 2017-11-27: qty 1

## 2017-11-27 MED ORDER — ACETAMINOPHEN 500 MG PO TABS
1000.0000 mg | ORAL_TABLET | Freq: Once | ORAL | Status: AC
  Administered 2017-11-27: 1000 mg via ORAL
  Filled 2017-11-27: qty 2

## 2017-11-27 MED ORDER — DEXAMETHASONE SODIUM PHOSPHATE 10 MG/ML IJ SOLN
10.0000 mg | Freq: Once | INTRAMUSCULAR | Status: AC
  Administered 2017-11-27: 10 mg via INTRAVENOUS
  Filled 2017-11-27: qty 1

## 2017-11-27 NOTE — ED Notes (Signed)
Pt talking to wife on the phone  

## 2017-11-27 NOTE — ED Provider Notes (Signed)
Dg Chest 2 View  Result Date: 11/26/2017 CLINICAL DATA:  61 y/o  M; shortness of breath and cough. EXAM: CHEST  2 VIEW COMPARISON:  07/31/2017 chest radiograph FINDINGS: Stable normal cardiac silhouette. Post median sternotomy with wires in alignment. Aortic atherosclerosis with calcification. Clear lungs. No pleural effusion or pneumothorax. Mild degenerative changes of thoracic spine. IMPRESSION: No acute pulmonary process identified.  Aortic atherosclerosis. Electronically Signed   By: Mitzi HansenLance  Furusawa-Stratton M.D.   On: 11/26/2017 22:51   Ct Head Wo Contrast  Result Date: 11/26/2017 CLINICAL DATA:  Chronic shortness of breath and acute onset of severe bitemporal headache. EXAM: CT HEAD WITHOUT CONTRAST TECHNIQUE: Contiguous axial images were obtained from the base of the skull through the vertex without intravenous contrast. COMPARISON:  CT of the head performed 09/27/2015 FINDINGS: Brain: No evidence of acute infarction, hemorrhage, hydrocephalus, extra-axial collection or mass lesion/mass effect. Prominence of the sulci reflects mild cortical volume loss. Mild cerebellar atrophy is noted. The brainstem and fourth ventricle are within normal limits. The basal ganglia are unremarkable in appearance. The cerebral hemispheres demonstrate grossly normal gray-white differentiation. No mass effect or midline shift is seen. Vascular: No hyperdense vessel or unexpected calcification. Skull: There is no evidence of fracture; visualized osseous structures are unremarkable in appearance. Sinuses/Orbits: The orbits are within normal limits. Mild mucosal thickening is noted at the sphenoid sinus. The remaining paranasal sinuses and mastoid air cells are well-aerated. Other: No significant soft tissue abnormalities are seen. IMPRESSION: 1. No acute intracranial pathology seen on CT. 2. Mild cortical volume loss. 3. Mild mucosal thickening at the sphenoid sinus. Electronically Signed   By: Roanna RaiderJeffery  Chang M.D.   On:  11/26/2017 23:32   Ct Soft Tissue Neck Wo Contrast  Result Date: 11/26/2017 CLINICAL DATA:  61 y/o M; 1 month of shortness of breath. Endoscopy with extrinsic compression of upper third of esophagus. Severe bitemporal headache. EXAM: CT NECK WITHOUT CONTRAST TECHNIQUE: Multidetector CT imaging of the neck was performed following the standard protocol without intravenous contrast. COMPARISON:  None. FINDINGS: Pharynx and larynx: Normal. No mass or swelling. Salivary glands: No inflammation, mass, or stone. Thyroid: Normal. Lymph nodes: None enlarged or abnormal density. Vascular: Mild calcific atherosclerosis of carotid bifurcations. Limited intracranial: Negative. Visualized orbits: Negative. Mastoids and visualized paranasal sinuses: Moderate diffuse paranasal sinus mucosal thickening and opacification of right mastoid tip. Otherwise negative. 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 Moderate cervical spondylosis greatest at the C5-C7 levels where there is loss of disc space height and marginal osteophytes. Healed upper median sternotomy. Upper chest: Negative. Other: None. IMPRESSION: 1. No mass effect on visible aerodigestive tract. No inflammatory process. 2. 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 3. Moderate cervical spine spondylosis. Electronically Signed   By: Mitzi HansenLance  Furusawa-Stratton M.D.   On: 11/26/2017 23:34   Ct Chest Wo Contrast  Result Date: 11/26/2017 CLINICAL DATA:  61 year old male with shortness of breath. EXAM: CT CHEST WITHOUT CONTRAST TECHNIQUE: Multidetector CT imaging of the chest was performed following the standard protocol without IV contrast. COMPARISON:  Chest radiograph dated 11/26/2017 FINDINGS: Evaluation of this exam is limited in the absence of intravenous contrast. Cardiovascular: Top-normal cardiac size. Multi vessel coronary vascular  calcification and CABG clips. The thoracic aorta and central pulmonary arteries are grossly unremarkable on this noncontrast CT. Mediastinum/Nodes: There is no hilar or mediastinal adenopathy. Esophagus and  the thyroid gland are grossly unremarkable. No mediastinal fluid collection. Lungs/Pleura: Left lung base linear atelectasis/ scarring. The right lung is clear. There is no pleural effusion or pneumothorax. The central airways are patent. Upper Abdomen: Cholecystectomy. The visualized upper abdomen is otherwise unremarkable. Musculoskeletal: Osteopenia with degenerative changes of the spine. Anterior spinal bridging osteophytes consistent with DISH. Median sternotomy wires. No acute osseous pathology. IMPRESSION: 1. No acute intrathoracic pathology. Left lung base linear atelectasis/scarring. 2. Coronary vascular calcification. Electronically Signed   By: Elgie CollardArash  Radparvar M.D.   On: 11/26/2017 23:36     Labs Reviewed  BASIC METABOLIC PANEL - Abnormal; Notable for the following components:      Result Value   Glucose, Bld 175 (*)    All other components within normal limits  CBC - Abnormal; Notable for the following components:   Platelets 137 (*)    All other components within normal limits  TROPONIN I  TROPONIN I     Clinical Course as of Nov 27 237  Wed Nov 27, 2017  0010 Assuming care from Dr. Roxan Hockeyobinson.  In short, Ulanda EdisonRobert M Rorrer is a 61 y.o. male with multiple complaints.  Refer to the original H&P for additional details.  The current plan of care is to follow up on the second troponin and discharge if appropriate.   [CF]  0234 second troponin negative, patient in no acute distress.  Discharging as per Dr. Danella Pentonobinson's plan  [CF]    Clinical Course User Index [CF] Loleta RoseForbach, Ken Bonn, MD     Final diagnoses:  Acute nonintractable headache, unspecified headache type  Acute sphenoidal sinusitis, recurrence not specified  Chest pain, unspecified type      Loleta RoseForbach, Loralee Weitzman,  MD 11/27/17 [phone]

## 2017-11-27 NOTE — Discharge Instructions (Signed)

## 2017-11-29 ENCOUNTER — Encounter: Payer: Self-pay | Admitting: Family Medicine

## 2017-11-29 ENCOUNTER — Ambulatory Visit (INDEPENDENT_AMBULATORY_CARE_PROVIDER_SITE_OTHER): Payer: BLUE CROSS/BLUE SHIELD | Admitting: Family Medicine

## 2017-11-29 DIAGNOSIS — J0110 Acute frontal sinusitis, unspecified: Secondary | ICD-10-CM | POA: Diagnosis not present

## 2017-11-29 MED ORDER — FLUTICASONE PROPIONATE 50 MCG/ACT NA SUSP: 2.0000 | Freq: Every day | NASAL | 6 refills | Status: DC

## 2017-11-29 NOTE — Patient Instructions (Signed)

## 2017-11-29 NOTE — Progress Notes (Signed)
Name: Sean Barajas   MRN: [id number]    DOB: 12-25-1955   Date:11/29/2017       Progress Note  Subjective  Chief Complaint  Chief Complaint  Patient presents with  . Sinusitis    on day 2 of Augmentin, feels like "my head is going to come off when I bend over"    Sinusitis  This is a new problem. The current episode started in the past 7 days (last friday). The problem is unchanged. There has been no fever. The pain is mild. Associated symptoms include congestion, coughing, ear pain, shortness of breath, sinus pressure and sneezing. Pertinent negatives include no chills, diaphoresis, headaches, hoarse voice, neck pain, sore throat or swollen glands. Past treatments include acetaminophen. The treatment provided moderate relief.    No problem-specific Assessment & Plan notes found for this encounter.   Past Medical History:  Diagnosis Date  . Anxiety   . Coronary artery disease   . Hyperlipidemia   . Hypertension   . Parkinson's disease (HCC)   . Prediabetes     Past Surgical History:  Procedure Laterality Date  . APPENDECTOMY    . BALLOON DILATION N/A 11/01/2017   Procedure: BALLOON DILATION;  Surgeon: Scot Jun, MD;  Location: [facility] ENDOSCOPY;  Service: Endoscopy;  Laterality: N/A;  . CARDIAC CATHETERIZATION    . CAROTID STENT  07/2015  . CHOLECYSTECTOMY    . CORONARY ANGIOPLASTY    . CORONARY ARTERY BYPASS GRAFT    . CORONARY STENT INTERVENTION N/A 01/30/2017   Procedure: Coronary Stent Intervention;  Surgeon: Marcina Millard, MD;  Location: [facility] INVASIVE CV LAB;  Service: Cardiovascular;  Laterality: N/A;  . CORONARY STENT INTERVENTION N/A 06/13/2017   Procedure: Coronary Stent Intervention;  Surgeon: Iran Ouch, MD;  Location: [facility] INVASIVE CV LAB;  Service: Cardiovascular;  Laterality: N/A;  . ESOPHAGOGASTRODUODENOSCOPY (EGD) WITH PROPOFOL N/A 11/01/2017   Procedure: ESOPHAGOGASTRODUODENOSCOPY (EGD) WITH PROPOFOL;  Surgeon: Scot Jun, MD;   Location: [facility] ENDOSCOPY;  Service: Endoscopy;  Laterality: N/A;  . EYE SURGERY Bilateral   . LEFT HEART CATH AND CORS/GRAFTS ANGIOGRAPHY N/A 01/30/2017   Procedure: Left Heart Cath and Cors/Grafts Angiography and possible PCI;  Surgeon: Marcina Millard, MD;  Location: [facility] INVASIVE CV LAB;  Service: Cardiovascular;  Laterality: N/A;  . LEFT HEART CATH AND CORS/GRAFTS ANGIOGRAPHY N/A 06/13/2017   Procedure: Left Heart Cath and Cors/Grafts Angiography;  Surgeon: Lamar Blinks, MD;  Location: [facility] INVASIVE CV LAB;  Service: Cardiovascular;  Laterality: N/A;  . NASAL SEPTUM SURGERY    . TONSILLECTOMY      Family History  Problem Relation Age of Onset  . Breast cancer Mother   . Diabetes Father   . CAD Father   . CVA Father   . Diabetes Brother     Social History   Socioeconomic History  . Marital status: Single    Spouse name: Not on file  . Number of children: Not on file  . Years of education: Not on file  . Highest education level: Not on file  Social Needs  . Financial resource strain: Not on file  . Food insecurity - worry: Not on file  . Food insecurity - inability: Not on file  . Transportation needs - medical: Not on file  . Transportation needs - non-medical: Not on file  Occupational History  . Occupation: self employed    Comment: Psychiatrist  Tobacco Use  . Smoking status: Never Smoker  . Smokeless tobacco:  Never Used  Substance and Sexual Activity  . Alcohol use: No    Alcohol/week: 0.0 oz    Comment: 1 glass wine/month  . Drug use: No    Comment: hemp oil  . Sexual activity: Not on file  Other Topics Concern  . Not on file  Social History Narrative  . Not on file    Allergies  Allergen Reactions  . Iodinated Diagnostic Agents Nausea Only and Other (See Comments)    Reaction:  Fever   . Sulfa Antibiotics Rash    Outpatient Medications Prior to Visit  Medication Sig Dispense Refill  . acetaminophen (TYLENOL) 500 MG tablet Take 500 mg by  mouth every 6 (six) hours as needed for mild pain.     Marland Kitchen albuterol (PROVENTIL HFA;VENTOLIN HFA) 108 (90 BASE) MCG/ACT inhaler Inhale 2 puffs into the lungs every 6 (six) hours as needed for wheezing or shortness of breath.    . ALPRAZolam (XANAX) 0.5 MG tablet Take 0.5 mg by mouth 3 (three) times daily. Dr Malvin Johns    . amoxicillin-clavulanate (AUGMENTIN) 875-125 MG tablet Take 1 tablet by mouth 2 (two) times daily for 7 days. 14 tablet 0  . APOKYN 30 MG/3ML SOCT INJECT 0.2ML SUBCUTANEOUSLY AS STARTER DOSE AND TITRATE TO EFFECTIVE DOSE (MAX OF 0.6ML) BASED ON MANUFACTURER GUIDELINES (UP TO 3 TIMES PER 30 Cartridge 1  . ARTIFICIAL TEAR OP Apply 1 drop to eye as needed (for dry eyes).    Marland Kitchen aspirin EC 81 MG tablet Take 81 mg by mouth daily.    . carbidopa-levodopa (SINEMET CR) 50-200 MG tablet Take 1 tablet by mouth at bedtime. 90 tablet 1  . carbidopa-levodopa (SINEMET IR) 25-100 MG tablet 3 in the morning/2 midmorning/3afternoon/2 midafternoon/2 evening (Patient taking differently: Take 3 tablets by mouth 4 (four) times daily. ) 1080 tablet 1  . carvedilol (COREG) 6.25 MG tablet Take 6.25 mg by mouth 2 (two) times daily.     . chlorhexidine (HIBICLENS) 4 % external liquid Apply topically daily as needed. 120 mL 0  . glucose blood test strip 1 each by Other route daily. And lancets 1/day 100 each 12  . ibuprofen (ADVIL,MOTRIN) 800 MG tablet Take 800 mg by mouth every 8 (eight) hours as needed.    . isosorbide mononitrate (IMDUR) 30 MG 24 hr tablet Take 1 tablet (30 mg total) by mouth daily. 30 tablet 0  . lisinopril (ZESTRIL) 2.5 MG tablet Take 1 tablet (2.5 mg total) by mouth daily. 30 tablet 0  . metFORMIN (GLUCOPHAGE-XR) 500 MG 24 hr tablet Take 1 tablet (500 mg total) by mouth daily with breakfast. 90 tablet 3  . mupirocin ointment (BACTROBAN) 2 % Apply 1 application topically 2 (two) times daily. 22 g 0  . neomycin-polymyxin-hydrocortisone (CORTISPORIN) 3.5-10000-1 OTIC suspension Place 3 drops  into both ears 4 (four) times daily. 10 mL 0  . nitroGLYCERIN (NITROSTAT) 0.4 MG SL tablet Place 0.4 mg under the tongue every 5 (five) minutes as needed for chest pain.    . pantoprazole (PROTONIX) 40 MG tablet Take 40 mg by mouth 2 (two) times daily.    . phenol (CHLORASEPTIC) 1.4 % LIQD Use as directed 1 spray in the mouth or throat as needed for throat irritation / pain.    . promethazine (PHENERGAN) 25 MG tablet Take 25 mg by mouth every 8 (eight) hours as needed for nausea or vomiting.    . rosuvastatin (CRESTOR) 20 MG tablet Take 20 mg by mouth at bedtime.    Marland Kitchen  ticagrelor (BRILINTA) 90 MG TABS tablet Take 90 mg by mouth every 12 (twelve) hours.    . valACYclovir (VALTREX) 500 MG tablet Take 500 mg by mouth 2 (two) times daily.    . fluticasone (FLONASE) 50 MCG/ACT nasal spray Place 2 sprays into both nostrils daily. 16 g 6  . empagliflozin (JARDIANCE) 10 MG TABS tablet Take 10 mg by mouth daily. (Patient not taking: Reported on 11/29/2017) 30 tablet 11  . trimethobenzamide (TIGAN) 300 MG capsule Take 1 capsule (300 mg total) 3 (three) times daily by mouth. (Patient not taking: Reported on 11/29/2017) 90 capsule 1  . cephALEXin (KEFLEX) 500 MG capsule Take 1 capsule (500 mg total) by mouth 4 (four) times daily. 40 capsule 1   No facility-administered medications prior to visit.     Review of Systems  Constitutional: Negative for chills, diaphoresis, fever, malaise/fatigue and weight loss.  HENT: Positive for congestion, ear pain, sinus pressure and sneezing. Negative for ear discharge, hoarse voice and sore throat.   Eyes: Negative for blurred vision.  Respiratory: Positive for cough and shortness of breath. Negative for sputum production and wheezing.   Cardiovascular: Negative for chest pain, palpitations and leg swelling.  Gastrointestinal: Negative for abdominal pain, blood in stool, constipation, diarrhea, heartburn, melena and nausea.  Genitourinary: Negative for dysuria,  frequency, hematuria and urgency.  Musculoskeletal: Negative for back pain, joint pain, myalgias and neck pain.  Skin: Negative for rash.  Neurological: Negative for dizziness, tingling, sensory change, focal weakness and headaches.  Endo/Heme/Allergies: Negative for environmental allergies and polydipsia. Does not bruise/bleed easily.  Psychiatric/Behavioral: Negative for depression and suicidal ideas. The patient is not nervous/anxious and does not have insomnia.      Objective  Vitals:   11/29/17 1530  BP: 112/62  Pulse: 76  Weight: 215 lb (97.5 kg)  Height: 5\' 11"  (1.803 m)    Physical Exam  Constitutional: He is oriented to person, place, and time and well-developed, well-nourished, and in no distress.  HENT:  Head: Normocephalic.  Right Ear: External ear normal.  Left Ear: External ear normal.  Nose: No mucosal edema or rhinorrhea. Right sinus exhibits frontal sinus tenderness. Right sinus exhibits no maxillary sinus tenderness. Left sinus exhibits frontal sinus tenderness. Left sinus exhibits no maxillary sinus tenderness.  Mouth/Throat: Oropharynx is clear and moist.  Eyes: Conjunctivae, EOM and lids are normal. Pupils are equal, round, and reactive to light. Right eye exhibits no discharge. Left eye exhibits no discharge. Right conjunctiva is not injected. Left conjunctiva is not injected. No scleral icterus.  Neck: Normal range of motion. Neck supple. No JVD present. No tracheal deviation present. No thyromegaly present.  Cardiovascular: Normal rate, regular rhythm, normal heart sounds and intact distal pulses. Exam reveals no gallop and no friction rub.  No murmur heard. Pulmonary/Chest: Breath sounds normal. No respiratory distress. He has no wheezes. He has no rales.  Abdominal: Soft. Bowel sounds are normal. He exhibits no mass. There is no hepatosplenomegaly. There is no tenderness. There is no rebound, no guarding and no CVA tenderness.  Musculoskeletal: Normal range  of motion. He exhibits no edema or tenderness.  Lymphadenopathy:    He has no cervical adenopathy.  Neurological: He is alert and oriented to person, place, and time. He has normal sensation, normal strength and intact cranial nerves. No cranial nerve deficit.  Skin: Skin is warm. No rash noted.  Psychiatric: Mood and affect normal.  Nursing note and vitals reviewed.     Assessment & Plan  Problem List Items Addressed This Visit    None    Visit Diagnoses    Acute frontal sinusitis, recurrence not specified       cont current medications/augmentin   Relevant Medications   fluticasone (FLONASE) 50 MCG/ACT nasal spray      Meds ordered this encounter  Medications  . fluticasone (FLONASE) 50 MCG/ACT nasal spray    Sig: Place 2 sprays into both nostrils daily.    Dispense:  16 g    Refill:  6      Dr. Elizabeth Sauereanna Jones [facility]  11/29/17

## 2017-12-12 ENCOUNTER — Encounter: Payer: Self-pay | Admitting: Family Medicine

## 2017-12-12 ENCOUNTER — Ambulatory Visit (INDEPENDENT_AMBULATORY_CARE_PROVIDER_SITE_OTHER): Payer: BLUE CROSS/BLUE SHIELD | Admitting: Family Medicine

## 2017-12-12 ENCOUNTER — Telehealth: Payer: Self-pay | Admitting: Endocrinology

## 2017-12-12 VITALS — BP 120/80 | HR 60 | Ht 71.0 in | Wt 215.0 lb

## 2017-12-12 DIAGNOSIS — H9202 Otalgia, left ear: Secondary | ICD-10-CM

## 2017-12-12 DIAGNOSIS — H6123 Impacted cerumen, bilateral: Secondary | ICD-10-CM | POA: Diagnosis not present

## 2017-12-12 DIAGNOSIS — H6592 Unspecified nonsuppurative otitis media, left ear: Secondary | ICD-10-CM

## 2017-12-12 MED ORDER — CIPROFLOXACIN-DEXAMETHASONE 0.3-0.1 % OT SUSP: 4.0000 [drp] | Freq: Two times a day (BID) | OTIC | 0 refills | Status: DC

## 2017-12-12 MED ORDER — AMOXICILLIN-POT CLAVULANATE 875-125 MG PO TABS: 1.0000 | ORAL_TABLET | Freq: Two times a day (BID) | ORAL | 0 refills | Status: DC

## 2017-12-12 NOTE — Telephone Encounter (Signed)
D/c jardiance I'll see you next week. 

## 2017-12-12 NOTE — Progress Notes (Signed)
Name: Sean EdisonRobert M Pruett   MRN: [id number]    DOB: 08/06/1956   Date:12/12/2017       Progress Note  Subjective  Chief Complaint  Chief Complaint  Patient presents with  . Otitis Media    L) ear only    Otalgia   There is pain in the left ear. This is a new problem. The current episode started today. The problem occurs constantly. The problem has been unchanged. There has been no fever. The pain is moderate. Pertinent negatives include no abdominal pain, coughing, diarrhea, ear discharge, headaches, hearing loss, neck pain, rash, rhinorrhea, sore throat or vomiting. He has tried nothing for the symptoms. The treatment provided no relief. His past medical history is significant for hearing loss. There is no history of a chronic ear infection.    No problem-specific Assessment & Plan notes found for this encounter.   Past Medical History:  Diagnosis Date  . Anxiety   . Coronary artery disease   . Hyperlipidemia   . Hypertension   . Parkinson's disease (HCC)   . Prediabetes     Past Surgical History:  Procedure Laterality Date  . APPENDECTOMY    . BALLOON DILATION N/A 11/01/2017   Procedure: BALLOON DILATION;  Surgeon: Scot JunElliott, Chilton T, MD;  [facility];  Service: Endoscopy;  Laterality: N/A;  . CARDIAC CATHETERIZATION    . CAROTID STENT  07/2015  . CHOLECYSTECTOMY    . CORONARY ANGIOPLASTY    . CORONARY ARTERY BYPASS GRAFT    . CORONARY STENT INTERVENTION N/A 01/30/2017   Procedure: Coronary Stent Intervention;  Surgeon: Marcina MillardAlexander Paraschos, MD;  Location: [facility] INVASIVE CV LAB;  Service: Cardiovascular;  Laterality: N/A;  . CORONARY STENT INTERVENTION N/A 06/13/2017   Procedure: Coronary Stent Intervention;  Surgeon: Iran OuchArida, Muhammad A, MD;  Location: [facility] INVASIVE CV LAB;  Service: Cardiovascular;  Laterality: N/A;  . ESOPHAGOGASTRODUODENOSCOPY (EGD) WITH PROPOFOL N/A 11/01/2017   Procedure: ESOPHAGOGASTRODUODENOSCOPY (EGD) WITH PROPOFOL;  Surgeon: Scot JunElliott, Taeshawn  T, MD;  [facility];  Service: Endoscopy;  Laterality: N/A;  . EYE SURGERY Bilateral   . LEFT HEART CATH AND CORS/GRAFTS ANGIOGRAPHY N/A 01/30/2017   Procedure: Left Heart Cath and Cors/Grafts Angiography and possible PCI;  Surgeon: Marcina MillardAlexander Paraschos, MD;  Location: [facility] INVASIVE CV LAB;  Service: Cardiovascular;  Laterality: N/A;  . LEFT HEART CATH AND CORS/GRAFTS ANGIOGRAPHY N/A 06/13/2017   Procedure: Left Heart Cath and Cors/Grafts Angiography;  Surgeon: Lamar BlinksKowalski, Bruce J, MD;  Location: [facility] INVASIVE CV LAB;  Service: Cardiovascular;  Laterality: N/A;  . NASAL SEPTUM SURGERY    . TONSILLECTOMY      Family History  Problem Relation Age of Onset  . Breast cancer Mother   . Diabetes Father   . CAD Father   . CVA Father   . Diabetes Brother     Social History   Socioeconomic History  . Marital status: Single    Spouse name: Not on file  . Number of children: Not on file  . Years of education: Not on file  . Highest education level: Not on file  Social Needs  . Financial resource strain: Not on file  . Food insecurity - worry: Not on file  . Food insecurity - inability: Not on file  . Transportation needs - medical: Not on file  . Transportation needs - non-medical: Not on file  Occupational History  . Occupation: self employed    Comment: Psychiatristtoe truck driver  Tobacco Use  . Smoking status: Never  Smoker  . Smokeless tobacco: Never Used  Substance and Sexual Activity  . Alcohol use: No    Alcohol/week: 0.0 oz    Comment: 1 glass wine/month  . Drug use: No    Comment: hemp oil  . Sexual activity: Not on file  Other Topics Concern  . Not on file  Social History Narrative  . Not on file    Allergies  Allergen Reactions  . Iodinated Diagnostic Agents Nausea Only and Other (See Comments)    Reaction:  Fever   . Sulfa Antibiotics Rash    Outpatient Medications Prior to Visit  Medication Sig Dispense Refill  . acetaminophen (TYLENOL) 500 MG tablet Take  500 mg by mouth every 6 (six) hours as needed for mild pain.     Marland Kitchen albuterol (PROVENTIL HFA;VENTOLIN HFA) 108 (90 BASE) MCG/ACT inhaler Inhale 2 puffs into the lungs every 6 (six) hours as needed for wheezing or shortness of breath.    . ALPRAZolam (XANAX) 0.5 MG tablet Take 0.5 mg by mouth 3 (three) times daily. Dr Malvin Johns    . APOKYN 30 MG/3ML SOCT INJECT 0.2ML SUBCUTANEOUSLY AS STARTER DOSE AND TITRATE TO EFFECTIVE DOSE (MAX OF 0.6ML) BASED ON MANUFACTURER GUIDELINES (UP TO 3 TIMES PER 30 Cartridge 1  . ARTIFICIAL TEAR OP Apply 1 drop to eye as needed (for dry eyes).    Marland Kitchen aspirin EC 81 MG tablet Take 81 mg by mouth daily.    . carbidopa-levodopa (SINEMET CR) 50-200 MG tablet Take 1 tablet by mouth at bedtime. 90 tablet 1  . carbidopa-levodopa (SINEMET IR) 25-100 MG tablet 3 in the morning/2 midmorning/3afternoon/2 midafternoon/2 evening (Patient taking differently: Take 3 tablets by mouth 4 (four) times daily. ) 1080 tablet 1  . carvedilol (COREG) 6.25 MG tablet Take 6.25 mg by mouth 2 (two) times daily.     . chlorhexidine (HIBICLENS) 4 % external liquid Apply topically daily as needed. 120 mL 0  . empagliflozin (JARDIANCE) 10 MG TABS tablet Take 10 mg by mouth daily. 30 tablet 11  . fluticasone (FLONASE) 50 MCG/ACT nasal spray Place 2 sprays into both nostrils daily. 16 g 6  . glucose blood test strip 1 each by Other route daily. And lancets 1/day 100 each 12  . isosorbide mononitrate (IMDUR) 30 MG 24 hr tablet Take 1 tablet (30 mg total) by mouth daily. 30 tablet 0  . lisinopril (ZESTRIL) 2.5 MG tablet Take 1 tablet (2.5 mg total) by mouth daily. 30 tablet 0  . metFORMIN (GLUCOPHAGE-XR) 500 MG 24 hr tablet Take 1 tablet (500 mg total) by mouth daily with breakfast. 90 tablet 3  . mupirocin ointment (BACTROBAN) 2 % Apply 1 application topically 2 (two) times daily. 22 g 0  . neomycin-polymyxin-hydrocortisone (CORTISPORIN) 3.5-10000-1 OTIC suspension Place 3 drops into both ears 4 (four) times  daily. 10 mL 0  . nitroGLYCERIN (NITROSTAT) 0.4 MG SL tablet Place 0.4 mg under the tongue every 5 (five) minutes as needed for chest pain.    . pantoprazole (PROTONIX) 40 MG tablet Take 40 mg by mouth 2 (two) times daily.    . phenol (CHLORASEPTIC) 1.4 % LIQD Use as directed 1 spray in the mouth or throat as needed for throat irritation / pain.    . promethazine (PHENERGAN) 25 MG tablet Take 25 mg by mouth every 8 (eight) hours as needed for nausea or vomiting.    . rosuvastatin (CRESTOR) 20 MG tablet Take 20 mg by mouth at bedtime.    Marland Kitchen  ticagrelor (BRILINTA) 90 MG TABS tablet Take 90 mg by mouth every 12 (twelve) hours.    . valACYclovir (VALTREX) 500 MG tablet Take 500 mg by mouth 2 (two) times daily.    Marland Kitchen ibuprofen (ADVIL,MOTRIN) 800 MG tablet Take 800 mg by mouth every 8 (eight) hours as needed.    . trimethobenzamide (TIGAN) 300 MG capsule Take 1 capsule (300 mg total) 3 (three) times daily by mouth. (Patient not taking: Reported on 11/29/2017) 90 capsule 1   No facility-administered medications prior to visit.     Review of Systems  Constitutional: Negative for chills, fever, malaise/fatigue and weight loss.  HENT: Positive for ear pain and tinnitus. Negative for congestion, ear discharge, hearing loss, nosebleeds, rhinorrhea, sinus pain and sore throat.   Eyes: Negative for blurred vision.  Respiratory: Negative for cough, sputum production, shortness of breath, wheezing and stridor.   Cardiovascular: Negative for chest pain, palpitations and leg swelling.  Gastrointestinal: Negative for abdominal pain, blood in stool, constipation, diarrhea, heartburn, melena, nausea and vomiting.  Genitourinary: Negative for dysuria, frequency, hematuria and urgency.  Musculoskeletal: Negative for back pain, joint pain, myalgias and neck pain.  Skin: Negative for rash.  Neurological: Negative for dizziness, tingling, sensory change, focal weakness and headaches.  Endo/Heme/Allergies: Negative for  environmental allergies and polydipsia. Does not bruise/bleed easily.  Psychiatric/Behavioral: Negative for depression and suicidal ideas. The patient is not nervous/anxious and does not have insomnia.      Objective  Vitals:   12/12/17 1113  BP: 120/80  Pulse: 60  Weight: 215 lb (97.5 kg)  Height: 5\' 11"  (1.803 m)    Physical Exam  Constitutional: He is oriented to person, place, and time and well-developed, well-nourished, and in no distress.  HENT:  Head: Normocephalic.  Right Ear: Hearing, tympanic membrane, external ear and ear canal normal.  Left Ear: External ear normal. Tympanic membrane is perforated. Decreased hearing is noted.  Nose: Nose normal.  Mouth/Throat: Oropharynx is clear and moist.  Bilateral cerumen impaction  Eyes: Conjunctivae and EOM are normal. Pupils are equal, round, and reactive to light. Right eye exhibits no discharge. Left eye exhibits no discharge. No scleral icterus.  Neck: Normal range of motion. Neck supple. No JVD present. No tracheal deviation present. No thyromegaly present.  Cardiovascular: Normal rate, regular rhythm, normal heart sounds and intact distal pulses. Exam reveals no gallop and no friction rub.  No murmur heard. Pulmonary/Chest: Breath sounds normal. No respiratory distress. He has no wheezes. He has no rales.  Abdominal: Soft. Bowel sounds are normal. He exhibits no mass. There is no hepatosplenomegaly. There is no tenderness. There is no rebound, no guarding and no CVA tenderness.  Musculoskeletal: Normal range of motion. He exhibits no edema or tenderness.  Lymphadenopathy:    He has no cervical adenopathy.  Neurological: He is alert and oriented to person, place, and time. He has normal sensation, normal strength, normal reflexes and intact cranial nerves. No cranial nerve deficit.  Skin: Skin is warm. No rash noted.  Psychiatric: Mood and affect normal.  Nursing note and vitals reviewed.     Assessment &  Plan  Problem List Items Addressed This Visit    None    Visit Diagnoses    Otalgia of left ear    -  Primary   Bilateral impacted cerumen       Left serous otitis media, unspecified chronicity       Relevant Medications   amoxicillin-clavulanate (AUGMENTIN) 875-125 MG tablet  Meds ordered this encounter  Medications  . amoxicillin-clavulanate (AUGMENTIN) 875-125 MG tablet    Sig: Take 1 tablet by mouth 2 (two) times daily.    Dispense:  20 tablet    Refill:  0  . ciprofloxacin-dexamethasone (CIPRODEX) OTIC suspension    Sig: Place 4 drops into the left ear 2 (two) times daily.    Dispense:  7.5 mL    Refill:  0      Dr. Elizabeth Sauer [facility]  12/12/17

## 2017-12-12 NOTE — Telephone Encounter (Signed)
I called patient & told him to d/c Jardiance.

## 2017-12-12 NOTE — Telephone Encounter (Signed)
Pt has found that the jardiance is causing breathing issues with a terrible rash in his throat
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
## 2017-12-19 ENCOUNTER — Ambulatory Visit (INDEPENDENT_AMBULATORY_CARE_PROVIDER_SITE_OTHER): Payer: BLUE CROSS/BLUE SHIELD | Admitting: Neurology

## 2017-12-19 ENCOUNTER — Encounter: Payer: Self-pay | Admitting: Neurology

## 2017-12-19 VITALS — BP 124/80 | HR 60 | Ht 71.0 in | Wt 217.0 lb

## 2017-12-19 DIAGNOSIS — G20 Parkinson's disease: Secondary | ICD-10-CM

## 2017-12-19 NOTE — Patient Instructions (Signed)
1. Stay off Parkinson's medications the day of your next appointment.   2. We will contact you when Inbrija is available.

## 2017-12-20 ENCOUNTER — Ambulatory Visit: Payer: BLUE CROSS/BLUE SHIELD | Admitting: Neurology

## 2017-12-20 ENCOUNTER — Ambulatory Visit: Payer: BLUE CROSS/BLUE SHIELD | Admitting: Endocrinology

## 2017-12-27 ENCOUNTER — Ambulatory Visit: Payer: BLUE CROSS/BLUE SHIELD | Admitting: Endocrinology

## 2018-01-02 ENCOUNTER — Encounter: Payer: Self-pay | Admitting: Endocrinology

## 2018-01-02 ENCOUNTER — Emergency Department: Payer: BLUE CROSS/BLUE SHIELD

## 2018-01-02 ENCOUNTER — Ambulatory Visit (INDEPENDENT_AMBULATORY_CARE_PROVIDER_SITE_OTHER): Payer: BLUE CROSS/BLUE SHIELD | Admitting: Endocrinology

## 2018-01-02 ENCOUNTER — Emergency Department
Admission: EM | Admit: 2018-01-02 | Discharge: 2018-01-03 | Disposition: A | Discharge status: Home / Self Care | Payer: BLUE CROSS/BLUE SHIELD | Attending: Emergency Medicine | Admitting: Emergency Medicine

## 2018-01-02 ENCOUNTER — Other Ambulatory Visit: Payer: Self-pay

## 2018-01-02 VITALS — BP 98/47 | HR 63 | Wt 214.8 lb

## 2018-01-02 DIAGNOSIS — G20 Parkinson's disease: Secondary | ICD-10-CM | POA: Diagnosis not present

## 2018-01-02 DIAGNOSIS — Z7982 Long term (current) use of aspirin: Secondary | ICD-10-CM | POA: Diagnosis not present

## 2018-01-02 DIAGNOSIS — E1159 Type 2 diabetes mellitus with other circulatory complications: Secondary | ICD-10-CM | POA: Diagnosis not present

## 2018-01-02 DIAGNOSIS — Z79899 Other long term (current) drug therapy: Secondary | ICD-10-CM | POA: Insufficient documentation

## 2018-01-02 DIAGNOSIS — R079 Chest pain, unspecified: Secondary | ICD-10-CM | POA: Insufficient documentation

## 2018-01-02 DIAGNOSIS — E119 Type 2 diabetes mellitus without complications: Secondary | ICD-10-CM | POA: Insufficient documentation

## 2018-01-02 DIAGNOSIS — I2510 Atherosclerotic heart disease of native coronary artery without angina pectoris: Secondary | ICD-10-CM | POA: Insufficient documentation

## 2018-01-02 DIAGNOSIS — Z951 Presence of aortocoronary bypass graft: Secondary | ICD-10-CM | POA: Insufficient documentation

## 2018-01-02 DIAGNOSIS — Z955 Presence of coronary angioplasty implant and graft: Secondary | ICD-10-CM | POA: Insufficient documentation

## 2018-01-02 DIAGNOSIS — I10 Essential (primary) hypertension: Secondary | ICD-10-CM | POA: Insufficient documentation

## 2018-01-02 DIAGNOSIS — Z7984 Long term (current) use of oral hypoglycemic drugs: Secondary | ICD-10-CM | POA: Diagnosis not present

## 2018-01-02 LAB — CBC WITH DIFFERENTIAL/PLATELET
Basophils Absolute: 0 10*3/uL (ref 0–0.1)
Basophils Relative: 1 %
EOS ABS: 0.1 10*3/uL (ref 0–0.7)
EOS PCT: 4 %
HCT: 43.2 % (ref 40.0–52.0)
Hemoglobin: 14.5 g/dL (ref 13.0–18.0)
LYMPHS ABS: 0.6 10*3/uL — AB (ref 1.0–3.6)
Lymphocytes Relative: 18 %
MCH: 31.4 pg (ref 26.0–34.0)
MCHC: 33.6 g/dL (ref 32.0–36.0)
MCV: 93.4 fL (ref 80.0–100.0)
MONO ABS: 0.4 10*3/uL (ref 0.2–1.0)
MONOS PCT: 11 %
Neutro Abs: 2.2 10*3/uL (ref 1.4–6.5)
Neutrophils Relative %: 66 %
PLATELETS: 145 10*3/uL — AB (ref 150–440)
RBC: 4.62 MIL/uL (ref 4.40–5.90)
RDW: 14.7 % — ABNORMAL HIGH (ref 11.5–14.5)
WBC: 3.3 10*3/uL — AB (ref 3.8–10.6)

## 2018-01-02 LAB — BASIC METABOLIC PANEL
Anion gap: 6 (ref 5–15)
BUN: 15 mg/dL (ref 6–20)
CO2: 29 mmol/L (ref 22–32)
CREATININE: 1.06 mg/dL (ref 0.61–1.24)
Calcium: 9.3 mg/dL (ref 8.9–10.3)
Chloride: 102 mmol/L (ref 101–111)
GFR calc Af Amer: >60 mL/min (ref >60)
Glucose, Bld: 165 mg/dL — ABNORMAL HIGH (ref 65–99)
Potassium: 4 mmol/L (ref 3.5–5.1)
SODIUM: 137 mmol/L (ref 135–145)

## 2018-01-02 LAB — TROPONIN I: Troponin I: <0.03 ng/mL (ref <0.03)

## 2018-01-02 LAB — POCT GLYCOSYLATED HEMOGLOBIN (HGB A1C): Hemoglobin A1C: 7.1

## 2018-01-02 MED ORDER — TICAGRELOR 90 MG PO TABS
90.0000 mg | ORAL_TABLET | Freq: Once | ORAL | Status: AC
  Administered 2018-01-02: 90 mg via ORAL
  Filled 2018-01-02: qty 1

## 2018-01-02 MED ORDER — SITAGLIPTIN PHOSPHATE 50 MG PO TABS: 50.0000 mg | ORAL_TABLET | Freq: Every day | ORAL | 11 refills | Status: DC

## 2018-01-02 NOTE — ED Provider Notes (Signed)
[facility] Emergency Department Provider Note  ____________________________________________  Time seen: Approximately 8:38 PM  I have reviewed the triage vital signs and the nursing notes.   HISTORY  Chief Complaint Chest Pain   HPI Sean Barajas is a 62 y.o. male with a history of CAD status post several stents on Brilinta and ASA, Parkinson's disease, hypertension, hyperlipidemia, anxiety, GERD who presents for evaluation of chest pain. Patient reports that he was walking from his car to his house coming home from his doctor's appointment when he started having chest pain. He reports that the pain was severe, pressure, located in the center of his chest, associated with severe shortness of breath and radiating down his left arm. The pain was constant for an hour and a half. He took 3 nitros and a full dose of aspirin and the pain resolved. He is asymptomatic at this time. Patient reports that the pain is similar to prior episodes of heart attack. He endorses compliance with his blood thinners. No recent illness, no cough, no congestion, no fever, no nausea, no vomiting.  Past Medical History:  Diagnosis Date  . Anxiety   . Coronary artery disease   . Hyperlipidemia   . Hypertension   . Parkinson's disease (HCC)   . Prediabetes     Patient Active Problem List   Diagnosis Date Noted  . Dysphagia   . Esophageal stricture   . Chest pain 07/31/2017  . Diabetes (HCC) 06/26/2017  . CAD (coronary artery disease) 01/30/2017  . Unstable angina (HCC) 01/29/2017  . Aortic atherosclerosis (HCC) 08/28/2016    Past Surgical History:  Procedure Laterality Date  . APPENDECTOMY    . BALLOON DILATION N/A 11/01/2017   Procedure: BALLOON DILATION;  Surgeon: Scot Jun, MD;  Location: [facility] ENDOSCOPY;  Service: Endoscopy;  Laterality: N/A;  . CARDIAC CATHETERIZATION    . CAROTID STENT  07/2015  . CHOLECYSTECTOMY    . CORONARY ANGIOPLASTY    . CORONARY  ARTERY BYPASS GRAFT    . CORONARY STENT INTERVENTION N/A 01/30/2017   Procedure: Coronary Stent Intervention;  Surgeon: Marcina Millard, MD;  Location: [facility] INVASIVE CV LAB;  Service: Cardiovascular;  Laterality: N/A;  . CORONARY STENT INTERVENTION N/A 06/13/2017   Procedure: Coronary Stent Intervention;  Surgeon: Iran Ouch, MD;  Location: [facility] INVASIVE CV LAB;  Service: Cardiovascular;  Laterality: N/A;  . ESOPHAGOGASTRODUODENOSCOPY (EGD) WITH PROPOFOL N/A 11/01/2017   Procedure: ESOPHAGOGASTRODUODENOSCOPY (EGD) WITH PROPOFOL;  Surgeon: Scot Jun, MD;  Location: [facility] ENDOSCOPY;  Service: Endoscopy;  Laterality: N/A;  . EYE SURGERY Bilateral   . LEFT HEART CATH AND CORS/GRAFTS ANGIOGRAPHY N/A 01/30/2017   Procedure: Left Heart Cath and Cors/Grafts Angiography and possible PCI;  Surgeon: Marcina Millard, MD;  Location: [facility] INVASIVE CV LAB;  Service: Cardiovascular;  Laterality: N/A;  . LEFT HEART CATH AND CORS/GRAFTS ANGIOGRAPHY N/A 06/13/2017   Procedure: Left Heart Cath and Cors/Grafts Angiography;  Surgeon: Lamar Blinks, MD;  Location: [facility] INVASIVE CV LAB;  Service: Cardiovascular;  Laterality: N/A;  . NASAL SEPTUM SURGERY    . TONSILLECTOMY      Prior to Admission medications   Medication Sig Start Date End Date Taking? Authorizing Provider  acetaminophen (TYLENOL) 500 MG tablet Take 500 mg by mouth every 6 (six) hours as needed for mild pain.     [provider]  albuterol (PROVENTIL HFA;VENTOLIN HFA) 108 (90 BASE) MCG/ACT inhaler Inhale 2 puffs into the lungs every 6 (six) hours as needed  for wheezing or shortness of breath.    [provider]  ALPRAZolam Prudy Feeler(XANAX) 0.5 MG tablet Take 0.5 mg by mouth 3 (three) times daily. Dr Malvin JohnsPotter    [provider]  APOKYN 30 MG/3ML SOCT INJECT 0.2ML SUBCUTANEOUSLY AS STARTER DOSE AND TITRATE TO EFFECTIVE DOSE (MAX OF 0.6ML) BASED ON MANUFACTURER GUIDELINES (UP TO 3 TIMES PER 10/15/17   Tat, Rebecca S,  DO  ARTIFICIAL TEAR OP Apply 1 drop to eye as needed (for dry eyes).    [provider]  aspirin EC 81 MG tablet Take 81 mg by mouth daily.    [provider]  carbidopa-levodopa (SINEMET CR) 50-200 MG tablet Take 1 tablet by mouth at bedtime. 12/28/16   Tat, Octaviano Battyebecca S, DO  carbidopa-levodopa (SINEMET IR) 25-100 MG tablet 3 in the morning/2 midmorning/3afternoon/2 midafternoon/2 evening Patient taking differently: Take 3 tablets by mouth 4 (four) times daily.  09/02/17   Tat, Octaviano Battyebecca S, DO  carvedilol (COREG) 6.25 MG tablet Take 6.25 mg by mouth 2 (two) times daily.     [provider]  chlorhexidine (HIBICLENS) 4 % external liquid Apply topically daily as needed. 09/30/17   Duanne LimerickJones, Deanna C, MD  fluticasone (FLONASE) 50 MCG/ACT nasal spray Place 2 sprays into both nostrils daily. 11/29/17   Duanne LimerickJones, Deanna C, MD  glucose blood test strip 1 each by Other route daily. And lancets 1/day 06/25/17   Romero BellingEllison, Sean, MD  isosorbide mononitrate (IMDUR) 30 MG 24 hr tablet Take 1 tablet (30 mg total) by mouth daily. 01/31/17   Delfino LovettShah, Vipul, MD  lisinopril (ZESTRIL) 2.5 MG tablet Take 1 tablet (2.5 mg total) by mouth daily. 01/31/17   Delfino LovettShah, Vipul, MD  metFORMIN (GLUCOPHAGE-XR) 500 MG 24 hr tablet Take 1 tablet (500 mg total) by mouth daily with breakfast. 09/25/17   Romero BellingEllison, Sean, MD  mupirocin ointment (BACTROBAN) 2 % Apply 1 application topically 2 (two) times daily. 09/30/17   Duanne LimerickJones, Deanna C, MD  neomycin-polymyxin-hydrocortisone (CORTISPORIN) 3.5-10000-1 OTIC suspension Place 3 drops into both ears 4 (four) times daily. 06/17/17   Duanne LimerickJones, Deanna C, MD  nitroGLYCERIN (NITROSTAT) 0.4 MG SL tablet Place 0.4 mg under the tongue every 5 (five) minutes as needed for chest pain.    [provider]  pantoprazole (PROTONIX) 40 MG tablet Take 40 mg by mouth 2 (two) times daily.    [provider]  phenol (CHLORASEPTIC) 1.4 % LIQD Use as directed 1 spray in the mouth or throat as  needed for throat irritation / pain.    [provider]  promethazine (PHENERGAN) 25 MG tablet Take 25 mg by mouth every 8 (eight) hours as needed for nausea or vomiting.    [provider]  rosuvastatin (CRESTOR) 20 MG tablet Take 20 mg by mouth at bedtime.    [provider]  rotigotine (NEUPRO) 4 MG/24HR Place 1 patch onto the skin daily.    [provider]  sitaGLIPtin (JANUVIA) 50 MG tablet Take 1 tablet (50 mg total) by mouth daily. 01/02/18   Romero BellingEllison, Sean, MD  ticagrelor (BRILINTA) 90 MG TABS tablet Take 90 mg by mouth every 12 (twelve) hours.    [provider]  valACYclovir (VALTREX) 500 MG tablet Take 500 mg by mouth 2 (two) times daily.    [provider]    Allergies Iodinated diagnostic agents and Sulfa antibiotics  Family History  Problem Relation Age of Onset  . Breast cancer Mother   . Diabetes Father   . CAD  Father   . CVA Father   . Diabetes Brother     Social History Social History   Tobacco Use  . Smoking status: Never Smoker  . Smokeless tobacco: Never Used  Substance Use Topics  . Alcohol use: No    Alcohol/week: 0.0 oz    Comment: 1 glass wine/month  . Drug use: No    Comment: hemp oil    Review of Systems  Constitutional: Negative for fever. Eyes: Negative for visual changes. ENT: Negative for sore throat. Neck: No neck pain  Cardiovascular: + chest pain. Respiratory: + shortness of breath. Gastrointestinal: Negative for abdominal pain, vomiting or diarrhea. Genitourinary: Negative for dysuria. Musculoskeletal: Negative for back pain. Skin: Negative for rash. Neurological: Negative for headaches, weakness or numbness. Psych: No SI or HI  ____________________________________________   PHYSICAL EXAM:  VITAL SIGNS: ED Triage Vitals [01/02/18 2030]  Enc Vitals Group     BP (!) 152/90     Pulse Rate 60     Resp 18     Temp 98.1 F (36.7 C)     Temp Source Oral     SpO2 96 %      Weight 208 lb (94.3 kg)     Height 5\' 10"  (1.778 m)     Head Circumference      Peak Flow      Pain Score 1     Pain Loc      Pain Edu?      Excl. in GC?     Constitutional: Alert and oriented. Well appearing and in no apparent distress. HEENT:      Head: Normocephalic and atraumatic.         Eyes: Conjunctivae are normal. Sclera is non-icteric.       Mouth/Throat: Mucous membranes are moist.       Neck: Supple with no signs of meningismus. Cardiovascular: Regular rate and rhythm. No murmurs, gallops, or rubs. 2+ symmetrical distal pulses are present in all extremities. No JVD. Respiratory: Normal respiratory effort. Lungs are clear to auscultation bilaterally. No wheezes, crackles, or rhonchi.  Gastrointestinal: Soft, non tender, and non distended with positive bowel sounds. No rebound or guarding. Musculoskeletal: Nontender with normal range of motion in all extremities. No edema, cyanosis, or erythema of extremities. Neurologic: Normal speech and language. Face is symmetric. Moving all extremities. No gross focal neurologic deficits are appreciated. Skin: Skin is warm, dry and intact. No rash noted. Psychiatric: Mood and affect are normal. Speech and behavior are normal.  ____________________________________________   LABS (all labs ordered are listed, but only abnormal results are displayed)  Labs Reviewed  CBC WITH DIFFERENTIAL/PLATELET - Abnormal; Notable for the following components:      Result Value   WBC 3.3 (*)    RDW 14.7 (*)    Platelets 145 (*)    Lymphs Abs 0.6 (*)    All other components within normal limits  BASIC METABOLIC PANEL - Abnormal; Notable for the following components:   Glucose, Bld 165 (*)    All other components within normal limits  TROPONIN I  TROPONIN I   ____________________________________________  EKG  ED ECG REPORT I, Nita Sickle, the attending physician, personally viewed and interpreted this ECG.  Normal sinus rhythm, rate  of 60, normal intervals, normal axis, no ST elevations or depressions.unchanged from prior from 07/2017 ____________________________________________  RADIOLOGY  Interpreted by me: CXR: negative   Interpretation by Radiologist:  Dg Chest 2 View  Result Date: 01/02/2018 CLINICAL DATA:  Patient with chest pain. EXAM: CHEST  2 VIEW COMPARISON:  Chest radiograph 11/26/2017 FINDINGS: Monitoring leads overlie the patient. Stable cardiac and mediastinal contours. Aortic atherosclerosis. No consolidative pulmonary opacities. No pleural effusion or pneumothorax. Thoracic spine degenerative changes. Flowing anterior osteophytes. IMPRESSION: No acute cardiopulmonary process. Electronically Signed   By: Annia Belt M.D.   On: 01/02/2018 20:57     ____________________________________________   PROCEDURES  Procedure(s) performed: None Procedures Critical Care performed:  None ____________________________________________   INITIAL IMPRESSION / ASSESSMENT AND PLAN / ED COURSE   62 y.o. male with a history of CAD status post several stents on Brilinta and ASA, Parkinson's disease, hypertension, hyperlipidemia, anxiety, GERD who presents for evaluation of an episode of severe chest pressure associated with SOB and radiating to left arm. Patient reports the pain is similar to prior heart attacks. He has no pain at this time after taking 3 sublingual nitros and a full dose of aspirin. His EKG shows no evidence of ischemia. Labs pending. Presentation concerning for ACS. anticipated admission.  Clinical Course as of Jan 02 2257  Thu Jan 02, 2018  2111 first troponin is negative however patient wishes to go home and not be admitted to the hospital. We discussed that if his second troponin at 11:30 PM is negative and he remains pain-free he could go home and follow-up with his cardiologist in the morning however if the pain returns or if his second cardiac enzyme is elevated he will then agree with admission. I  discussed risks of leaving especially since I'm highly concerned that this episode was consistent with ACS. Patient understands the risks but continues to prefer to go home if labs are within normal limits.  [CV]  2254 patient remains chest pain-free. Second troponin is due at 11:30 PM. Care transferred to Dr York Cerise  [CV]    Clinical Course User Index [CV] Don Perking Washington, MD     As part of my medical decision making, I reviewed the following data within the electronic MEDICAL RECORD NUMBER Nursing notes reviewed and incorporated, Labs reviewed , EKG interpreted , Old EKG reviewed, Patient signed out to Dr. York Cerise, Radiograph reviewed , Notes from prior ED visits and [place] Controlled Substance Database    Pertinent labs & imaging results that were available during my care of the patient were reviewed by me and considered in my medical decision making (see chart for details).    ____________________________________________   FINAL CLINICAL IMPRESSION(S) / ED DIAGNOSES  Final diagnoses:  Chest pain, unspecified type      NEW MEDICATIONS STARTED DURING THIS VISIT:  ED Discharge Orders    None       Note:  This document was prepared using Dragon voice recognition software and may include unintentional dictation errors.    Nita Sickle, MD 01/02/18 2258

## 2018-01-02 NOTE — Discharge Instructions (Signed)
As I explained to you, I am concerned that your chest pain was coming from your heart. Please follow up with your Cardiologist in 1 -2 days. If the chest pain returns please call 911 and return to the ER.

## 2018-01-02 NOTE — ED Provider Notes (Signed)
Dg Chest 2 View  Result Date: 01/02/2018 CLINICAL DATA:  Patient with chest pain. EXAM: CHEST  2 VIEW COMPARISON:  Chest radiograph 11/26/2017 FINDINGS: Monitoring leads overlie the patient. Stable cardiac and mediastinal contours. Aortic atherosclerosis. No consolidative pulmonary opacities. No pleural effusion or pneumothorax. Thoracic spine degenerative changes. Flowing anterior osteophytes. IMPRESSION: No acute cardiopulmonary process. Electronically Signed   By: Annia Beltrew  Davis M.D.   On: 01/02/2018 20:57     Labs Reviewed  CBC WITH DIFFERENTIAL/PLATELET - Abnormal; Notable for the following components:      Result Value   WBC 3.3 (*)    RDW 14.7 (*)    Platelets 145 (*)    Lymphs Abs 0.6 (*)    All other components within normal limits  BASIC METABOLIC PANEL - Abnormal; Notable for the following components:   Glucose, Bld 165 (*)    All other components within normal limits  TROPONIN I  TROPONIN I     Clinical Course as of Jan 03 31  Thu Jan 02, 2018  2111 first troponin is negative however patient wishes to go home and not be admitted to the hospital. We discussed that if his second troponin at 11:30 PM is negative and he remains pain-free he could go home and follow-up with his cardiologist in the morning however if the pain returns or if his second cardiac enzyme is elevated he will then agree with admission. I discussed risks of leaving especially since I'm highly concerned that this episode was consistent with ACS. Patient understands the risks but continues to prefer to go home if labs are within normal limits.  [CV]  2254 patient remains chest pain-free. Second troponin is due at 11:30 PM. Care transferred to Dr York CeriseForbach  [CV]  2303 Assuming care from Dr. Don PerkingVeronese.  In short, Sean Barajas is a 62 y.o. male with a chief complaint of chest pain.  Refer to the original H&P for additional details.  The current plan of care is to follow up troponin.  Patient does not want to be  admitted, and knows to follow up as an outpatient ASAP with Dr. Darrold JunkerParaschos.   [CF]  Fri Jan 03, 2018  0030 Patient states that he feels fine and has had no more pain.  I once again offered to admit him to the hospital and explained that Dr. Michell HeinrichVernice was interested in keeping him given the history of chest pain and wanted to make sure that this is not some form of cardiac related pain.  He states that he understands but wants to go home and promises that he will follow-up as an outpatient.  He is comfortable with the plan and I reiterated the return precautions and follow-up recommendations.  [CF]    Clinical Course User Index [CF] Loleta RoseForbach, Vernice Bowker, MD [CV] Nita SickleVeronese, , MD     Final diagnoses:  Chest pain, unspecified type      Loleta RoseForbach, Kc Summerson, MD 01/03/18 [phone]

## 2018-01-02 NOTE — Patient Instructions (Signed)
Please continue the same metformin, and: I have sent a prescription to your pharmacy, to add "januvia." check your blood sugar once a day.  vary the time of day when you check, between before the 3 meals, and at bedtime.  also check if you have symptoms of your blood sugar being too high or too low.  please keep a record of the readings and bring it to your next appointment here (or you can bring the meter itself).  You can write it on any piece of paper.  please call us sooner if your blood sugar goes below 70, or if you have a lot of readings over 200.  Please come back for a follow-up appointment in 3 months.

## 2018-01-02 NOTE — ED Notes (Signed)
Patient transported to X-ray 

## 2018-01-02 NOTE — ED Triage Notes (Signed)
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 Pt states he saw Dr. Darrold JunkerParaschos today [facility] on Imdur. Pt states when he laid down tonight CP returned, pt took 324 aspirin and 3 nitros. Pt A&O at this time. Pt states CP has improved since medication.

## 2018-01-02 NOTE — Progress Notes (Signed)
Subjective:    Patient ID: Sean Barajas, male    DOB: 12/02/1956, 62 y.o.   MRN: [id number]  HPI Pt returns for f/u of diabetes mellitus:  DM type: 2 Dx'ed: 2018 Complications: CAD Therapy: mewtformin DKA: never Severe hypoglycemia: never Pancreatitis: never Pancreatic imaging: normal on 2018 CT.  Other: he has never been on insulin; edema limits rx options Interval history: nausea is resolved.  pt states he feels well in general.  He stopped jardiance, as he thought this caused esophageal yeast infection. Pt says BP was normal at cardiol appt today.    Past Medical History:  Diagnosis Date  . Anxiety   . Coronary artery disease   . Hyperlipidemia   . Hypertension   . Parkinson's disease (HCC)   . Prediabetes     Past Surgical History:  Procedure Laterality Date  . APPENDECTOMY    . BALLOON DILATION N/A 11/01/2017   Procedure: BALLOON DILATION;  Surgeon: Scot JunElliott, Django T, MD;  [facility];  Service: Endoscopy;  Laterality: N/A;  . CARDIAC CATHETERIZATION    . CAROTID STENT  07/2015  . CHOLECYSTECTOMY    . CORONARY ANGIOPLASTY    . CORONARY ARTERY BYPASS GRAFT    . CORONARY STENT INTERVENTION N/A 01/30/2017   Procedure: Coronary Stent Intervention;  Surgeon: Marcina MillardAlexander Paraschos, MD;  Location: [facility] INVASIVE CV LAB;  Service: Cardiovascular;  Laterality: N/A;  . CORONARY STENT INTERVENTION N/A 06/13/2017   Procedure: Coronary Stent Intervention;  Surgeon: Iran OuchArida, Muhammad A, MD;  Location: [facility] INVASIVE CV LAB;  Service: Cardiovascular;  Laterality: N/A;  . ESOPHAGOGASTRODUODENOSCOPY (EGD) WITH PROPOFOL N/A 11/01/2017   Procedure: ESOPHAGOGASTRODUODENOSCOPY (EGD) WITH PROPOFOL;  Surgeon: Scot JunElliott, Dashon T, MD;  [facility];  Service: Endoscopy;  Laterality: N/A;  . EYE SURGERY Bilateral   . LEFT HEART CATH AND CORS/GRAFTS ANGIOGRAPHY N/A 01/30/2017   Procedure: Left Heart Cath and Cors/Grafts Angiography and possible PCI;  Surgeon: Marcina MillardAlexander  Paraschos, MD;  Location: [facility] INVASIVE CV LAB;  Service: Cardiovascular;  Laterality: N/A;  . LEFT HEART CATH AND CORS/GRAFTS ANGIOGRAPHY N/A 06/13/2017   Procedure: Left Heart Cath and Cors/Grafts Angiography;  Surgeon: Lamar BlinksKowalski, Bruce J, MD;  Location: [facility] INVASIVE CV LAB;  Service: Cardiovascular;  Laterality: N/A;  . NASAL SEPTUM SURGERY    . TONSILLECTOMY      Social History   Socioeconomic History  . Marital status: Single    Spouse name: Not on file  . Number of children: Not on file  . Years of education: Not on file  . Highest education level: Not on file  Social Needs  . Financial resource strain: Not on file  . Food insecurity - worry: Not on file  . Food insecurity - inability: Not on file  . Transportation needs - medical: Not on file  . Transportation needs - non-medical: Not on file  Occupational History  . Occupation: self employed    Comment: Psychiatristtoe truck driver  Tobacco Use  . Smoking status: Never Smoker  . Smokeless tobacco: Never Used  Substance and Sexual Activity  . Alcohol use: No    Alcohol/week: 0.0 oz    Comment: 1 glass wine/month  . Drug use: No    Comment: hemp oil  . Sexual activity: Not on file  Other Topics Concern  . Not on file  Social History Narrative  . Not on file    Current Outpatient Medications on File Prior to Visit  Medication Sig Dispense Refill  . acetaminophen (TYLENOL) 500  MG tablet Take 500 mg by mouth every 6 (six) hours as needed for mild pain.     Marland Kitchen albuterol (PROVENTIL HFA;VENTOLIN HFA) 108 (90 BASE) MCG/ACT inhaler Inhale 2 puffs into the lungs every 6 (six) hours as needed for wheezing or shortness of breath.    . ALPRAZolam (XANAX) 0.5 MG tablet Take 0.5 mg by mouth 3 (three) times daily. Dr Malvin Johns    . APOKYN 30 MG/3ML SOCT INJECT 0.2ML SUBCUTANEOUSLY AS STARTER DOSE AND TITRATE TO EFFECTIVE DOSE (MAX OF 0.6ML) BASED ON MANUFACTURER GUIDELINES (UP TO 3 TIMES PER 30 Cartridge 1  . ARTIFICIAL TEAR OP Apply 1 drop to  eye as needed (for dry eyes).    Marland Kitchen aspirin EC 81 MG tablet Take 81 mg by mouth daily.    . carbidopa-levodopa (SINEMET CR) 50-200 MG tablet Take 1 tablet by mouth at bedtime. 90 tablet 1  . carbidopa-levodopa (SINEMET IR) 25-100 MG tablet 3 in the morning/2 midmorning/3afternoon/2 midafternoon/2 evening (Patient taking differently: Take 3 tablets by mouth 4 (four) times daily. ) 1080 tablet 1  . carvedilol (COREG) 6.25 MG tablet Take 6.25 mg by mouth 2 (two) times daily.     . chlorhexidine (HIBICLENS) 4 % external liquid Apply topically daily as needed. 120 mL 0  . fluticasone (FLONASE) 50 MCG/ACT nasal spray Place 2 sprays into both nostrils daily. 16 g 6  . glucose blood test strip 1 each by Other route daily. And lancets 1/day 100 each 12  . isosorbide mononitrate (IMDUR) 30 MG 24 hr tablet Take 1 tablet (30 mg total) by mouth daily. 30 tablet 0  . lisinopril (ZESTRIL) 2.5 MG tablet Take 1 tablet (2.5 mg total) by mouth daily. 30 tablet 0  . metFORMIN (GLUCOPHAGE-XR) 500 MG 24 hr tablet Take 1 tablet (500 mg total) by mouth daily with breakfast. 90 tablet 3  . mupirocin ointment (BACTROBAN) 2 % Apply 1 application topically 2 (two) times daily. 22 g 0  . neomycin-polymyxin-hydrocortisone (CORTISPORIN) 3.5-10000-1 OTIC suspension Place 3 drops into both ears 4 (four) times daily. 10 mL 0  . nitroGLYCERIN (NITROSTAT) 0.4 MG SL tablet Place 0.4 mg under the tongue every 5 (five) minutes as needed for chest pain.    . pantoprazole (PROTONIX) 40 MG tablet Take 40 mg by mouth 2 (two) times daily.    . phenol (CHLORASEPTIC) 1.4 % LIQD Use as directed 1 spray in the mouth or throat as needed for throat irritation / pain.    . promethazine (PHENERGAN) 25 MG tablet Take 25 mg by mouth every 8 (eight) hours as needed for nausea or vomiting.    . rosuvastatin (CRESTOR) 20 MG tablet Take 20 mg by mouth at bedtime.    . rotigotine (NEUPRO) 4 MG/24HR Place 1 patch onto the skin daily.    . ticagrelor  (BRILINTA) 90 MG TABS tablet Take 90 mg by mouth every 12 (twelve) hours.    . valACYclovir (VALTREX) 500 MG tablet Take 500 mg by mouth 2 (two) times daily.     No current facility-administered medications on file prior to visit.     Allergies  Allergen Reactions  . Iodinated Diagnostic Agents Nausea Only and Other (See Comments)    Reaction:  Fever   . Sulfa Antibiotics Rash    Family History  Problem Relation Age of Onset  . Breast cancer Mother   . Diabetes Father   . CAD Father   . CVA Father   . Diabetes Brother  BP (!) 98/47 (BP Location: Left Arm, Patient Position: Sitting, Cuff Size: Normal)   Pulse 63   Wt 214 lb 12.8 oz (97.4 kg)   SpO2 97%   BMI 29.96 kg/m    Review of Systems Denies weight change.    Objective:   Physical Exam VITAL SIGNS:  See vs page GENERAL: no distress Pulses: dorsalis pedis intact bilat.   MSK: no deformity of the feet CV: trace bilat leg leg edema Skin:  no ulcer on the feet.  normal color and temp on the feet.  Neuro: sensation is intact to touch on the feet.   Lab Results  Component Value Date   CREATININE 0.90 11/26/2017   BUN 15 11/26/2017   NA 137 11/26/2017   K 3.9 11/26/2017   CL 101 11/26/2017   CO2 29 11/26/2017    Lab Results  Component Value Date   HGBA1C 7.1 01/02/2018      Assessment & Plan:  Type 2 DM, with CAD: worse.  Esophageal yeast infection: tot related to jardiance.  Parkinson's: in this setting, bromocriptine is an option Hypotension: however, this is a relative contraindication to bromocriptine.   Patient Instructions  Please continue the same metformin, and: I have sent a prescription to your pharmacy, to add "januvia." check your blood sugar once a day.  vary the time of day when you check, between before the 3 meals, and at bedtime.  also check if you have symptoms of your blood sugar being too high or too low.  please keep a record of the readings and bring it to your next appointment  here (or you can bring the meter itself).  You can write it on any piece of paper.  please call us sooner if your blood sugar goes below 70, or if you have a lot of readings over 200.  Please come back for a follow-up appointment in 3 months.

## 2018-01-03 NOTE — ED Notes (Signed)

## 2018-01-06 ENCOUNTER — Encounter: Payer: Self-pay | Admitting: *Deleted

## 2018-01-06 ENCOUNTER — Observation Stay
Admission: RE | Admit: 2018-01-06 | Discharge: 2018-01-07 | Disposition: A | Discharge status: Home / Self Care | Payer: BLUE CROSS/BLUE SHIELD | Source: Ambulatory Visit | Attending: Cardiology | Admitting: Cardiology

## 2018-01-06 ENCOUNTER — Other Ambulatory Visit: Payer: Self-pay

## 2018-01-06 ENCOUNTER — Encounter
Admission: RE | Disposition: A | Discharge status: Home / Self Care | Payer: Self-pay | Source: Ambulatory Visit | Attending: Cardiology

## 2018-01-06 DIAGNOSIS — T82855A Stenosis of coronary artery stent, initial encounter: Secondary | ICD-10-CM | POA: Insufficient documentation

## 2018-01-06 DIAGNOSIS — I25810 Atherosclerosis of coronary artery bypass graft(s) without angina pectoris: Secondary | ICD-10-CM | POA: Diagnosis not present

## 2018-01-06 DIAGNOSIS — I2582 Chronic total occlusion of coronary artery: Secondary | ICD-10-CM | POA: Diagnosis not present

## 2018-01-06 DIAGNOSIS — Y831 Surgical operation with implant of artificial internal device as the cause of abnormal reaction of the patient, or of later complication, without mention of misadventure at the time of the procedure: Secondary | ICD-10-CM | POA: Insufficient documentation

## 2018-01-06 DIAGNOSIS — I2510 Atherosclerotic heart disease of native coronary artery without angina pectoris: Secondary | ICD-10-CM | POA: Diagnosis present

## 2018-01-06 DIAGNOSIS — Z882 Allergy status to sulfonamides status: Secondary | ICD-10-CM | POA: Insufficient documentation

## 2018-01-06 DIAGNOSIS — Z91041 Radiographic dye allergy status: Secondary | ICD-10-CM | POA: Insufficient documentation

## 2018-01-06 DIAGNOSIS — R079 Chest pain, unspecified: Secondary | ICD-10-CM

## 2018-01-06 HISTORY — PX: LEFT HEART CATH AND CORS/GRAFTS ANGIOGRAPHY: CATH118250

## 2018-01-06 HISTORY — PX: CORONARY STENT INTERVENTION: CATH118234

## 2018-01-06 LAB — GLUCOSE, CAPILLARY
Glucose-Capillary: 162 mg/dL — ABNORMAL HIGH (ref 65–99)
Glucose-Capillary: 188 mg/dL — ABNORMAL HIGH (ref 65–99)

## 2018-01-06 LAB — POCT ACTIVATED CLOTTING TIME: Activated Clotting Time: 351 seconds

## 2018-01-06 SURGERY — LEFT HEART CATH AND CORS/GRAFTS ANGIOGRAPHY
Anesthesia: Moderate Sedation

## 2018-01-06 MED ORDER — PANTOPRAZOLE SODIUM 40 MG PO TBEC
40.0000 mg | DELAYED_RELEASE_TABLET | Freq: Two times a day (BID) | ORAL | Status: DC
Start: 2018-01-06 — End: 2018-01-07
  Administered 2018-01-06 – 2018-01-07 (×2): 40 mg via ORAL
  Filled 2018-01-06 (×2): qty 1

## 2018-01-06 MED ORDER — BIVALIRUDIN BOLUS VIA INFUSION - CUPID
INTRAVENOUS | Status: DC | PRN
  Administered 2018-01-06: 70.725 mg via INTRAVENOUS

## 2018-01-06 MED ORDER — CARBIDOPA-LEVODOPA 25-100 MG PO TABS
2.0000 | ORAL_TABLET | ORAL | Status: DC
  Administered 2018-01-07: 2 via ORAL
  Filled 2018-01-06: qty 2

## 2018-01-06 MED ORDER — HYDRALAZINE HCL 20 MG/ML IJ SOLN: 5.0000 mg | INTRAMUSCULAR | Status: AC | PRN

## 2018-01-06 MED ORDER — CARBIDOPA-LEVODOPA ER 50-200 MG PO TBCR: 1.0000 | EXTENDED_RELEASE_TABLET | Freq: Every day | ORAL | Status: DC

## 2018-01-06 MED ORDER — ASPIRIN 81 MG PO CHEW: 81.0000 mg | CHEWABLE_TABLET | ORAL | Status: DC

## 2018-01-06 MED ORDER — FENTANYL CITRATE (PF) 100 MCG/2ML IJ SOLN
INTRAMUSCULAR | Status: AC
  Filled 2018-01-06: qty 2

## 2018-01-06 MED ORDER — SODIUM CHLORIDE 0.9% FLUSH: 3.0000 mL | Freq: Two times a day (BID) | INTRAVENOUS | Status: DC

## 2018-01-06 MED ORDER — ONDANSETRON HCL 4 MG/2ML IJ SOLN: 4.0000 mg | Freq: Four times a day (QID) | INTRAMUSCULAR | Status: DC | PRN

## 2018-01-06 MED ORDER — SODIUM CHLORIDE 0.9 % IV SOLN: 250.0000 mL | INTRAVENOUS | Status: DC | PRN

## 2018-01-06 MED ORDER — LISINOPRIL 5 MG PO TABS
2.5000 mg | ORAL_TABLET | Freq: Every day | ORAL | Status: DC
  Administered 2018-01-07: 2.5 mg via ORAL
  Filled 2018-01-06 (×2): qty 1

## 2018-01-06 MED ORDER — SODIUM CHLORIDE 0.9 % WEIGHT BASED INFUSION
3.0000 mL/kg/h | INTRAVENOUS | Status: AC
  Administered 2018-01-06: 3 mL/kg/h via INTRAVENOUS

## 2018-01-06 MED ORDER — ROSUVASTATIN CALCIUM 20 MG PO TABS
20.0000 mg | ORAL_TABLET | Freq: Every day | ORAL | Status: DC
  Administered 2018-01-06: 20 mg via ORAL
  Filled 2018-01-06: qty 2
  Filled 2018-01-06: qty 1

## 2018-01-06 MED ORDER — FLUTICASONE PROPIONATE 50 MCG/ACT NA SUSP
2.0000 | Freq: Every day | NASAL | Status: DC
Start: 2018-01-06 — End: 2018-01-07
  Administered 2018-01-07: 2 via NASAL
  Filled 2018-01-06: qty 16

## 2018-01-06 MED ORDER — SODIUM CHLORIDE 0.9% FLUSH: 3.0000 mL | INTRAVENOUS | Status: DC | PRN

## 2018-01-06 MED ORDER — LABETALOL HCL 5 MG/ML IV SOLN: 10.0000 mg | INTRAVENOUS | Status: AC | PRN

## 2018-01-06 MED ORDER — CARBIDOPA-LEVODOPA 25-100 MG PO TABS
2.0000 | ORAL_TABLET | ORAL | Status: DC
  Filled 2018-01-06: qty 2

## 2018-01-06 MED ORDER — MIDAZOLAM HCL 2 MG/2ML IJ SOLN
INTRAMUSCULAR | Status: DC | PRN
  Administered 2018-01-06: 1 mg via INTRAVENOUS

## 2018-01-06 MED ORDER — CARBIDOPA-LEVODOPA 25-100 MG PO TABS
3.0000 | ORAL_TABLET | ORAL | Status: DC
  Filled 2018-01-06: qty 3

## 2018-01-06 MED ORDER — GI COCKTAIL ~~LOC~~
30.0000 mL | Freq: Three times a day (TID) | ORAL | Status: DC | PRN
  Filled 2018-01-06: qty 30

## 2018-01-06 MED ORDER — IOPAMIDOL (ISOVUE-300) INJECTION 61%
INTRAVENOUS | Status: DC | PRN
  Administered 2018-01-06: 250 mL via INTRA_ARTERIAL

## 2018-01-06 MED ORDER — FAMOTIDINE 20 MG PO TABS: 20.0000 mg | ORAL_TABLET | Freq: Once | ORAL | Status: DC

## 2018-01-06 MED ORDER — CARBIDOPA-LEVODOPA ER 50-200 MG PO TBCR: 1.0000 | EXTENDED_RELEASE_TABLET | Freq: Two times a day (BID) | ORAL | Status: DC

## 2018-01-06 MED ORDER — SODIUM CHLORIDE 0.9% FLUSH
3.0000 mL | Freq: Two times a day (BID) | INTRAVENOUS | Status: DC
  Administered 2018-01-06 – 2018-01-07 (×3): 3 mL via INTRAVENOUS

## 2018-01-06 MED ORDER — NITROGLYCERIN 5 MG/ML IV SOLN
INTRAVENOUS | Status: AC
  Filled 2018-01-06: qty 10

## 2018-01-06 MED ORDER — SODIUM CHLORIDE 0.9 % IV SOLN
INTRAVENOUS | Status: DC | PRN
  Administered 2018-01-06: 1.75 mg/kg/h via INTRAVENOUS

## 2018-01-06 MED ORDER — ISOSORBIDE MONONITRATE ER 60 MG PO TB24
120.0000 mg | ORAL_TABLET | Freq: Every day | ORAL | Status: DC
  Administered 2018-01-07: 120 mg via ORAL
  Filled 2018-01-06 (×2): qty 2

## 2018-01-06 MED ORDER — BIVALIRUDIN TRIFLUOROACETATE 250 MG IV SOLR
INTRAVENOUS | Status: AC
  Filled 2018-01-06: qty 250

## 2018-01-06 MED ORDER — ACETAMINOPHEN 325 MG PO TABS: 650.0000 mg | ORAL_TABLET | ORAL | Status: DC | PRN

## 2018-01-06 MED ORDER — CARVEDILOL 6.25 MG PO TABS
6.2500 mg | ORAL_TABLET | Freq: Two times a day (BID) | ORAL | Status: DC
  Administered 2018-01-06 – 2018-01-07 (×2): 6.25 mg via ORAL
  Filled 2018-01-06 (×2): qty 1

## 2018-01-06 MED ORDER — TICAGRELOR 90 MG PO TABS
ORAL_TABLET | ORAL | Status: AC
  Filled 2018-01-06: qty 1

## 2018-01-06 MED ORDER — METHYLPREDNISOLONE SODIUM SUCC 125 MG IJ SOLR: 125.0000 mg | Freq: Once | INTRAMUSCULAR | Status: DC

## 2018-01-06 MED ORDER — METHYLPREDNISOLONE SODIUM SUCC 125 MG IJ SOLR
INTRAMUSCULAR | Status: AC
  Administered 2018-01-06: 12:00:00
  Filled 2018-01-06: qty 2

## 2018-01-06 MED ORDER — FAMOTIDINE 20 MG PO TABS
ORAL_TABLET | ORAL | Status: AC
  Administered 2018-01-06: 12:00:00
  Filled 2018-01-06: qty 1

## 2018-01-06 MED ORDER — HEPARIN (PORCINE) IN NACL 2-0.9 UNIT/ML-% IJ SOLN
INTRAMUSCULAR | Status: AC
  Filled 2018-01-06: qty 1000

## 2018-01-06 MED ORDER — CARBIDOPA-LEVODOPA 25-100 MG PO TABS
2.0000 | ORAL_TABLET | ORAL | Status: DC
  Administered 2018-01-06: 2 via ORAL
  Filled 2018-01-06: qty 2

## 2018-01-06 MED ORDER — FENTANYL CITRATE (PF) 100 MCG/2ML IJ SOLN
INTRAMUSCULAR | Status: DC | PRN
  Administered 2018-01-06: 25 ug via INTRAVENOUS

## 2018-01-06 MED ORDER — ALPRAZOLAM 0.5 MG PO TABS
0.5000 mg | ORAL_TABLET | Freq: Two times a day (BID) | ORAL | Status: DC
  Administered 2018-01-06 – 2018-01-07 (×3): 0.5 mg via ORAL
  Filled 2018-01-06 (×3): qty 1

## 2018-01-06 MED ORDER — TICAGRELOR 90 MG PO TABS
90.0000 mg | ORAL_TABLET | Freq: Two times a day (BID) | ORAL | Status: DC
  Administered 2018-01-07: 90 mg via ORAL
  Filled 2018-01-06 (×3): qty 1

## 2018-01-06 MED ORDER — MIDAZOLAM HCL 2 MG/2ML IJ SOLN
INTRAMUSCULAR | Status: AC
  Filled 2018-01-06: qty 2

## 2018-01-06 MED ORDER — SODIUM CHLORIDE 0.9 % WEIGHT BASED INFUSION: 1.0000 mL/kg/h | INTRAVENOUS | Status: DC

## 2018-01-06 MED ORDER — ASPIRIN 81 MG PO CHEW
81.0000 mg | CHEWABLE_TABLET | Freq: Every day | ORAL | Status: DC
  Administered 2018-01-07: 81 mg via ORAL
  Filled 2018-01-06: qty 1

## 2018-01-06 MED ORDER — CARBIDOPA-LEVODOPA 25-100 MG PO TABS
3.0000 | ORAL_TABLET | ORAL | Status: DC
  Administered 2018-01-07: 3 via ORAL
  Filled 2018-01-06: qty 3

## 2018-01-06 MED ORDER — CARBIDOPA-LEVODOPA 25-100 MG PO TABS: 1.0000 | ORAL_TABLET | Freq: Three times a day (TID) | ORAL | Status: DC

## 2018-01-06 MED ORDER — SODIUM CHLORIDE 0.9 % WEIGHT BASED INFUSION: 1.0000 mL/kg/h | INTRAVENOUS | Status: AC

## 2018-01-06 SURGICAL SUPPLY — 20 items
BALLN TREK RX 3.0X12 (BALLOONS) ×2
BALLOON TREK RX 3.0X12 (BALLOONS) ×1 IMPLANT
CATH 5FR JR4 DIAGNOSTIC (CATHETERS) ×2 IMPLANT
CATH INFINITI 5 FR IM (CATHETERS) ×2 IMPLANT
CATH INFINITI 5FR ANG PIGTAIL (CATHETERS) ×2 IMPLANT
CATH INFINITI 5FR JL4 (CATHETERS) ×2 IMPLANT
CATH VISTA GUIDE 6FR JR4 (CATHETERS) ×2 IMPLANT
DEVICE CLOSURE MYNXGRIP 6/7F (Vascular Products) ×2 IMPLANT
DEVICE INFLAT 30 PLUS (MISCELLANEOUS) ×2 IMPLANT
DEVICE SAFEGUARD 24CM (GAUZE/BANDAGES/DRESSINGS) ×2 IMPLANT
KIT MANI 3VAL PERCEP (MISCELLANEOUS) ×2 IMPLANT
NEEDLE PERC 18GX7CM (NEEDLE) ×2 IMPLANT
PACK CARDIAC CATH (CUSTOM PROCEDURE TRAY) ×2 IMPLANT
SHEATH AVANTI 5FR X 11CM (SHEATH) ×2 IMPLANT
SHEATH AVANTI 6FR X 11CM (SHEATH) ×2 IMPLANT
STENT SIERRA 3.00 X 15 MM (Permanent Stent) ×2 IMPLANT
WIRE ASAHI PROWATER 180CM (WIRE) ×2 IMPLANT
WIRE EMERALD 3MM-J .035X260CM (WIRE) ×2 IMPLANT
WIRE G HI TQ BMW 190 (WIRE) ×2 IMPLANT
WIRE GUIDERIGHT .035X150 (WIRE) ×2 IMPLANT

## 2018-01-06 NOTE — Progress Notes (Signed)
Pt admitted from special procedures s/p cardiac cath with stent placement. PAD in place with very small amount of oozing noted under device. RN will continue to monitor. VSS, no complaints of pain, SR on telemetry.

## 2018-01-06 NOTE — Progress Notes (Signed)
Specials RN Clydie BraunKaren, reports 500cc air in PAD unpon admission to this unit. 10cc removed at 1900. Night RN will slowly remove air after patient oozed blood post procedure.

## 2018-01-07 DIAGNOSIS — I25810 Atherosclerosis of coronary artery bypass graft(s) without angina pectoris: Secondary | ICD-10-CM | POA: Diagnosis not present

## 2018-01-07 LAB — BASIC METABOLIC PANEL
Anion gap: 8 (ref 5–15)
BUN: 16 mg/dL (ref 6–20)
CALCIUM: 9.1 mg/dL (ref 8.9–10.3)
CHLORIDE: 105 mmol/L (ref 101–111)
CO2: 24 mmol/L (ref 22–32)
CREATININE: 0.88 mg/dL (ref 0.61–1.24)
GFR calc non Af Amer: >60 mL/min (ref >60)
Glucose, Bld: 220 mg/dL — ABNORMAL HIGH (ref 65–99)
Potassium: 4 mmol/L (ref 3.5–5.1)
SODIUM: 137 mmol/L (ref 135–145)

## 2018-01-07 LAB — CBC
HCT: 41 % (ref 40.0–52.0)
Hemoglobin: 14 g/dL (ref 13.0–18.0)
MCH: 31.5 pg (ref 26.0–34.0)
MCHC: 34.2 g/dL (ref 32.0–36.0)
MCV: 91.9 fL (ref 80.0–100.0)
PLATELETS: 152 10*3/uL (ref 150–440)
RBC: 4.46 MIL/uL (ref 4.40–5.90)
RDW: 14.3 % (ref 11.5–14.5)
WBC: 8.2 10*3/uL (ref 3.8–10.6)

## 2018-01-07 NOTE — Progress Notes (Signed)
62 year old male with recurrent chest pain with known hx of CAD, S/P CABG x 3, LIMA to LAD, SVG to PDA and OM 2 on 06/18/2007 and multiple coronary stents.  Patient underwent cardiac cath yesterday 01/06/2018 which revealed normal left ventricular function, 3-vessel CAD, with occluded proximal LAD, occluded proximal RCA, patent stents OM1 and mid Left Circumflex, patent LIMA to LAD, occluded SVG to OM1, patent SVG to PDA with 99% in-stent restenosis distal SVG prior to the anastomotic site. Successful PCI was performed with stent to in-stent restenosis distal SVG to PDA.  Patient has hx of anxiety, CAD, HLD, HTN, Parkinson's disease, pre-diabetes.  Patient is a NEVER smoker.    Rounded on patient to speak with patient about Cardiac Rehab.  Patient reports he has participated in Cardiac Rehab in the past.  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  Also, he is always busier this time of year.  Cardiac Rehab brochure with department phone number provided to patient should he want to participate in Cardiac Rehab in the future.   Patient has a good understanding of CAD and associated risk factors.  Patient is familiar with stent cards and rationale for these cards.  Patient is compliant with medications as prescribed.  Encouraged patient to follow low fat, low cholesterol, low sodium heart healthy diet and to exercise - stay active as much as possible.    Patient thanked me for stopping in to see him.   Army Meliaiane Makell Drohan, RN, BSN, [facility] Cardiovascular and Pulmonary Nurse Navigator

## 2018-01-07 NOTE — Plan of Care (Signed)
  Progressing Education: Knowledge of General Education information will improve 01/07/2018 0201 - Progressing by Kalman JewelsBallentine, Lakechia Nay, RN Health Behavior/Discharge Planning: Ability to manage health-related needs will improve 01/07/2018 0201 - Progressing by Kalman JewelsBallentine, Jhovani Griswold, RN Clinical Measurements: Ability to maintain clinical measurements within normal limits will improve 01/07/2018 0201 - Progressing by Kalman JewelsBallentine, Siyana Erney, RN Will remain free from infection 01/07/2018 0201 - Progressing by Kalman JewelsBallentine, Teonia Yager, RN Diagnostic test results will improve 01/07/2018 0201 - Progressing by Kalman JewelsBallentine, Melisa Donofrio, RN Respiratory complications will improve 01/07/2018 0201 - Progressing by Kalman JewelsBallentine, Kaylia Winborne, RN Cardiovascular complication will be avoided 01/07/2018 0201 - Progressing by Kalman JewelsBallentine, Dimarco Minkin, RN Activity: Risk for activity intolerance will decrease 01/07/2018 0201 - Progressing by Kalman JewelsBallentine, Bralon Antkowiak, RN Nutrition: Adequate nutrition will be maintained 01/07/2018 0201 - Progressing by Kalman JewelsBallentine, Rhone Ozaki, RN Coping: Level of anxiety will decrease 01/07/2018 0201 - Progressing by Kalman JewelsBallentine, Basel Defalco, RN Elimination: Will not experience complications related to bowel motility 01/07/2018 0201 - Progressing by Kalman JewelsBallentine, Saeed Toren, RN Will not experience complications related to urinary retention 01/07/2018 0201 - Progressing by Kalman JewelsBallentine, Nikita Surman, RN Pain Managment: General experience of comfort will improve 01/07/2018 0201 - Progressing by Kalman JewelsBallentine, Zebulun Deman, RN Safety: Ability to remain free from injury will improve 01/07/2018 0201 - Progressing by Kalman JewelsBallentine, Qamar Aughenbaugh, RN Skin Integrity: Risk for impaired skin integrity will decrease 01/07/2018 0201 - Progressing by Kalman JewelsBallentine, Rajni Holsworth, RN

## 2018-01-07 NOTE — Discharge Summary (Signed)
Physician Discharge Summary  Patient ID: Sean EdisonRobert M Downard MRN: [id number] DOB/AGE: 63/08/1956 63 y.o.  Admit date: 01/06/2018 Discharge date: 01/07/2018  Primary Discharge Diagnosis Coronary artery disease, Chest pain Secondary Discharge Diagnosis same  Significant Diagnostic Studies: Coronary angiography: 99% in-stent restenosis distal SVG to PDA  Consults: cardiology  Hospital Course: 62 year old male with recurrent chest pain with known history of coronary artery disease, status post CABG x3, LIMA to LAD, SVG to PDA and OM 2 06/18/2007 and multiple coronary stents. The patient successfully underwent coronary angiography on 01/06/2018, which revealed normal left ventricular function, three-vessel coronary artery disease, with occluded proximal LAD, occluded proximal LAD, occluded proximal RCA, patent stents OM1 and mid left circumflex, patent LIMA to LAD, occluded SVG to OM1, patent SVG to PDA with 99% in-stent restenosis distal SVG prior to the anastomotic site. Successful PCI was performed with a 3.0 x 15 mm Xience Sierra stent to in-stent restenosis distal SVG to PDA. This morning, the reports feeling much better. He denies recurrence of chest pain.    Discharge Exam: Blood pressure (!) 142/78, pulse (!) 57, temperature 97.7 F (36.5 C), temperature source Oral, resp. rate 20, height 5\' 10"  (1.778 m), weight 94.3 kg (208 lb), SpO2 98 %.   General appearance: alert, cooperative and no distress Head: Normocephalic, without obvious abnormality Resp: Normal effort of breathing, no accessory respiratory muscle use, no wheezing Cardio: regular rate and rhythm, S1, S2 normal, no murmur, click, rub or gallop Extremities: extremities normal, atraumatic, no cyanosis or edema Skin: Warm, dry, no diaphoresis Labs:   Lab Results  Component Value Date   WBC 8.2 01/07/2018   HGB 14.0 01/07/2018   HCT 41.0 01/07/2018   MCV 91.9 01/07/2018   PLT 152 01/07/2018    Recent Labs  Lab  01/07/18 0559  NA 137  K 4.0  CL 105  CO2 24  BUN 16  CREATININE 0.88  CALCIUM 9.1  GLUCOSE 220*    EKG: Sinus rhythm  FOLLOW UP PLANS AND APPOINTMENTS  Allergies as of 01/07/2018      Reactions   Iodinated Diagnostic Agents Nausea Only, Other (See Comments)   Reaction:  Fever    Sulfa Antibiotics Rash      Medication List    TAKE these medications   acetaminophen 500 MG tablet Commonly known as:  TYLENOL Take 500 mg by mouth every 6 (six) hours as needed for mild pain.   albuterol 108 (90 Base) MCG/ACT inhaler Commonly known as:  PROVENTIL HFA;VENTOLIN HFA Inhale 2 puffs into the lungs every 6 (six) hours as needed for wheezing or shortness of breath.   ALPRAZolam 0.5 MG tablet Commonly known as:  XANAX Take 0.25-0.5 mg by mouth 2 (two) times daily. Dr Malvin JohnsPotter   APOKYN 30 MG/3ML Soct Generic drug:  Apomorphine HCl INJECT 0.2ML SUBCUTANEOUSLY AS STARTER DOSE AND TITRATE TO EFFECTIVE DOSE (MAX OF 0.6ML) BASED ON MANUFACTURER GUIDELINES (UP TO 3 TIMES PER   ARTIFICIAL TEAR OP Apply 1 drop to eye as needed (for dry eyes).   aspirin EC 81 MG tablet Take 81 mg by mouth daily.   carbidopa-levodopa 50-200 MG tablet Commonly known as:  SINEMET CR Take 1 tablet by mouth at bedtime. What changed:  Another medication with the same name was changed. Make sure you understand how and when to take each.   carbidopa-levodopa 25-100 MG tablet Commonly known as:  SINEMET IR 3 in the morning/2 midmorning/3afternoon/2 midafternoon/2 evening What changed:    how much to  take  how to take this  when to take this  additional instructions   carvedilol 6.25 MG tablet Commonly known as:  COREG Take 6.25 mg by mouth 2 (two) times daily.   chlorhexidine 4 % external liquid Commonly known as:  HIBICLENS Apply topically daily as needed.   fluticasone 50 MCG/ACT nasal spray Commonly known as:  FLONASE Place 2 sprays into both nostrils daily.   glucose blood test strip 1  each by Other route daily. And lancets 1/day   isosorbide mononitrate 30 MG 24 hr tablet Commonly known as:  IMDUR Take 1 tablet (30 mg total) by mouth daily. What changed:  how much to take   lisinopril 2.5 MG tablet Commonly known as:  ZESTRIL Take 1 tablet (2.5 mg total) by mouth daily.   metFORMIN 500 MG 24 hr tablet Commonly known as:  GLUCOPHAGE-XR Take 1 tablet (500 mg total) by mouth daily with breakfast.   mupirocin ointment 2 % Commonly known as:  BACTROBAN Apply 1 application topically 2 (two) times daily.   neomycin-polymyxin-hydrocortisone 3.5-10000-1 OTIC suspension Commonly known as:  CORTISPORIN Place 3 drops into both ears 4 (four) times daily.   nitroGLYCERIN 0.4 MG SL tablet Commonly known as:  NITROSTAT Place 0.4 mg under the tongue every 5 (five) minutes as needed for chest pain.   pantoprazole 40 MG tablet Commonly known as:  PROTONIX Take 40 mg by mouth 2 (two) times daily.   phenol 1.4 % Liqd Commonly known as:  CHLORASEPTIC Use as directed 1 spray in the mouth or throat as needed for throat irritation / pain.   pramipexole 0.125 MG tablet Commonly known as:  MIRAPEX Take 0.125-0.5 mg by mouth 3 (three) times daily.   promethazine 25 MG tablet Commonly known as:  PHENERGAN Take 25 mg by mouth every 8 (eight) hours as needed for nausea or vomiting.   rosuvastatin 20 MG tablet Commonly known as:  CRESTOR Take 20 mg by mouth at bedtime.   rotigotine 4 MG/24HR Commonly known as:  NEUPRO Place 1 patch onto the skin daily.   sitaGLIPtin 50 MG tablet Commonly known as:  JANUVIA Take 1 tablet (50 mg total) by mouth daily.   ticagrelor 90 MG Tabs tablet Commonly known as:  BRILINTA Take 90 mg by mouth every 12 (twelve) hours.   VALTREX 500 MG tablet Generic drug:  valACYclovir Take 500 mg by mouth 2 (two) times daily.      Follow-up Information    Paraschos, Alexander, MD. Go in 1 week(s).   Specialty:  Cardiology Contact  information: [address] [facility] [place] [address] [phone]           BRING ALL MEDICATIONS WITH YOU TO FOLLOW UP APPOINTMENTS  Time spent with patient to include physician time: 15 minutes Signed:  Leanora Ivanoff PA-C 01/07/2018, 8:11 AM

## 2018-01-07 NOTE — Progress Notes (Signed)
RN assisted NS with removal of PAD. All air was deflated on previous shift. PAD removed without incident. Pt tolerated procedure. 2x2 and occlusive dressing applied. No hematoma nor bleeding noted from site. Pt educated on femoral site care. I will continue to assess.

## 2018-01-08 ENCOUNTER — Telehealth: Payer: Self-pay

## 2018-01-08 NOTE — Telephone Encounter (Signed)
Discharge summary reviewed and discussed with Dr. Yetta BarreJones. Because pt underwent cardiac cath by Dr. Cassie FreerParachos for ongoing c/o chest pain, Dr. Yetta BarreJones states it is unnecessary to see this pt for a hosp f/u appt. Care for HTN and CAD will be assumed by Dr. Cassie FreerParachos. In light of our conversation, a TCM call was not placed.

## 2018-01-10 ENCOUNTER — Other Ambulatory Visit: Payer: Self-pay | Admitting: Neurology

## 2018-01-14 ENCOUNTER — Encounter: Payer: Self-pay | Admitting: Emergency Medicine

## 2018-01-14 ENCOUNTER — Other Ambulatory Visit: Payer: Self-pay

## 2018-01-14 ENCOUNTER — Emergency Department: Payer: BLUE CROSS/BLUE SHIELD

## 2018-01-14 ENCOUNTER — Emergency Department
Admission: EM | Admit: 2018-01-14 | Discharge: 2018-01-14 | Disposition: A | Discharge status: Home / Self Care | Payer: BLUE CROSS/BLUE SHIELD | Attending: Emergency Medicine | Admitting: Emergency Medicine

## 2018-01-14 DIAGNOSIS — L7632 Postprocedural hematoma of skin and subcutaneous tissue following other procedure: Secondary | ICD-10-CM | POA: Insufficient documentation

## 2018-01-14 DIAGNOSIS — Z955 Presence of coronary angioplasty implant and graft: Secondary | ICD-10-CM | POA: Insufficient documentation

## 2018-01-14 DIAGNOSIS — N50811 Right testicular pain: Secondary | ICD-10-CM

## 2018-01-14 DIAGNOSIS — I2510 Atherosclerotic heart disease of native coronary artery without angina pectoris: Secondary | ICD-10-CM | POA: Insufficient documentation

## 2018-01-14 DIAGNOSIS — I10 Essential (primary) hypertension: Secondary | ICD-10-CM | POA: Insufficient documentation

## 2018-01-14 DIAGNOSIS — E119 Type 2 diabetes mellitus without complications: Secondary | ICD-10-CM | POA: Insufficient documentation

## 2018-01-14 DIAGNOSIS — G8918 Other acute postprocedural pain: Secondary | ICD-10-CM

## 2018-01-14 DIAGNOSIS — G20 Parkinson's disease: Secondary | ICD-10-CM | POA: Diagnosis not present

## 2018-01-14 DIAGNOSIS — Z951 Presence of aortocoronary bypass graft: Secondary | ICD-10-CM | POA: Diagnosis not present

## 2018-01-14 MED ORDER — OXYCODONE-ACETAMINOPHEN 5-325 MG PO TABS
1.0000 | ORAL_TABLET | ORAL | Status: DC | PRN
  Administered 2018-01-14: 1 via ORAL

## 2018-01-14 MED ORDER — OXYCODONE-ACETAMINOPHEN 5-325 MG PO TABS
ORAL_TABLET | ORAL | Status: AC
  Filled 2018-01-14: qty 1

## 2018-01-14 NOTE — ED Provider Notes (Addendum)
[facility] Emergency Department Provider Note       Time seen: ----------------------------------------- 5:27 PM on 01/14/2018 -----------------------------------------   I have reviewed the triage vital signs and the nursing notes.  HISTORY   Chief Complaint Post-op Problem    HPI Sean Barajas is a 62 y.o. male with a history of anxiety, coronary artery disease, hyperlipidemia, hypertension, Parkinson's disease who presents to the ED for sudden onset of pain to the right groin and right testicle.  Patient denies any swelling at this time.  He reports a cardiac cath on Friday and reports his dog ran into him in that area recently.  He denies chills or other complaints.  Pain is 7 out of 10 in the right groin  Past Medical History:  Diagnosis Date  . Anxiety   . Coronary artery disease   . Hyperlipidemia   . Hypertension   . Parkinson's disease (HCC)   . Prediabetes     Patient Active Problem List   Diagnosis Date Noted  . Dysphagia   . Esophageal stricture   . Chest pain 07/31/2017  . Diabetes (HCC) 06/26/2017  . CAD (coronary artery disease) 01/30/2017  . Unstable angina (HCC) 01/29/2017  . Aortic atherosclerosis (HCC) 08/28/2016    Past Surgical History:  Procedure Laterality Date  . APPENDECTOMY    . BALLOON DILATION N/A 11/01/2017   Procedure: BALLOON DILATION;  Surgeon: Scot Jun, MD;  Location: [facility] ENDOSCOPY;  Service: Endoscopy;  Laterality: N/A;  . CARDIAC CATHETERIZATION    . CAROTID STENT  07/2015  . CHOLECYSTECTOMY    . CORONARY ANGIOPLASTY    . CORONARY ARTERY BYPASS GRAFT    . CORONARY STENT INTERVENTION N/A 01/30/2017   Procedure: Coronary Stent Intervention;  Surgeon: Marcina Millard, MD;  Location: [facility] INVASIVE CV LAB;  Service: Cardiovascular;  Laterality: N/A;  . CORONARY STENT INTERVENTION N/A 06/13/2017   Procedure: Coronary Stent Intervention;  Surgeon: Iran Ouch, MD;  Location: [facility] INVASIVE  CV LAB;  Service: Cardiovascular;  Laterality: N/A;  . CORONARY STENT INTERVENTION N/A 01/06/2018   Procedure: CORONARY STENT INTERVENTION;  Surgeon: Marcina Millard, MD;  Location: [facility] INVASIVE CV LAB;  Service: Cardiovascular;  Laterality: N/A;  . ESOPHAGOGASTRODUODENOSCOPY (EGD) WITH PROPOFOL N/A 11/01/2017   Procedure: ESOPHAGOGASTRODUODENOSCOPY (EGD) WITH PROPOFOL;  Surgeon: Scot Jun, MD;  Location: [facility] ENDOSCOPY;  Service: Endoscopy;  Laterality: N/A;  . EYE SURGERY Bilateral   . LEFT HEART CATH AND CORS/GRAFTS ANGIOGRAPHY N/A 01/30/2017   Procedure: Left Heart Cath and Cors/Grafts Angiography and possible PCI;  Surgeon: Marcina Millard, MD;  Location: [facility] INVASIVE CV LAB;  Service: Cardiovascular;  Laterality: N/A;  . LEFT HEART CATH AND CORS/GRAFTS ANGIOGRAPHY N/A 06/13/2017   Procedure: Left Heart Cath and Cors/Grafts Angiography;  Surgeon: Lamar Blinks, MD;  Location: [facility] INVASIVE CV LAB;  Service: Cardiovascular;  Laterality: N/A;  . LEFT HEART CATH AND CORS/GRAFTS ANGIOGRAPHY N/A 01/06/2018   Procedure: LEFT HEART CATH AND CORS/GRAFTS ANGIOGRAPHY;  Surgeon: Marcina Millard, MD;  Location: [facility] INVASIVE CV LAB;  Service: Cardiovascular;  Laterality: N/A;  . NASAL SEPTUM SURGERY    . TONSILLECTOMY      Allergies Iodinated diagnostic agents and Sulfa antibiotics  Social History Social History   Tobacco Use  . Smoking status: Never Smoker  . Smokeless tobacco: Never Used  Substance Use Topics  . Alcohol use: No    Alcohol/week: 0.0 oz    Comment: 1 glass wine/month  . Drug use: No  Comment: hemp oil    Review of Systems Constitutional: Negative for fever. Cardiovascular: Negative for chest pain. Respiratory: Negative for shortness of breath. Gastrointestinal: Negative for abdominal pain, vomiting and diarrhea. Genitourinary: Positive for groin and right testicular pain Musculoskeletal: Negative for back pain. Skin: Negative for  rash. Neurological: Negative for headaches, focal weakness or numbness.  All systems negative/normal/unremarkable except as stated in the HPI  ____________________________________________   PHYSICAL EXAM:  VITAL SIGNS: ED Triage Vitals  Enc Vitals Group     BP 01/14/18 1543 131/82     Pulse Rate 01/14/18 1543 71     Resp 01/14/18 1543 16     Temp 01/14/18 1543 98.2 F (36.8 C)     Temp Source 01/14/18 1543 Oral     SpO2 01/14/18 1543 97 %     Weight 01/14/18 1544 208 lb (94.3 kg)     Height 01/14/18 1544 5\' 11"  (1.803 m)     Head Circumference --      Peak Flow --      Pain Score 01/14/18 1543 7     Pain Loc --      Pain Edu? --      Excl. in GC? --     Constitutional: Alert and oriented. Well appearing and in no distress. Cardiovascular: Normal rate, regular rhythm. No murmurs, rubs, or gallops. Respiratory: Normal respiratory effort without tachypnea nor retractions. Breath sounds are clear and equal bilaterally. No wheezes/rales/rhonchi. Gastrointestinal: Soft and nontender. Normal bowel sounds Genitourinary: No obvious testicular swelling is appreciated, no obvious hernia, small right inguinal hematoma is noted.  Normal femoral arterial pulsations are appreciated in the right groin.   Musculoskeletal: Nontender with normal range of motion in extremities. Neurologic:  Normal speech and language. No gross focal neurologic deficits are appreciated.  Skin: Small right groin hematoma is noted Psychiatric: Mood and affect are normal. Speech and behavior are normal.  ____________________________________________  ED COURSE:  As part of my medical decision making, I reviewed the following data within the electronic MEDICAL RECORD NUMBER History obtained from family if available, nursing notes, old chart and ekg, as well as notes from prior ED visits. Patient presented for right-sided groin and testicular pain, we will assess with imaging as indicated at this time.    Procedures ____________________________________________   RADIOLOGY Ultrasound scrotum, right lower extremity  IMPRESSION: 1. Nonspecific right side extra-testicular complex fluid collection. I favor this is a hematoma in this clinical setting. 2. Negative for testicular abnormality or torsion.  ____________________________________________  DIFFERENTIAL DIAGNOSIS   Postop hematoma, contusion, orchitis, hernia, pseudoaneurysm  FINAL ASSESSMENT AND PLAN  Postoperative hematoma   Plan: Patient had presented for right-sided testicular pain and groin swelling. Patient's imaging not reveal any acute process other than hematoma.  He does not have evidence to suggest pseudoaneurysm.  This appears stable and he is cleared for outpatient follow-up.   Ulice DashJohnathan E Winner Valeriano, MD   Note: This note was generated in part or whole with voice recognition software. Voice recognition is usually quite accurate but there are transcription errors that can and very often do occur. I apologize for any typographical errors that were not detected and corrected.     Emily FilbertWilliams, Yida Hyams E, MD 01/14/18 1730    Emily FilbertWilliams, Cicilia Clinger E, MD 01/14/18 [phone]

## 2018-01-14 NOTE — ED Notes (Signed)
Pt presentation discussed with Dr. Darnelle CatalanMalinda; see new orders.

## 2018-01-14 NOTE — ED Triage Notes (Addendum)
Pt in via POV with complaints of sudden onset of pain to right groin and right testicle, pt denies any swelling at this time.  Pt reports cardiac cath on Friday, going in through right groin.  Vitals WDL.  NAD noted at this time.

## 2018-01-14 NOTE — ED Notes (Signed)
FIRST NURSE NOTE: pt c/o right groin pain, states he had a cardiac cath with stent placement last Monday and is c/o right groin and testicle pain since his dog jumped on him today.

## 2018-01-17 MED ORDER — ACETAMINOPHEN 325 MG PO TABS
650.00 mg | ORAL_TABLET | ORAL | Status: DC
Start: ? — End: 2018-01-17

## 2018-01-17 MED ORDER — POLYMYXIN B-TRIMETHOPRIM 10000-0.1 UNIT/ML-% OP SOLN
1.00 | OPHTHALMIC | Status: DC
Start: 2018-01-17 — End: 2018-01-17

## 2018-01-17 MED ORDER — LEVOFLOXACIN 500 MG PO TABS
500.00 mg | ORAL_TABLET | ORAL | Status: DC
Start: 2018-01-17 — End: 2018-01-17

## 2018-01-17 MED ORDER — ASPIRIN 81 MG PO CHEW
81.00 mg | CHEWABLE_TABLET | ORAL | Status: DC
Start: 2018-01-18 — End: 2018-01-17

## 2018-01-17 MED ORDER — TICAGRELOR 90 MG PO TABS
90.00 mg | ORAL_TABLET | ORAL | Status: DC
Start: 2018-01-17 — End: 2018-01-17

## 2018-01-17 MED ORDER — SENNOSIDES 8.6 MG PO TABS
2.00 | ORAL_TABLET | ORAL | Status: DC
Start: ? — End: 2018-01-17

## 2018-01-17 MED ORDER — ONDANSETRON 4 MG PO TBDP
4.00 mg | ORAL_TABLET | ORAL | Status: DC
Start: ? — End: 2018-01-17

## 2018-01-17 MED ORDER — BISACODYL 5 MG PO TBEC
10.00 mg | DELAYED_RELEASE_TABLET | ORAL | Status: DC
Start: ? — End: 2018-01-17

## 2018-01-17 MED ORDER — GENERIC EXTERNAL MEDICATION
2.00 | Status: DC
Start: ? — End: 2018-01-17

## 2018-01-17 MED ORDER — CARBIDOPA-LEVODOPA 25-100 MG PO TABS
3.00 | ORAL_TABLET | ORAL | Status: DC
Start: 2018-01-17 — End: 2018-01-17

## 2018-01-17 MED ORDER — TRAZODONE HCL 50 MG PO TABS
50.00 mg | ORAL_TABLET | ORAL | Status: DC
Start: ? — End: 2018-01-17

## 2018-01-17 MED ORDER — GENERIC EXTERNAL MEDICATION
Status: DC
Start: ? — End: 2018-01-17

## 2018-01-17 MED ORDER — ISOSORBIDE MONONITRATE ER 30 MG PO TB24
60.00 mg | ORAL_TABLET | ORAL | Status: DC
Start: 2018-01-18 — End: 2018-01-17

## 2018-01-17 MED ORDER — CARBIDOPA-LEVODOPA ER 25-100 MG PO TBCR
2.00 | EXTENDED_RELEASE_TABLET | ORAL | Status: DC
Start: 2018-01-17 — End: 2018-01-17

## 2018-01-17 MED ORDER — INSULIN LISPRO 100 UNIT/ML ~~LOC~~ SOLN
.00 | SUBCUTANEOUS | Status: DC
Start: 2018-01-17 — End: 2018-01-17

## 2018-01-17 MED ORDER — ENOXAPARIN SODIUM 40 MG/0.4ML ~~LOC~~ SOLN
40.00 mg | SUBCUTANEOUS | Status: DC
Start: 2018-01-18 — End: 2018-01-17

## 2018-01-17 MED ORDER — LISINOPRIL 5 MG PO TABS
2.50 mg | ORAL_TABLET | ORAL | Status: DC
Start: 2018-01-18 — End: 2018-01-17

## 2018-01-17 MED ORDER — DEXTROSE 50 % IV SOLN
12.50 | INTRAVENOUS | Status: DC
Start: ? — End: 2018-01-17

## 2018-01-17 MED ORDER — CARVEDILOL 3.125 MG PO TABS
3.13 mg | ORAL_TABLET | ORAL | Status: DC
Start: 2018-01-17 — End: 2018-01-17

## 2018-01-17 MED ORDER — ATORVASTATIN CALCIUM 20 MG PO TABS
40.00 mg | ORAL_TABLET | ORAL | Status: DC
Start: 2018-01-18 — End: 2018-01-17

## 2018-01-20 ENCOUNTER — Telehealth: Payer: Self-pay

## 2018-01-20 NOTE — Telephone Encounter (Signed)
TOC #1. Called pt to f/u after [facility]. Also wanted to confirm their hosp f/u appt w/ Dr. Yetta BarreJones on 01/24/18 @ 9:30am. Discharge planning includes the following:  - start Levaquin for epididymitis - possible need to consult Urology if no improvement - possible need for repeat scrotal US if no improvement with use of ATB's - start Polytrim [facility], I am also wanting to discuss/review the above information with the pt to ensure all of the above have/has been taken care of. Unable to LVM d/t VM being full.

## 2018-01-21 ENCOUNTER — Telehealth: Payer: Self-pay

## 2018-01-21 NOTE — Telephone Encounter (Signed)
TOC #2. [facility]. Also wanted to confirm their hosp f/u appt w/ Dr. Yetta BarreJones on 01/24/18 @ 9:30am. Discharge planning includes the following:  - start Levaquin for epididymitis - possible need to consult Urology if no improvement - possible need for repeat scrotal US if no improvement with use of ATB's - start Polytrim for [facility]ivitis [facility], I am also wanting to discuss/review the above information with the pt to ensure all of the above have/has been taken care of. Unable to LVM d/t VM being full.

## 2018-01-24 ENCOUNTER — Encounter: Payer: Self-pay | Admitting: Family Medicine

## 2018-01-24 ENCOUNTER — Ambulatory Visit: Payer: BLUE CROSS/BLUE SHIELD | Admitting: Family Medicine

## 2018-01-24 VITALS — BP 120/80 | HR 64 | Ht 71.0 in | Wt 209.0 lb

## 2018-01-24 DIAGNOSIS — Z09 Encounter for follow-up examination after completed treatment for conditions other than malignant neoplasm: Secondary | ICD-10-CM | POA: Diagnosis not present

## 2018-01-24 NOTE — Progress Notes (Signed)
Name: Sean Barajas   MRN: [id number]    DOB: January 14, 1956   Date:01/24/2018       Progress Note  Subjective  Chief Complaint  Chief Complaint  Patient presents with  . Follow-up    hospital d/c on 01/17/18- Dx with epididymitis- started on Levaquin- still swollen some. ?need urology consult    Patient presents for transition to care for hospitalization for epididymitis.   Male GU Problem  The patient's pertinent negatives include no genital injury, genital itching, genital lesions, pelvic pain, penile discharge, penile pain, priapism, scrotal swelling or testicular pain. This is a new problem. The current episode started 1 to 4 weeks ago. The problem occurs daily. The problem has been gradually improving. The patient is experiencing no pain. Pertinent negatives include no abdominal pain, chest pain, chills, constipation, coughing, diarrhea, dysuria, fever, frequency, headaches, joint pain, nausea, rash, shortness of breath, sore throat or urgency. The testicular pain affects the right testicle. There is swelling in the right testicle. There is no history of chlamydia or prostatitis.    No problem-specific Assessment & Plan notes found for this encounter.   Past Medical History:  Diagnosis Date  . Anxiety   . Coronary artery disease   . Hyperlipidemia   . Hypertension   . Parkinson's disease (HCC)   . Prediabetes     Past Surgical History:  Procedure Laterality Date  . APPENDECTOMY    . BALLOON DILATION N/A 11/01/2017   Procedure: BALLOON DILATION;  Surgeon: Scot Jun, MD;  Location: [facility] ENDOSCOPY;  Service: Endoscopy;  Laterality: N/A;  . CARDIAC CATHETERIZATION    . CAROTID STENT  07/2015  . CHOLECYSTECTOMY    . CORONARY ANGIOPLASTY    . CORONARY ARTERY BYPASS GRAFT    . CORONARY STENT INTERVENTION N/A 01/30/2017   Procedure: Coronary Stent Intervention;  Surgeon: Marcina Millard, MD;  Location: [facility] INVASIVE CV LAB;  Service: Cardiovascular;  Laterality: N/A;   . CORONARY STENT INTERVENTION N/A 06/13/2017   Procedure: Coronary Stent Intervention;  Surgeon: Iran Ouch, MD;  Location: [facility] INVASIVE CV LAB;  Service: Cardiovascular;  Laterality: N/A;  . CORONARY STENT INTERVENTION N/A 01/06/2018   Procedure: CORONARY STENT INTERVENTION;  Surgeon: Marcina Millard, MD;  Location: [facility] INVASIVE CV LAB;  Service: Cardiovascular;  Laterality: N/A;  . ESOPHAGOGASTRODUODENOSCOPY (EGD) WITH PROPOFOL N/A 11/01/2017   Procedure: ESOPHAGOGASTRODUODENOSCOPY (EGD) WITH PROPOFOL;  Surgeon: Scot Jun, MD;  Location: [facility] ENDOSCOPY;  Service: Endoscopy;  Laterality: N/A;  . EYE SURGERY Bilateral   . LEFT HEART CATH AND CORS/GRAFTS ANGIOGRAPHY N/A 01/30/2017   Procedure: Left Heart Cath and Cors/Grafts Angiography and possible PCI;  Surgeon: Marcina Millard, MD;  Location: [facility] INVASIVE CV LAB;  Service: Cardiovascular;  Laterality: N/A;  . LEFT HEART CATH AND CORS/GRAFTS ANGIOGRAPHY N/A 06/13/2017   Procedure: Left Heart Cath and Cors/Grafts Angiography;  Surgeon: Lamar Blinks, MD;  Location: [facility] INVASIVE CV LAB;  Service: Cardiovascular;  Laterality: N/A;  . LEFT HEART CATH AND CORS/GRAFTS ANGIOGRAPHY N/A 01/06/2018   Procedure: LEFT HEART CATH AND CORS/GRAFTS ANGIOGRAPHY;  Surgeon: Marcina Millard, MD;  Location: [facility] INVASIVE CV LAB;  Service: Cardiovascular;  Laterality: N/A;  . NASAL SEPTUM SURGERY    . TONSILLECTOMY      Family History  Problem Relation Age of Onset  . Breast cancer Mother   . Diabetes Father   . CAD Father   . CVA Father   . Diabetes Brother     Social History   Socioeconomic  History  . Marital status: Single    Spouse name: Not on file  . Number of children: Not on file  . Years of education: Not on file  . Highest education level: Not on file  Social Needs  . Financial resource strain: Not on file  . Food insecurity - worry: Not on file  . Food insecurity - inability: Not on file  . Transportation  needs - medical: Not on file  . Transportation needs - non-medical: Not on file  Occupational History  . Occupation: self employed    Comment: Psychiatrist  Tobacco Use  . Smoking status: Never Smoker  . Smokeless tobacco: Never Used  Substance and Sexual Activity  . Alcohol use: No    Alcohol/week: 0.0 oz    Comment: 1 glass wine/month  . Drug use: No    Comment: hemp oil  . Sexual activity: Not on file  Other Topics Concern  . Not on file  Social History Narrative  . Not on file    Allergies  Allergen Reactions  . Iodinated Diagnostic Agents Nausea Only and Other (See Comments)    Reaction:  Fever   . Sulfa Antibiotics Rash    Outpatient Medications Prior to Visit  Medication Sig Dispense Refill  . acetaminophen (TYLENOL) 500 MG tablet Take 500 mg by mouth every 6 (six) hours as needed for mild pain.     Marland Kitchen albuterol (PROVENTIL HFA;VENTOLIN HFA) 108 (90 BASE) MCG/ACT inhaler Inhale 2 puffs into the lungs every 6 (six) hours as needed for wheezing or shortness of breath.    . ALPRAZolam (XANAX) 0.5 MG tablet Take 0.25-0.5 mg by mouth 2 (two) times daily. Dr Malvin Johns    . ARTIFICIAL TEAR OP Apply 1 drop to eye as needed (for dry eyes).    Marland Kitchen aspirin EC 81 MG tablet Take 81 mg by mouth daily.    . carbidopa-levodopa (SINEMET CR) 50-200 MG tablet Take 1 tablet by mouth at bedtime. 90 tablet 1  . carbidopa-levodopa (SINEMET IR) 25-100 MG tablet Take 3 tablets by mouth 4 (four) times daily. 1080 tablet 1  . carvedilol (COREG) 6.25 MG tablet Take 6.25 mg by mouth 2 (two) times daily.     . fluticasone (FLONASE) 50 MCG/ACT nasal spray Place 2 sprays into both nostrils daily. 16 g 6  . glucose blood test strip 1 each by Other route daily. And lancets 1/day 100 each 12  . isosorbide mononitrate (IMDUR) 30 MG 24 hr tablet Take 1 tablet (30 mg total) by mouth daily. (Patient taking differently: Take 60 mg by mouth daily. ) 30 tablet 0  . lisinopril (ZESTRIL) 2.5 MG tablet Take 1 tablet  (2.5 mg total) by mouth daily. 30 tablet 0  . metFORMIN (GLUCOPHAGE-XR) 500 MG 24 hr tablet Take 1 tablet (500 mg total) by mouth daily with breakfast. 90 tablet 3  . nitroGLYCERIN (NITROSTAT) 0.4 MG SL tablet Place 0.4 mg under the tongue every 5 (five) minutes as needed for chest pain.    . pantoprazole (PROTONIX) 40 MG tablet Take 40 mg by mouth 2 (two) times daily.    . phenol (CHLORASEPTIC) 1.4 % LIQD Use as directed 1 spray in the mouth or throat as needed for throat irritation / pain.    . rosuvastatin (CRESTOR) 20 MG tablet Take 20 mg by mouth at bedtime.    . ticagrelor (BRILINTA) 90 MG TABS tablet Take 90 mg by mouth every 12 (twelve) hours.    . valACYclovir (  VALTREX) 500 MG tablet Take 500 mg by mouth 2 (two) times daily.    Marland Kitchen neomycin-polymyxin-hydrocortisone (CORTISPORIN) 3.5-10000-1 OTIC suspension Place 3 drops into both ears 4 (four) times daily. (Patient not taking: Reported on 01/06/2018) 10 mL 0  . pramipexole (MIRAPEX) 0.125 MG tablet Take 0.125-0.5 mg by mouth 3 (three) times daily.    . promethazine (PHENERGAN) 25 MG tablet Take 25 mg by mouth every 8 (eight) hours as needed for nausea or vomiting.    . rotigotine (NEUPRO) 4 MG/24HR Place 1 patch onto the skin daily.    . APOKYN 30 MG/3ML SOCT INJECT 0.2ML SUBCUTANEOUSLY AS STARTER DOSE AND TITRATE TO EFFECTIVE DOSE (MAX OF 0.6ML) BASED ON MANUFACTURER GUIDELINES (UP TO 3 TIMES PER 30 Cartridge 1  . chlorhexidine (HIBICLENS) 4 % external liquid Apply topically daily as needed. (Patient not taking: Reported on 01/06/2018) 120 mL 0  . mupirocin ointment (BACTROBAN) 2 % Apply 1 application topically 2 (two) times daily. (Patient not taking: Reported on 01/06/2018) 22 g 0  . sitaGLIPtin (JANUVIA) 50 MG tablet Take 1 tablet (50 mg total) by mouth daily. (Patient not taking: Reported on 01/06/2018) 30 tablet 11   No facility-administered medications prior to visit.     Review of Systems  Constitutional: Negative for chills, fever,  malaise/fatigue and weight loss.  HENT: Negative for ear discharge, ear pain and sore throat.   Eyes: Negative for blurred vision.  Respiratory: Negative for cough, sputum production, shortness of breath and wheezing.   Cardiovascular: Negative for chest pain, palpitations and leg swelling.  Gastrointestinal: Negative for abdominal pain, blood in stool, constipation, diarrhea, heartburn, melena and nausea.  Genitourinary: Negative for discharge, dysuria, frequency, hematuria, pelvic pain, penile pain, scrotal swelling, testicular pain and urgency.  Musculoskeletal: Negative for back pain, joint pain, myalgias and neck pain.  Skin: Negative for rash.  Neurological: Negative for dizziness, tingling, sensory change, focal weakness and headaches.  Endo/Heme/Allergies: Negative for environmental allergies and polydipsia. Does not bruise/bleed easily.  Psychiatric/Behavioral: Negative for depression and suicidal ideas. The patient is not nervous/anxious and does not have insomnia.      Objective  Vitals:   01/24/18 0932  BP: 120/80  Pulse: 64  Weight: 209 lb (94.8 kg)  Height: 5\' 11"  (1.803 m)    Physical Exam  Constitutional: He is oriented to person, place, and time and well-developed, well-nourished, and in no distress.  HENT:  Head: Normocephalic.  Right Ear: External ear normal.  Left Ear: External ear normal.  Nose: Nose normal.  Mouth/Throat: Oropharynx is clear and moist.  Eyes: Conjunctivae and EOM are normal. Pupils are equal, round, and reactive to light. Right eye exhibits no discharge. Left eye exhibits no discharge. No scleral icterus.  Neck: Normal range of motion. Neck supple. No JVD present. No tracheal deviation present. No thyromegaly present.  Cardiovascular: Normal rate, regular rhythm, normal heart sounds and intact distal pulses. Exam reveals no gallop and no friction rub.  No murmur heard. Pulmonary/Chest: Breath sounds normal. No respiratory distress. He has no  wheezes. He has no rales.  Abdominal: Soft. Bowel sounds are normal. He exhibits no mass. There is no hepatosplenomegaly. There is no tenderness. There is no rebound, no guarding and no CVA tenderness.  Genitourinary: Rectum normal, prostate normal and penis normal. Rectal exam shows guaiac negative stool. No discharge found.  Musculoskeletal: Normal range of motion. He exhibits no edema or tenderness.  Lymphadenopathy:    He has no cervical adenopathy.  Neurological: He is  alert and oriented to person, place, and time. He has normal sensation, normal strength, normal reflexes and intact cranial nerves. No cranial nerve deficit.  Skin: Skin is warm. No rash noted.  Psychiatric: Mood and affect normal.  Nursing note and vitals reviewed.     Assessment & Plan  Problem List Items Addressed This Visit    None    Visit Diagnoses    Hospital discharge follow-up    -  Primary      No orders of the defined types were placed in this encounter.     Dr. Hayden Rasmusseneanna Jones [facility]  01/24/18

## 2018-02-27 ENCOUNTER — Ambulatory Visit (INDEPENDENT_AMBULATORY_CARE_PROVIDER_SITE_OTHER): Payer: BLUE CROSS/BLUE SHIELD | Admitting: Family Medicine

## 2018-02-27 ENCOUNTER — Encounter: Payer: Self-pay | Admitting: Family Medicine

## 2018-02-27 VITALS — BP 124/88 | HR 64 | Ht 71.0 in | Wt 209.0 lb

## 2018-02-27 DIAGNOSIS — H6121 Impacted cerumen, right ear: Secondary | ICD-10-CM | POA: Diagnosis not present

## 2018-02-27 DIAGNOSIS — H66001 Acute suppurative otitis media without spontaneous rupture of ear drum, right ear: Secondary | ICD-10-CM | POA: Diagnosis not present

## 2018-02-27 DIAGNOSIS — I700 Atherosclerosis of aorta: Secondary | ICD-10-CM

## 2018-02-27 MED ORDER — OFLOXACIN 0.3 % OT SOLN
5.0000 [drp] | Freq: Every day | OTIC | 0 refills | Status: DC
Start: 2018-02-27 — End: 2018-05-23

## 2018-02-27 MED ORDER — AMOXICILLIN-POT CLAVULANATE 875-125 MG PO TABS: 1.0000 | ORAL_TABLET | Freq: Two times a day (BID) | ORAL | 0 refills | Status: DC

## 2018-02-27 NOTE — Progress Notes (Signed)
Name: Sean Barajas   MRN: [id number]    DOB: 07/01/1956   Date:02/27/2018       Progress Note  Subjective  Chief Complaint  No chief complaint on file.   Otalgia   There is pain in the left ear. This is a new problem. The current episode started in the past 7 days (3 days). The problem occurs hourly. The problem has been waxing and waning. There has been no fever. The pain is at a severity of 4/10. The pain is moderate. Associated symptoms include hearing loss. Pertinent negatives include no abdominal pain, coughing, diarrhea, ear discharge, headaches, neck pain, rash, rhinorrhea, sore throat or vomiting. Associated symptoms comments: decreased. He has tried nothing for the symptoms.    No problem-specific Assessment & Plan notes found for this encounter.   Past Medical History:  Diagnosis Date  . Anxiety   . Coronary artery disease   . Hyperlipidemia   . Hypertension   . Parkinson's disease (HCC)   . Prediabetes     Past Surgical History:  Procedure Laterality Date  . APPENDECTOMY    . BALLOON DILATION N/A 11/01/2017   Procedure: BALLOON DILATION;  Surgeon: Scot Jun, MD;  Location: [facility] ENDOSCOPY;  Service: Endoscopy;  Laterality: N/A;  . CARDIAC CATHETERIZATION    . CAROTID STENT  07/2015  . CHOLECYSTECTOMY    . CORONARY ANGIOPLASTY    . CORONARY ARTERY BYPASS GRAFT    . CORONARY STENT INTERVENTION N/A 01/30/2017   Procedure: Coronary Stent Intervention;  Surgeon: Marcina Millard, MD;  Location: [facility] INVASIVE CV LAB;  Service: Cardiovascular;  Laterality: N/A;  . CORONARY STENT INTERVENTION N/A 06/13/2017   Procedure: Coronary Stent Intervention;  Surgeon: Iran Ouch, MD;  Location: [facility] INVASIVE CV LAB;  Service: Cardiovascular;  Laterality: N/A;  . CORONARY STENT INTERVENTION N/A 01/06/2018   Procedure: CORONARY STENT INTERVENTION;  Surgeon: Marcina Millard, MD;  Location: [facility] INVASIVE CV LAB;  Service: Cardiovascular;  Laterality: N/A;  .  ESOPHAGOGASTRODUODENOSCOPY (EGD) WITH PROPOFOL N/A 11/01/2017   Procedure: ESOPHAGOGASTRODUODENOSCOPY (EGD) WITH PROPOFOL;  Surgeon: Scot Jun, MD;  Location: [facility] ENDOSCOPY;  Service: Endoscopy;  Laterality: N/A;  . EYE SURGERY Bilateral   . LEFT HEART CATH AND CORS/GRAFTS ANGIOGRAPHY N/A 01/30/2017   Procedure: Left Heart Cath and Cors/Grafts Angiography and possible PCI;  Surgeon: Marcina Millard, MD;  Location: [facility] INVASIVE CV LAB;  Service: Cardiovascular;  Laterality: N/A;  . LEFT HEART CATH AND CORS/GRAFTS ANGIOGRAPHY N/A 06/13/2017   Procedure: Left Heart Cath and Cors/Grafts Angiography;  Surgeon: Lamar Blinks, MD;  Location: [facility] INVASIVE CV LAB;  Service: Cardiovascular;  Laterality: N/A;  . LEFT HEART CATH AND CORS/GRAFTS ANGIOGRAPHY N/A 01/06/2018   Procedure: LEFT HEART CATH AND CORS/GRAFTS ANGIOGRAPHY;  Surgeon: Marcina Millard, MD;  Location: [facility] INVASIVE CV LAB;  Service: Cardiovascular;  Laterality: N/A;  . NASAL SEPTUM SURGERY    . TONSILLECTOMY      Family History  Problem Relation Age of Onset  . Breast cancer Mother   . Diabetes Father   . CAD Father   . CVA Father   . Diabetes Brother     Social History   Socioeconomic History  . Marital status: Single    Spouse name: Not on file  . Number of children: Not on file  . Years of education: Not on file  . Highest education level: Not on file  Occupational History  . Occupation: self employed    Comment: Psychiatrist  Social Needs  . Financial resource strain: Not on file  . Food insecurity:    Worry: Not on file    Inability: Not on file  . Transportation needs:    Medical: Not on file    Non-medical: Not on file  Tobacco Use  . Smoking status: Never Smoker  . Smokeless tobacco: Never Used  Substance and Sexual Activity  . Alcohol use: No    Alcohol/week: 0.0 oz    Comment: 1 glass wine/month  . Drug use: No    Comment: hemp oil  . Sexual activity: Not on file  Lifestyle   . Physical activity:    Days per week: Not on file    Minutes per session: Not on file  . Stress: Not on file  Relationships  . Social connections:    Talks on phone: Not on file    Gets together: Not on file    Attends religious service: Not on file    Active member of club or organization: Not on file    Attends meetings of clubs or organizations: Not on file    Relationship status: Not on file  . Intimate partner violence:    Fear of current or ex partner: Not on file    Emotionally abused: Not on file    Physically abused: Not on file    Forced sexual activity: Not on file  Other Topics Concern  . Not on file  Social History Narrative  . Not on file    Allergies  Allergen Reactions  . Iodinated Diagnostic Agents Nausea Only and Other (See Comments)    Reaction:  Fever   . Sulfa Antibiotics Rash    Outpatient Medications Prior to Visit  Medication Sig Dispense Refill  . acetaminophen (TYLENOL) 500 MG tablet Take 500 mg by mouth every 6 (six) hours as needed for mild pain.     Marland Kitchen. albuterol (PROVENTIL HFA;VENTOLIN HFA) 108 (90 BASE) MCG/ACT inhaler Inhale 2 puffs into the lungs every 6 (six) hours as needed for wheezing or shortness of breath.    . ALPRAZolam (XANAX) 0.5 MG tablet Take 0.25-0.5 mg by mouth 2 (two) times daily. Dr Malvin JohnsPotter    . ARTIFICIAL TEAR OP Apply 1 drop to eye as needed (for dry eyes).    Marland Kitchen. aspirin EC 81 MG tablet Take 81 mg by mouth daily.    . carbidopa-levodopa (SINEMET CR) 50-200 MG tablet Take 1 tablet by mouth at bedtime. 90 tablet 1  . carbidopa-levodopa (SINEMET IR) 25-100 MG tablet Take 3 tablets by mouth 4 (four) times daily. 1080 tablet 1  . carvedilol (COREG) 6.25 MG tablet Take 6.25 mg by mouth 2 (two) times daily.     . fluticasone (FLONASE) 50 MCG/ACT nasal spray Place 2 sprays into both nostrils daily. 16 g 6  . glucose blood test strip 1 each by Other route daily. And lancets 1/day 100 each 12  . isosorbide mononitrate (IMDUR) 30 MG 24  hr tablet Take 1 tablet (30 mg total) by mouth daily. (Patient taking differently: Take 60 mg by mouth daily. ) 30 tablet 0  . lisinopril (ZESTRIL) 2.5 MG tablet Take 1 tablet (2.5 mg total) by mouth daily. 30 tablet 0  . metFORMIN (GLUCOPHAGE-XR) 500 MG 24 hr tablet Take 1 tablet (500 mg total) by mouth daily with breakfast. 90 tablet 3  . neomycin-polymyxin-hydrocortisone (CORTISPORIN) 3.5-10000-1 OTIC suspension Place 3 drops into both ears 4 (four) times daily. (Patient not taking: Reported on 01/06/2018) 10 mL  0  . nitroGLYCERIN (NITROSTAT) 0.4 MG SL tablet Place 0.4 mg under the tongue every 5 (five) minutes as needed for chest pain.    . pantoprazole (PROTONIX) 40 MG tablet Take 40 mg by mouth 2 (two) times daily.    . phenol (CHLORASEPTIC) 1.4 % LIQD Use as directed 1 spray in the mouth or throat as needed for throat irritation / pain.    . pramipexole (MIRAPEX) 0.125 MG tablet Take 0.125-0.5 mg by mouth 3 (three) times daily.    . promethazine (PHENERGAN) 25 MG tablet Take 25 mg by mouth every 8 (eight) hours as needed for nausea or vomiting.    . rosuvastatin (CRESTOR) 20 MG tablet Take 20 mg by mouth at bedtime.    . rotigotine (NEUPRO) 4 MG/24HR Place 1 patch onto the skin daily.    . ticagrelor (BRILINTA) 90 MG TABS tablet Take 90 mg by mouth every 12 (twelve) hours.    . valACYclovir (VALTREX) 500 MG tablet Take 500 mg by mouth 2 (two) times daily.     No facility-administered medications prior to visit.     Review of Systems  Constitutional: Negative for chills, fever, malaise/fatigue and weight loss.  HENT: Positive for ear pain and hearing loss. Negative for ear discharge, rhinorrhea and sore throat.   Eyes: Negative for blurred vision.  Respiratory: Negative for cough, sputum production, shortness of breath and wheezing.   Cardiovascular: Negative for chest pain, palpitations and leg swelling.  Gastrointestinal: Negative for abdominal pain, blood in stool, constipation,  diarrhea, heartburn, melena, nausea and vomiting.  Genitourinary: Negative for dysuria, frequency, hematuria and urgency.  Musculoskeletal: Negative for back pain, joint pain, myalgias and neck pain.  Skin: Negative for rash.  Neurological: Negative for dizziness, tingling, sensory change, focal weakness and headaches.  Endo/Heme/Allergies: Negative for environmental allergies and polydipsia. Does not bruise/bleed easily.  Psychiatric/Behavioral: Negative for depression and suicidal ideas. The patient is not nervous/anxious and does not have insomnia.      Objective  Vitals:   02/27/18 1419  BP: 124/88  Pulse: 64  Weight: 209 lb (94.8 kg)  Height: 5\' 11"  (1.803 m)    Physical Exam  Constitutional: He is oriented to person, place, and time and well-developed, well-nourished, and in no distress.  HENT:  Head: Normocephalic and atraumatic.  Right Ear: External ear and ear canal normal. No drainage or swelling. Tympanic membrane is not erythematous. No decreased hearing is noted.  Left Ear: External ear and ear canal normal. No drainage. Tympanic membrane is erythematous. Decreased hearing is noted.  Nose: Nose normal.  Mouth/Throat: Uvula is midline and oropharynx is clear and moist. No oropharyngeal exudate, posterior oropharyngeal edema or posterior oropharyngeal erythema.  Cerumen impacted right  Eyes: Pupils are equal, round, and reactive to light. Conjunctivae and EOM are normal. Right eye exhibits no discharge. Left eye exhibits no discharge. No scleral icterus.  Neck: Normal range of motion. Neck supple. No JVD present. No tracheal deviation present. No thyromegaly present.  Cardiovascular: Normal rate, regular rhythm, normal heart sounds and intact distal pulses. Exam reveals no gallop and no friction rub.  No murmur heard. Pulmonary/Chest: Breath sounds normal. No respiratory distress. He has no wheezes. He has no rales.  Abdominal: Soft. Bowel sounds are normal. He exhibits  no mass. There is no hepatosplenomegaly. There is no tenderness. There is no rebound, no guarding and no CVA tenderness.  Musculoskeletal: Normal range of motion. He exhibits no edema or tenderness.  Lymphadenopathy:  He has no cervical adenopathy.  Neurological: He is alert and oriented to person, place, and time. He has normal sensation, normal strength, normal reflexes and intact cranial nerves. No cranial nerve deficit.  Skin: Skin is warm. No rash noted.  Psychiatric: Mood and affect normal.  Nursing note and vitals reviewed.     Assessment & Plan  Problem List Items Addressed This Visit      Cardiovascular and Mediastinum   Aortic atherosclerosis (HCC)    Other Visit Diagnoses    Acute suppurative otitis media of right ear without spontaneous rupture of tympanic membrane, recurrence not specified    -  Primary   Relevant Medications   amoxicillin-clavulanate (AUGMENTIN) 875-125 MG tablet   ofloxacin (FLOXIN) 0.3 % OTIC solution   Impacted cerumen of right ear       removed with gentle irragation      Meds ordered this encounter  Medications  . amoxicillin-clavulanate (AUGMENTIN) 875-125 MG tablet    Sig: Take 1 tablet by mouth 2 (two) times daily.    Dispense:  20 tablet    Refill:  0  . ofloxacin (FLOXIN) 0.3 % OTIC solution    Sig: Place 5 drops into the right ear daily.    Dispense:  5 mL    Refill:  0      Dr. Elizabeth Sauer [facility]  02/27/18

## 2018-02-28 ENCOUNTER — Encounter: Payer: Self-pay | Admitting: Neurology

## 2018-02-28 ENCOUNTER — Ambulatory Visit: Payer: BLUE CROSS/BLUE SHIELD

## 2018-03-10 ENCOUNTER — Telehealth: Payer: Self-pay | Admitting: Neurology

## 2018-03-10 NOTE — Telephone Encounter (Signed)
We appealed denial on [employer] and actually just got authorization today. Approval valid until 02/16/21. I will call patient and let him know.

## 2018-03-10 NOTE — Telephone Encounter (Signed)
Patient made aware.

## 2018-03-10 NOTE — Telephone Encounter (Signed)
Jade-  Can you handle this please

## 2018-03-10 NOTE — Telephone Encounter (Signed)
Pt called and said insurance denied the new medication( thought the name was synamet)  which was an inhaler but as long as he has taken the Apokyn then they would approve it

## 2018-03-13 ENCOUNTER — Telehealth: Payer: Self-pay | Admitting: Neurology

## 2018-03-13 NOTE — Telephone Encounter (Signed)
Called and spoke with Pt concerning the alprazolam refill request. Pt states Dr Malvin JohnsPotter Rx'd this for him, 0.5mg  BID. He is requesting Dr Tat to Rx since Dr Malvin JohnsPotter referred him here.

## 2018-03-13 NOTE — Telephone Encounter (Signed)
*  STAT* If patient is at the pharmacy, call can be transferred to refill team.  1.     Which medications need to be refilled? (please list name of each medication and dose if know) Alprazolam  2.     Which pharmacy/location (including street and city if local pharmacy) is medication to be sent to?  3.     Do they need a 30 or 90 day supply?

## 2018-03-13 NOTE — Telephone Encounter (Signed)
Talked with Dr. Arbutus Leasat. She does not Rx alprazolam. Called Pt, he had stepped away, LM for him to call the office.

## 2018-03-17 ENCOUNTER — Telehealth: Payer: Self-pay | Admitting: Neurology

## 2018-03-17 NOTE — Telephone Encounter (Signed)
Pt LM on my VM. I called him back, advised him Dr Tat does not Rx alprazalam. Pt verbalized understanding.

## 2018-04-01 ENCOUNTER — Ambulatory Visit (INDEPENDENT_AMBULATORY_CARE_PROVIDER_SITE_OTHER): Payer: BLUE CROSS/BLUE SHIELD | Admitting: Endocrinology

## 2018-04-01 ENCOUNTER — Encounter: Payer: Self-pay | Admitting: Endocrinology

## 2018-04-01 VITALS — BP 138/78 | HR 61 | Wt 212.0 lb

## 2018-04-01 DIAGNOSIS — E1165 Type 2 diabetes mellitus with hyperglycemia: Secondary | ICD-10-CM | POA: Diagnosis not present

## 2018-04-01 LAB — POCT GLYCOSYLATED HEMOGLOBIN (HGB A1C): HEMOGLOBIN A1C: 7.1

## 2018-04-01 MED ORDER — METFORMIN HCL ER 500 MG PO TB24: 1000.0000 mg | ORAL_TABLET | Freq: Every day | ORAL | 3 refills | Status: DC

## 2018-04-01 NOTE — Progress Notes (Signed)
Subjective:    Patient ID: Sean Barajas, male    DOB: [birth year], 62 y.o.   MRN: [id number]  HPI Pt returns for f/u of diabetes mellitus:  DM type: 2 Dx'ed: 2018 Complications: CAD Therapy: metformin DKA: never Severe hypoglycemia: never Pancreatitis: never Pancreatic imaging: normal on 2018 CT.  Other: he has never been on insulin; edema limits rx options; he did not tolerate jardiance (sensation in throat--he had stricture dilated in 2018) Interval history: nausea is resolved.  pt states he feels well in general.  He stopped [place], due to same throat symptoms as jardiance.  Pt says GI told him there is nothing further to offer.  Past Medical History:  Diagnosis Date  . Anxiety   . Coronary artery disease   . Hyperlipidemia   . Hypertension   . Parkinson's disease (HCC)   . Prediabetes     Past Surgical History:  Procedure Laterality Date  . APPENDECTOMY    . BALLOON DILATION N/A 11/01/2017   Procedure: BALLOON DILATION;  Surgeon: Scot Jun, MD;  Location: [facility] ENDOSCOPY;  Service: Endoscopy;  Laterality: N/A;  . CARDIAC CATHETERIZATION    . CAROTID STENT  07/2015  . CHOLECYSTECTOMY    . CORONARY ANGIOPLASTY    . CORONARY ARTERY BYPASS GRAFT    . CORONARY STENT INTERVENTION N/A 01/30/2017   Procedure: Coronary Stent Intervention;  Surgeon: Marcina Millard, MD;  Location: [facility] INVASIVE CV LAB;  Service: Cardiovascular;  Laterality: N/A;  . CORONARY STENT INTERVENTION N/A 06/13/2017   Procedure: Coronary Stent Intervention;  Surgeon: Iran Ouch, MD;  Location: [facility] INVASIVE CV LAB;  Service: Cardiovascular;  Laterality: N/A;  . CORONARY STENT INTERVENTION N/A 01/06/2018   Procedure: CORONARY STENT INTERVENTION;  Surgeon: Marcina Millard, MD;  Location: [facility] INVASIVE CV LAB;  Service: Cardiovascular;  Laterality: N/A;  . ESOPHAGOGASTRODUODENOSCOPY (EGD) WITH PROPOFOL N/A 11/01/2017   Procedure: ESOPHAGOGASTRODUODENOSCOPY (EGD) WITH PROPOFOL;   Surgeon: Scot Jun, MD;  Location: [facility] ENDOSCOPY;  Service: Endoscopy;  Laterality: N/A;  . EYE SURGERY Bilateral   . LEFT HEART CATH AND CORS/GRAFTS ANGIOGRAPHY N/A 01/30/2017   Procedure: Left Heart Cath and Cors/Grafts Angiography and possible PCI;  Surgeon: Marcina Millard, MD;  Location: [facility] INVASIVE CV LAB;  Service: Cardiovascular;  Laterality: N/A;  . LEFT HEART CATH AND CORS/GRAFTS ANGIOGRAPHY N/A 06/13/2017   Procedure: Left Heart Cath and Cors/Grafts Angiography;  Surgeon: Lamar Blinks, MD;  Location: [facility] INVASIVE CV LAB;  Service: Cardiovascular;  Laterality: N/A;  . LEFT HEART CATH AND CORS/GRAFTS ANGIOGRAPHY N/A 01/06/2018   Procedure: LEFT HEART CATH AND CORS/GRAFTS ANGIOGRAPHY;  Surgeon: Marcina Millard, MD;  Location: [facility] INVASIVE CV LAB;  Service: Cardiovascular;  Laterality: N/A;  . NASAL SEPTUM SURGERY    . TONSILLECTOMY      Social History   Socioeconomic History  . Marital status: Single    Spouse name: Not on file  . Number of children: Not on file  . Years of education: Not on file  . Highest education level: Not on file  Occupational History  . Occupation: self employed    Comment: Psychiatrist  Social Needs  . Financial resource strain: Not on file  . Food insecurity:    Worry: Not on file    Inability: Not on file  . Transportation needs:    Medical: Not on file    Non-medical: Not on file  Tobacco Use  . Smoking status: Never Smoker  . Smokeless tobacco: Never Used  Substance and Sexual Activity  . Alcohol use: No    Alcohol/week: 0.0 oz    Comment: 1 glass wine/month  . Drug use: No    Comment: hemp oil  . Sexual activity: Not on file  Lifestyle  . Physical activity:    Days per week: Not on file    Minutes per session: Not on file  . Stress: Not on file  Relationships  . Social connections:    Talks on phone: Not on file    Gets together: Not on file    Attends religious service: Not on file    Active member of  club or organization: Not on file    Attends meetings of clubs or organizations: Not on file    Relationship status: Not on file  . Intimate partner violence:    Fear of current or ex partner: Not on file    Emotionally abused: Not on file    Physically abused: Not on file    Forced sexual activity: Not on file  Other Topics Concern  . Not on file  Social History Narrative  . Not on file    Current Outpatient Medications on File Prior to Visit  Medication Sig Dispense Refill  . albuterol (PROVENTIL HFA;VENTOLIN HFA) 108 (90 BASE) MCG/ACT inhaler Inhale 2 puffs into the lungs every 6 (six) hours as needed for wheezing or shortness of breath.    . ALPRAZolam (XANAX) 0.5 MG tablet Take 0.25-0.5 mg by mouth 2 (two) times daily. Dr Malvin Johns    . ARTIFICIAL TEAR OP Apply 1 drop to eye as needed (for dry eyes).    Marland Kitchen aspirin EC 81 MG tablet Take 81 mg by mouth daily.    . carbidopa-levodopa (SINEMET CR) 50-200 MG tablet Take 1 tablet by mouth at bedtime. 90 tablet 1  . carbidopa-levodopa (SINEMET IR) 25-100 MG tablet Take 3 tablets by mouth 4 (four) times daily. 1080 tablet 1  . carvedilol (COREG) 6.25 MG tablet Take 6.25 mg by mouth 2 (two) times daily.     . fluticasone (FLONASE) 50 MCG/ACT nasal spray Place 2 sprays into both nostrils daily. 16 g 6  . glucose blood test strip 1 each by Other route daily. And lancets 1/day 100 each 12  . isosorbide mononitrate (IMDUR) 30 MG 24 hr tablet Take 1 tablet (30 mg total) by mouth daily. (Patient taking differently: Take 60 mg by mouth daily. ) 30 tablet 0  . lisinopril (ZESTRIL) 2.5 MG tablet Take 1 tablet (2.5 mg total) by mouth daily. 30 tablet 0  . neomycin-polymyxin-hydrocortisone (CORTISPORIN) 3.5-10000-1 OTIC suspension Place 3 drops into both ears 4 (four) times daily. 10 mL 0  . nitroGLYCERIN (NITROSTAT) 0.4 MG SL tablet Place 0.4 mg under the tongue every 5 (five) minutes as needed for chest pain.    . pantoprazole (PROTONIX) 40 MG tablet Take  40 mg by mouth 2 (two) times daily.    . phenol (CHLORASEPTIC) 1.4 % LIQD Use as directed 1 spray in the mouth or throat as needed for throat irritation / pain.    . pramipexole (MIRAPEX) 0.125 MG tablet Take 0.125-0.5 mg by mouth 3 (three) times daily.    . rosuvastatin (CRESTOR) 20 MG tablet Take 20 mg by mouth at bedtime.    . ticagrelor (BRILINTA) 90 MG TABS tablet Take 90 mg by mouth every 12 (twelve) hours.    . valACYclovir (VALTREX) 500 MG tablet Take 500 mg by mouth 2 (two) times daily.    Marland Kitchen  acetaminophen (TYLENOL) 500 MG tablet Take 500 mg by mouth every 6 (six) hours as needed for mild pain.     Marland Kitchen ofloxacin (FLOXIN) 0.3 % OTIC solution Place 5 drops into the right ear daily. (Patient not taking: Reported on 04/01/2018) 5 mL 0  . promethazine (PHENERGAN) 25 MG tablet Take 25 mg by mouth every 8 (eight) hours as needed for nausea or vomiting.    . rotigotine (NEUPRO) 4 MG/24HR Place 1 patch onto the skin daily.     No current facility-administered medications on file prior to visit.     Allergies  Allergen Reactions  . Iodinated Diagnostic Agents Nausea Only and Other (See Comments)    Reaction:  Fever   . Sulfa Antibiotics Rash    Family History  Problem Relation Age of Onset  . Breast cancer Mother   . Diabetes Father   . CAD Father   . CVA Father   . Diabetes Brother     BP 138/78   Pulse 61   Wt 212 lb (96.2 kg)   SpO2 97%   BMI 29.57 kg/m    Review of Systems He denies hypoglycemia    Objective:   Physical Exam VITAL SIGNS:  See vs page GENERAL: no distress Pulses: dorsalis pedis intact bilat.   MSK: no deformity of the feet CV: 1+ bilat leg edema Skin:  no ulcer on the feet.  normal color and temp on the feet. Neuro: sensation is intact to touch on the feet   Lab Results  Component Value Date   CREATININE 0.88 01/07/2018   BUN 16 01/07/2018   NA 137 01/07/2018   K 4.0 01/07/2018   CL 105 01/07/2018   CO2 24 01/07/2018   Lab Results    Component Value Date   HGBA1C 7.1 04/01/2018       Assessment & Plan:  Edema: this limits rx options.  Type 2 DM, with CAD: he needs increased rx.  Throat sxs: this also limits rx options.   Patient Instructions  I have sent a prescription to your pharmacy, to double the metformin, and: Stop taking the [place]. check your blood sugar once a day.  vary the time of day when you check, between before the 3 meals, and at bedtime.  also check if you have symptoms of your blood sugar being too high or too low.  please keep a record of the readings and bring it to your next appointment here (or you can bring the meter itself).  You can write it on any piece of paper.  please call us sooner if your blood sugar goes below 70, or if you have a lot of readings over 200.  Please come back for a follow-up appointment in 3-4 months.

## 2018-04-01 NOTE — Patient Instructions (Addendum)
I have sent a prescription to your pharmacy, to double the metformin, and: Stop taking the [place]. check your blood sugar once a day.  vary the time of day when you check, between before the 3 meals, and at bedtime.  also check if you have symptoms of your blood sugar being too high or too low.  please keep a record of the readings and bring it to your next appointment here (or you can bring the meter itself).  You can write it on any piece of paper.  please call us sooner if your blood sugar goes below 70, or if you have a lot of readings over 200.  Please come back for a follow-up appointment in 3-4 months.

## 2018-04-02 ENCOUNTER — Telehealth: Payer: Self-pay | Admitting: Endocrinology

## 2018-04-02 ENCOUNTER — Other Ambulatory Visit: Payer: Self-pay

## 2018-04-02 MED ORDER — METFORMIN HCL ER 500 MG PO TB24: 1000.0000 mg | ORAL_TABLET | Freq: Every day | ORAL | 3 refills | Status: DC

## 2018-04-02 NOTE — Telephone Encounter (Signed)
I have sent to patient;'s pharmacy.  

## 2018-04-02 NOTE — Telephone Encounter (Signed)
Patient need a refill for metformin tablets (not the ER) send to  [facility] - [place], [place] - [address]

## 2018-04-03 ENCOUNTER — Telehealth: Payer: Self-pay | Admitting: Endocrinology

## 2018-04-03 ENCOUNTER — Other Ambulatory Visit: Payer: Self-pay

## 2018-04-03 NOTE — Telephone Encounter (Signed)
Which should patient be taking? I only see metformin ER in chart?

## 2018-04-03 NOTE — Telephone Encounter (Signed)
I sent rx for the -XR--that is all she needs.

## 2018-04-03 NOTE — Telephone Encounter (Signed)
Caregiver called re: PHARM had script for Metformin tablet form extended release-BUT Patient needs RX for Metformin tablet non extended release sent to [facility] in [place]

## 2018-04-04 ENCOUNTER — Telehealth: Payer: Self-pay | Admitting: Endocrinology

## 2018-04-04 MED ORDER — METFORMIN HCL 500 MG PO TABS: 1000.0000 mg | ORAL_TABLET | Freq: Two times a day (BID) | ORAL | 3 refills | Status: DC

## 2018-04-04 NOTE — Telephone Encounter (Signed)
LVM asking patient to clarify message. I stated that metformin prescription should be for the XR.

## 2018-04-04 NOTE — Telephone Encounter (Signed)
Patient request a refill for medication metformin tablets (not ER)  Send to   Pharmacy:  [facility] - [place], [place] - [address] DEA #:  --

## 2018-04-04 NOTE — Telephone Encounter (Signed)
Ok, I have sent a prescription to your pharmacy 

## 2018-04-04 NOTE — Telephone Encounter (Signed)
LVM that regular metformin was sent to pharmacy.

## 2018-04-17 ENCOUNTER — Telehealth: Payer: Self-pay | Admitting: Neurology

## 2018-04-17 NOTE — Telephone Encounter (Signed)
Received paperwork from [employer], again, stating prior auth/appeal approved from 02/18/18-02/16/21. Auth#[id number]. Specialty pharmacy is [facility] ph. [phone]. Fax. [phone].

## 2018-04-24 ENCOUNTER — Ambulatory Visit: Payer: BLUE CROSS/BLUE SHIELD | Admitting: Neurology

## 2018-04-29 ENCOUNTER — Other Ambulatory Visit: Payer: Self-pay | Admitting: Student

## 2018-04-29 DIAGNOSIS — J329 Chronic sinusitis, unspecified: Secondary | ICD-10-CM

## 2018-05-04 ENCOUNTER — Other Ambulatory Visit: Payer: BLUE CROSS/BLUE SHIELD

## 2018-05-12 ENCOUNTER — Inpatient Hospital Stay: Admission: RE | Admit: 2018-05-12 | Payer: BLUE CROSS/BLUE SHIELD | Source: Ambulatory Visit

## 2018-05-17 ENCOUNTER — Ambulatory Visit
Admission: RE | Admit: 2018-05-17 | Discharge: 2018-05-17 | Disposition: A | Discharge status: Home / Self Care | Payer: Medicare Other | Source: Ambulatory Visit | Attending: Student | Admitting: Student

## 2018-05-17 DIAGNOSIS — J329 Chronic sinusitis, unspecified: Secondary | ICD-10-CM

## 2018-05-21 ENCOUNTER — Other Ambulatory Visit: Payer: Self-pay

## 2018-05-21 ENCOUNTER — Other Ambulatory Visit: Payer: Self-pay | Admitting: Student

## 2018-05-21 DIAGNOSIS — J328 Other chronic sinusitis: Secondary | ICD-10-CM

## 2018-05-22 ENCOUNTER — Other Ambulatory Visit: Payer: Self-pay | Admitting: Student

## 2018-05-22 DIAGNOSIS — J328 Other chronic sinusitis: Secondary | ICD-10-CM
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
## 2018-05-23 ENCOUNTER — Encounter: Payer: Self-pay | Admitting: Neurology

## 2018-05-23 ENCOUNTER — Ambulatory Visit (INDEPENDENT_AMBULATORY_CARE_PROVIDER_SITE_OTHER): Payer: Medicare Other | Admitting: Neurology

## 2018-05-23 VITALS — BP 124/60 | HR 64 | Ht 71.0 in | Wt 207.0 lb

## 2018-05-23 DIAGNOSIS — G20 Parkinson's disease: Secondary | ICD-10-CM | POA: Diagnosis not present

## 2018-05-23 DIAGNOSIS — J3489 Other specified disorders of nose and nasal sinuses: Secondary | ICD-10-CM

## 2018-05-23 NOTE — Patient Instructions (Addendum)
1.  Restart neupro, 4 mg patches 2.  Continue your carbidopa/levodopa 25/100, 3 tablets 4 times per day and carbidopa/levodopa 50/200 at bed. 3.  Try taking your inbrija spray to help with first morning on 4.  We will do your on/off test on august 12 at 11:45.  Do not take inbrija, neupro or carbidopa/levodopa.  Take off the neupro 24 hours before hand.  You can take your carbidopa/levodopa and inbrija until 4pm the night before the test.

## 2018-05-26 NOTE — Telephone Encounter (Signed)
error 

## 2018-06-02 ENCOUNTER — Ambulatory Visit
Admission: RE | Admit: 2018-06-02 | Discharge: 2018-06-02 | Disposition: A | Discharge status: Home / Self Care | Payer: Medicare Other | Source: Ambulatory Visit | Attending: Student | Admitting: Student

## 2018-06-02 DIAGNOSIS — J328 Other chronic sinusitis: Secondary | ICD-10-CM

## 2018-06-02 MED ORDER — GADOBENATE DIMEGLUMINE 529 MG/ML IV SOLN
20.0000 mL | Freq: Once | INTRAVENOUS | Status: AC | PRN
  Administered 2018-06-02: 20 mL via INTRAVENOUS

## 2018-06-25 ENCOUNTER — Telehealth: Payer: Self-pay | Admitting: Neurology

## 2018-06-25 NOTE — Telephone Encounter (Signed)
Received fax from [facility] stating [facility] has been placed on hold due to inability to reach patient.

## 2018-07-01 ENCOUNTER — Ambulatory Visit: Payer: BLUE CROSS/BLUE SHIELD | Admitting: Endocrinology

## 2018-07-09 ENCOUNTER — Telehealth: Payer: Self-pay | Admitting: Neurology

## 2018-07-09 NOTE — Telephone Encounter (Signed)
Patient needs to talk to someone about his medication. He states that he had surgery or is going to have surgery.

## 2018-07-09 NOTE — Telephone Encounter (Signed)
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 He is interested in surgery for the future so he will keep appt on Monday.

## 2018-07-11 NOTE — Progress Notes (Signed)
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 about this.  CT done following this didn't confirm this.  Regardless, I am not sure what else can be done.  He does state that once jardiance d/c he is somewhat better 5. Rhinorrhea  -possible CSF leak.  Being w/u by ENT. 6.  Much greater than 50% of this visit was spent in counseling and coordinating care.  Total face to face time:  60 min, which didn't include the non face to face time waiting for carbidopa/levodopa to kick in

## 2018-07-14 ENCOUNTER — Encounter: Payer: Self-pay | Admitting: Neurology

## 2018-07-14 ENCOUNTER — Telehealth: Payer: Self-pay | Admitting: Neurology

## 2018-07-14 ENCOUNTER — Ambulatory Visit (INDEPENDENT_AMBULATORY_CARE_PROVIDER_SITE_OTHER): Payer: Medicare Other | Admitting: Neurology

## 2018-07-14 VITALS — BP 112/64 | HR 56 | Ht 71.0 in | Wt 209.0 lb

## 2018-07-14 DIAGNOSIS — G20 Parkinson's disease: Secondary | ICD-10-CM

## 2018-07-14 MED ORDER — CARBIDOPA-LEVODOPA 25-100 MG PO TABS
3.5000 | ORAL_TABLET | Freq: Once | ORAL | Status: AC
  Administered 2018-07-14: 3.5 via ORAL

## 2018-07-14 NOTE — Telephone Encounter (Signed)
Referral to [facility] faxed to [phone] with confirmation received. They will contact patient directly with appt date/time.

## 2018-07-22 ENCOUNTER — Telehealth: Payer: Self-pay | Admitting: Neurology

## 2018-07-22 NOTE — Telephone Encounter (Signed)
Received note from [facility] that patient is scheduled for Neuropsych and should be finished with a feedback appt on 09/11/18.

## 2018-07-26 ENCOUNTER — Other Ambulatory Visit: Payer: Self-pay | Admitting: Neurology

## 2018-07-31 ENCOUNTER — Telehealth: Payer: Self-pay | Admitting: Endocrinology

## 2018-07-31 ENCOUNTER — Other Ambulatory Visit: Payer: Self-pay | Admitting: Emergency Medicine

## 2018-07-31 MED ORDER — GLUCOSE BLOOD VI STRP: ORAL_STRIP | 3 refills | Status: DC

## 2018-07-31 NOTE — Telephone Encounter (Signed)
Clydie BraunKaren following up on behalf of pt called re: requests for RX for test strips be sent to [facility] in [place]-2nd request

## 2018-08-01 ENCOUNTER — Other Ambulatory Visit: Payer: Self-pay

## 2018-08-01 MED ORDER — GLUCOSE BLOOD VI STRP: ORAL_STRIP | 12 refills | Status: DC

## 2018-08-01 MED ORDER — CONTOUR NEXT ONE KIT: 1.0000 | PACK | Freq: Every day | 0 refills | Status: DC

## 2018-08-01 NOTE — Telephone Encounter (Signed)
Sent today

## 2018-08-05 ENCOUNTER — Encounter

## 2018-08-05 ENCOUNTER — Ambulatory Visit: Payer: BLUE CROSS/BLUE SHIELD | Admitting: Neurology

## 2018-08-08 ENCOUNTER — Encounter: Payer: Self-pay | Admitting: Endocrinology

## 2018-08-08 ENCOUNTER — Ambulatory Visit (INDEPENDENT_AMBULATORY_CARE_PROVIDER_SITE_OTHER): Payer: Medicare Other | Admitting: Endocrinology

## 2018-08-08 VITALS — BP 120/70 | HR 59 | Ht 71.0 in | Wt 215.6 lb

## 2018-08-08 DIAGNOSIS — E1165 Type 2 diabetes mellitus with hyperglycemia: Secondary | ICD-10-CM

## 2018-08-08 LAB — POCT GLYCOSYLATED HEMOGLOBIN (HGB A1C): Hemoglobin A1C: 7 % — AB (ref 4.0–5.6)

## 2018-08-08 MED ORDER — BROMOCRIPTINE MESYLATE 2.5 MG PO TABS: ORAL_TABLET | ORAL | 11 refills | Status: DC

## 2018-08-08 NOTE — Patient Instructions (Addendum)
I have sent a prescription to your pharmacy, to add "bromocriptine."   Please continue the same metformin. check your blood sugar once a day.  vary the time of day when you check, between before the 3 meals, and at bedtime.  also check if you have symptoms of your blood sugar being too high or too low.  please keep a record of the readings and bring it to your next appointment here (or you can bring the meter itself).  You can write it on any piece of paper.  please call us sooner if your blood sugar goes below 70, or if you have a lot of readings over 200.  Please come back for a follow-up appointment in 4-5 months.

## 2018-08-08 NOTE — Progress Notes (Signed)
Subjective:    Patient ID: Sean Barajas, male    DOB: 1956-03-20, 62 y.o.   MRN: [id number]  HPI Pt returns for f/u of diabetes mellitus:  DM type: 2 Dx'ed: 5830 Complications: CAD Therapy: metformin DKA: never Severe hypoglycemia: never.   Pancreatitis: never Pancreatic imaging: normal on 2018 CT.  Other: he has never been on insulin; edema limits rx options; he did not tolerate jardiance or januvia (sensation in throat--he had stricture dilated in 2018).   Interval history: nausea is resolved.  pt states he feels well in general.  He says cbg's are well-controlled.  Past Medical History:  Diagnosis Date  . Anxiety   . Coronary artery disease   . Hyperlipidemia   . Hypertension   . Parkinson's disease ([place])   . Prediabetes     Past Surgical History:  Procedure Laterality Date  . APPENDECTOMY    . BALLOON DILATION N/A 11/01/2017   Procedure: BALLOON DILATION;  Surgeon: Manya Silvas, MD;  Location: [facility] ENDOSCOPY;  Service: Endoscopy;  Laterality: N/A;  . CARDIAC CATHETERIZATION    . CAROTID STENT  07/2015  . CHOLECYSTECTOMY    . CORONARY ANGIOPLASTY    . CORONARY ARTERY BYPASS GRAFT    . CORONARY STENT INTERVENTION N/A 01/30/2017   Procedure: Coronary Stent Intervention;  Surgeon: Isaias Cowman, MD;  Location: [facility];  Service: Cardiovascular;  Laterality: N/A;  . CORONARY STENT INTERVENTION N/A 06/13/2017   Procedure: Coronary Stent Intervention;  Surgeon: Wellington Hampshire, MD;  Location: [facility];  Service: Cardiovascular;  Laterality: N/A;  . CORONARY STENT INTERVENTION N/A 01/06/2018   Procedure: CORONARY STENT INTERVENTION;  Surgeon: Isaias Cowman, MD;  Location: [facility];  Service: Cardiovascular;  Laterality: N/A;  . ESOPHAGOGASTRODUODENOSCOPY (EGD) WITH PROPOFOL N/A 11/01/2017   Procedure: ESOPHAGOGASTRODUODENOSCOPY (EGD) WITH PROPOFOL;  Surgeon: Manya Silvas, MD;  Location: [facility] ENDOSCOPY;  Service:  Endoscopy;  Laterality: N/A;  . EYE SURGERY Bilateral   . LEFT HEART CATH AND CORS/GRAFTS ANGIOGRAPHY N/A 01/30/2017   Procedure: Left Heart Cath and Cors/Grafts Angiography and possible PCI;  Surgeon: Isaias Cowman, MD;  Location: [facility];  Service: Cardiovascular;  Laterality: N/A;  . LEFT HEART CATH AND CORS/GRAFTS ANGIOGRAPHY N/A 06/13/2017   Procedure: Left Heart Cath and Cors/Grafts Angiography;  Surgeon: Corey Skains, MD;  Location: [facility];  Service: Cardiovascular;  Laterality: N/A;  . LEFT HEART CATH AND CORS/GRAFTS ANGIOGRAPHY N/A 01/06/2018   Procedure: LEFT HEART CATH AND CORS/GRAFTS ANGIOGRAPHY;  Surgeon: Isaias Cowman, MD;  Location: [facility];  Service: Cardiovascular;  Laterality: N/A;  . NASAL SEPTUM SURGERY    . TONSILLECTOMY      Social History   Socioeconomic History  . Marital status: Single    Spouse name: Not on file  . Number of children: Not on file  . Years of education: Not on file  . Highest education level: Not on file  Occupational History  . Occupation: self employed    Comment: Water engineer  Social Needs  . Financial resource strain: Not on file  . Food insecurity:    Worry: Not on file    Inability: Not on file  . Transportation needs:    Medical: Not on file    Non-medical: Not on file  Tobacco Use  . Smoking status: Never Smoker  . Smokeless tobacco: Never Used  Substance and Sexual Activity  . Alcohol use: No  Alcohol/week: 0.0 standard drinks    Comment: 1 glass wine/month  . Drug use: No    Comment: hemp oil  . Sexual activity: Not on file  Lifestyle  . Physical activity:    Days per week: Not on file    Minutes per session: Not on file  . Stress: Not on file  Relationships  . Social connections:    Talks on phone: Not on file    Gets together: Not on file    Attends religious service: Not on file    Active member of club or organization: Not on file    Attends meetings  of clubs or organizations: Not on file    Relationship status: Not on file  . Intimate partner violence:    Fear of current or ex partner: Not on file    Emotionally abused: Not on file    Physically abused: Not on file    Forced sexual activity: Not on file  Other Topics Concern  . Not on file  Social History Narrative  . Not on file    Current Outpatient Medications on File Prior to Visit  Medication Sig Dispense Refill  . acetaminophen (TYLENOL) 500 MG tablet Take 500 mg by mouth every 6 (six) hours as needed for mild pain.     Marland Kitchen albuterol (PROVENTIL HFA;VENTOLIN HFA) 108 (90 BASE) MCG/ACT inhaler Inhale 2 puffs into the lungs every 6 (six) hours as needed for wheezing or shortness of breath.    . ALPRAZolam (XANAX) 0.5 MG tablet Take 0.25-0.5 mg by mouth 2 (two) times daily. Dr Melrose Nakayama    . ARTIFICIAL TEAR OP Apply 1 drop to eye as needed (for dry eyes).    Marland Kitchen aspirin EC 81 MG tablet Take 81 mg by mouth daily.    . Blood Glucose Monitoring Suppl (CONTOUR NEXT ONE) KIT 1 kit by Does not apply route daily. 1 kit 0  . carbidopa-levodopa (SINEMET CR) 50-200 MG tablet Take 1 tablet by mouth at bedtime. 90 tablet 1  . carbidopa-levodopa (SINEMET IR) 25-100 MG tablet Take 3 tablets by mouth 4 (four) times daily. 1080 tablet 1  . carvedilol (COREG) 6.25 MG tablet Take 6.25 mg by mouth 2 (two) times daily.     . fluticasone (FLONASE) 50 MCG/ACT nasal spray Place 2 sprays into both nostrils daily. 16 g 6  . glucose blood (CONTOUR NEXT TEST) test strip Use as instructed to test blood sugar once daily 100 each 12  . glucose blood test strip 1 each by Other route daily. And lancets 1/day 100 each 12  . isosorbide mononitrate (IMDUR) 30 MG 24 hr tablet Take 1 tablet (30 mg total) by mouth daily. (Patient taking differently: Take 60 mg by mouth daily. ) 30 tablet 0  . Levodopa (INBRIJA) 42 MG CAPS Place into inhaler and inhale.    . lisinopril (ZESTRIL) 2.5 MG tablet Take 1 tablet (2.5 mg total) by  mouth daily. 30 tablet 0  . metFORMIN (GLUCOPHAGE) 500 MG tablet Take 2 tablets (1,000 mg total) by mouth 2 (two) times daily with a meal. 360 tablet 3  . nitroGLYCERIN (NITROSTAT) 0.4 MG SL tablet Place 0.4 mg under the tongue every 5 (five) minutes as needed for chest pain.    . pantoprazole (PROTONIX) 40 MG tablet Take 40 mg by mouth 2 (two) times daily.    . phenol (CHLORASEPTIC) 1.4 % LIQD Use as directed 1 spray in the mouth or throat as needed for throat irritation / pain.    Marland Kitchen  promethazine (PHENERGAN) 25 MG tablet Take 25 mg by mouth every 8 (eight) hours as needed for nausea or vomiting.    . rosuvastatin (CRESTOR) 20 MG tablet Take 20 mg by mouth at bedtime.    . rotigotine (NEUPRO) 4 MG/24HR Place onto the skin daily.    . ticagrelor (BRILINTA) 90 MG TABS tablet Take 90 mg by mouth every 12 (twelve) hours.    . valACYclovir (VALTREX) 500 MG tablet Take 500 mg by mouth 2 (two) times daily.     No current facility-administered medications on file prior to visit.     Allergies  Allergen Reactions  . Iodinated Diagnostic Agents Nausea Only and Other (See Comments)    Reaction:  Fever   . Sulfa Antibiotics Rash    Family History  Problem Relation Age of Onset  . Breast cancer Mother   . Diabetes Father   . CAD Father   . CVA Father   . Diabetes Brother     BP 120/70 (BP Location: Left Arm)   Pulse (!) 59   Ht _0  (1.803 m)   Wt 215 lb 9.6 oz (97.8 kg)   SpO2 97%   BMI 30.07 kg/m    Review of Systems He denies hypoglycemia    Objective:   Physical Exam VITAL SIGNS:  See vs page GENERAL: no distress Pulses: dorsalis pedis intact bilat.   MSK: no deformity of the feet [id number] bilat leg edema Skin:  no ulcer on the feet.  normal color and temp on the feet. Neuro: sensation is intact to touch on the feet   Lab Results  Component Value Date   HGBA1C 7.0 (A) 08/08/2018       Assessment & Plan:  Type 2 DM, with CAD: he needs increased rx Edema: this  limits rx options  Patient Instructions  I have sent a prescription to your pharmacy, to add "bromocriptine."   Please continue the same metformin. check your blood sugar once a day.  vary the time of day when you check, between before the 3 meals, and at bedtime.  also check if you have symptoms of your blood sugar being too high or too low.  please keep a record of the readings and bring it to your next appointment here (or you can bring the meter itself).  You can write it on any piece of paper.  please call us sooner if your blood sugar goes below 70, or if you have a lot of readings over 200.  Please come back for a follow-up appointment in 4-5 months.

## 2018-08-29 ENCOUNTER — Telehealth: Payer: Self-pay | Admitting: Neurology

## 2018-08-29 NOTE — Telephone Encounter (Signed)
Received a copy of neuropsych testing done by Dr. Leonides Schanz in [place].  Patient did not have evidence of dementia.  Patient had evidence of mild cognitive impairment.  Biggest concern on the testing appeared to be psychosocial stressors identified within the patient.  Biggest stressor was an ongoing interpersonal stress with his girlfriend of 5 years.  Dr. ward felt that there were concerns regarding the accuracy of the girlfriends report, as his sister and his friend reported contrasting information.  Dr. Leonides Schanz did state that his girlfriend would likely not be the one providing postoperative care, but rather the sister and his friend/employee.  In the body of the report, I did note that the patient told Dr. Leonides Schanz that his girlfriend thought that he and I had an inappropriate relationship.  Of note, I have never met the patient's girlfriend and she has never attended a visit here.  We have discussed their stressful relationship as pt told me that she threw away his neupro (which was corroborated by girlfriend at neuropsych testing).  Girlfriend thought that it made him aggressive but pt and sister and employee denied that.  Luvenia Starch, make sure pt has a f/u to discuss whether or not to proceed.  Will certainly need cardiac clearance.

## 2018-09-01 NOTE — Telephone Encounter (Signed)
Mychart message sent to patient.

## 2018-09-23 NOTE — Progress Notes (Deleted)
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  We discusssed that candy like lemon drops can help by stimulating mm of the oropharynx to induce swallowing. 3.  Constipation  -copy of [place] recipe given 4.  Dysphagia  - Patient had a modified barium swallow on 05/23/2017 that demonstrated mild-moderate oropharyngeal dysphagia and mild esophageal dysphagia.  Dysphagia 3 (Mech soft) solids;Thin liquid;Other (small sips, chin tuck) was recommended.  -has to sleep sitting up and won't stretch esophagus until off brilinta and that will be another year (August, 2018).    -Patient had endoscopy done since last visit demonstrating that cardiac structures are pushing on the esophagus which are the likely source of dysphagia.  I do not think that there is likely anything that we can do about this.  CT done following this didn't confirm this.  Regardless, I am not sure what else can be done.  He does state that once jardiance d/c he is somewhat better 5. Rhinorrhea  -possible CSF leak.  Being w/u by ENT. 6.  ***

## 2018-09-25 ENCOUNTER — Other Ambulatory Visit: Payer: Self-pay | Admitting: Neurology

## 2018-09-25 ENCOUNTER — Ambulatory Visit: Payer: Medicare Other | Admitting: Neurology

## 2018-09-30 NOTE — Progress Notes (Signed)
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 manufacturers for DBS.  Ultimately, decided on the [place].  We will start on the L brain.  -Discussed that the patient would need cardiac clearance.  I think he is fairly high risk for surgery.  He understands that, but also states that he does not want to continue to live like this.  We also need to decide whether to do it all on one surgery (battery and electrode) given need to be off of brilinta.  Will talk with Dr. Vertell Limber about this.   2.  Sialorrhea  -This is commonly associated with PD.  We talked about treatments.  The patient is not a candidate for oral anticholinergic therapy because of increased risk of confusion and falls.  We discussed Botox (type A and B) and 1% atropine drops.  We discusssed that candy like lemon drops can help by stimulating mm of the oropharynx to induce swallowing. 3.  Constipation  -copy of [place] recipe given 4.  Dysphagia  - Patient had a modified barium swallow on 05/23/2017 that demonstrated mild-moderate oropharyngeal dysphagia and mild esophageal dysphagia.  Dysphagia 3 (Mech soft) solids;Thin liquid;Other (small sips, chin tuck) was recommended.  -has to sleep sitting up and won't stretch esophagus until off brilinta and that will be another year (August, 2018).    -Patient had  endoscopy done since last visit demonstrating that cardiac structures are pushing on the esophagus which are the likely source of dysphagia.  I do not think that there is likely anything that we can do about this.  CT done following this didn't confirm this.  Regardless, I am not sure what else can be done.  He does state that once jardiance d/c he is somewhat better 5. Rhinorrhea  -Turned out to be a CSF leak and resolved once repaired. 6.  We will see the patient back after his referral to Dr. Vertell Limber.  Much greater than 50% of this visit was spent in counseling and coordinating care.  Total face to face time:  40 min

## 2018-10-01 ENCOUNTER — Encounter: Payer: Self-pay | Admitting: Neurology

## 2018-10-01 ENCOUNTER — Ambulatory Visit (INDEPENDENT_AMBULATORY_CARE_PROVIDER_SITE_OTHER): Payer: Medicare Other | Admitting: Neurology

## 2018-10-01 VITALS — BP 110/70 | HR 62 | Ht 71.0 in | Wt 219.0 lb

## 2018-10-01 DIAGNOSIS — G20 Parkinson's disease: Secondary | ICD-10-CM | POA: Diagnosis not present

## 2018-10-02 ENCOUNTER — Telehealth: Payer: Self-pay | Admitting: Neurology

## 2018-10-02 NOTE — Telephone Encounter (Signed)
Referral faxed to Dr. Venetia Maxon on 10/01/18 with confirmation received to [phone].

## 2018-10-17 ENCOUNTER — Telehealth: Payer: Self-pay | Admitting: Neurology

## 2018-10-17 NOTE — Telephone Encounter (Signed)
Patient is calling in stating he needs a letter stating that he is capable of driving for the DMV. Please call [phone] when ready. Thanks!

## 2018-10-17 NOTE — Telephone Encounter (Signed)
We can send a letter stating that he had neurocognitive testing that showed no evidence of cognitive impairment that should influence driving.  If they are worried about physical aspects, will need driving eval.

## 2018-10-17 NOTE — Telephone Encounter (Signed)
Is this something we would do?

## 2018-10-17 NOTE — Telephone Encounter (Signed)
Tried to call patient back to make him aware. No answer. VM full.

## 2018-10-20 ENCOUNTER — Encounter: Payer: Self-pay | Admitting: Neurology

## 2018-10-20 NOTE — Telephone Encounter (Signed)
Spoke with patient and made aware we can only comment on the cognitive functioning. He states that should be okay. 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 Letter at front for patient to pick up per his request.

## 2019-01-01 ENCOUNTER — Other Ambulatory Visit: Payer: Self-pay | Admitting: Neurology

## 2019-01-14 ENCOUNTER — Ambulatory Visit (INDEPENDENT_AMBULATORY_CARE_PROVIDER_SITE_OTHER): Payer: Medicare HMO | Admitting: Endocrinology

## 2019-01-14 ENCOUNTER — Encounter: Payer: Self-pay | Admitting: Endocrinology

## 2019-01-14 VITALS — BP 140/82 | HR 57 | Ht 71.0 in | Wt 231.4 lb

## 2019-01-14 DIAGNOSIS — E1165 Type 2 diabetes mellitus with hyperglycemia: Secondary | ICD-10-CM | POA: Diagnosis not present

## 2019-01-14 LAB — POCT GLYCOSYLATED HEMOGLOBIN (HGB A1C): HEMOGLOBIN A1C: 7.7 % — AB (ref 4.0–5.6)

## 2019-01-14 MED ORDER — BROMOCRIPTINE MESYLATE 2.5 MG PO TABS: ORAL_TABLET | ORAL | 11 refills | Status: DC

## 2019-01-14 NOTE — Progress Notes (Signed)
Subjective:    Patient ID: Sean Barajas, male    DOB: 1956/03/11, 63 y.o.   MRN: [id number]  HPI Pt returns for f/u of diabetes mellitus:  DM type: 2 Dx'ed: 4128 Complications: CAD Therapy: metformin DKA: never Severe hypoglycemia: never.   Pancreatitis: never Pancreatic imaging: normal on 2018 CT.  Other: he has never been on insulin; edema limits rx options; he did not tolerate jardiance or januvia (sensation in throat--he had stricture dilated in 2018).   Interval history: pt says pharmacy did not fill the bromocriptine.  pt states he feels well in general.  He says cbg's are well-controlled.   Past Medical History:  Diagnosis Date  . Anxiety   . Coronary artery disease   . Hyperlipidemia   . Hypertension   . Parkinson's disease ([place])   . Prediabetes     Past Surgical History:  Procedure Laterality Date  . APPENDECTOMY    . BALLOON DILATION N/A 11/01/2017   Procedure: BALLOON DILATION;  Surgeon: Manya Silvas, MD;  Location[facility] ENDOSCOPY;  Service: Endoscopy;  Laterality: N/A;  . CARDIAC CATHETERIZATION    . CAROTID STENT  07/2015  . CHOLECYSTECTOMY    . CORONARY ANGIOPLASTY    . CORONARY ARTERY BYPASS GRAFT    . CORONARY STENT INTERVENTION N/A 01/30/2017   Procedure: Coronary Stent Intervention;  Surgeon: Isaias Cowman, MD;  Location: [place] CV LAB;  Service: Cardiovascular;  Laterality: N/A;  . CORONARY STENT INTERVENTION N/A 06/13/2017   Procedure: Coronary Stent Intervention;  Surgeon: Wellington Hampshire, MD;  Location: [place] [facility];  Service: Cardiovascular;  Laterality: N/A;  . CORONARY STENT INTERVENTION N/A 01/06/2018   Procedure: CORONARY STENT INTERVENTION;  Surgeon: Isaias Cowman, MD;  Location: [place] [facility];  Service: Cardiovascular;  Laterality: N/A;  . ESOPHAGOGASTRODUODENOSCOPY (EGD) WITH PROPOFOL N/A 11/01/2017   Procedure: ESOPHAGOGASTRODUODENOSCOPY (EGD) WITH PROPOFOL;  Surgeon: Manya Silvas, MD;   Location: [facility] ENDOSCOPY;  Service: Endoscopy;  Laterality: N/A;  . EYE SURGERY Bilateral   . LEFT HEART CATH AND CORS/GRAFTS ANGIOGRAPHY N/A 01/30/2017   Procedure: Left Heart Cath and Cors/Grafts Angiography and possible PCI;  Surgeon: Isaias Cowman, MD;  Location: [place] [facility];  Service: Cardiovascular;  Laterality: N/A;  . LEFT HEART CATH AND CORS/GRAFTS ANGIOGRAPHY N/A 06/13/2017   Procedure: Left Heart Cath and Cors/Grafts Angiography;  Surgeon: Corey Skains, MD;  Location: [place] [facility];  Service: Cardiovascular;  Laterality: N/A;  . LEFT HEART CATH AND CORS/GRAFTS ANGIOGRAPHY N/A 01/06/2018   Procedure: LEFT HEART CATH AND CORS/GRAFTS ANGIOGRAPHY;  Surgeon: Isaias Cowman, MD;  Location: [place] [facility] LAB;  Service: Cardiovascular;  Laterality: N/A;  . NASAL SEPTUM SURGERY    . TONSILLECTOMY      Social History   Socioeconomic History  . Marital status: Single    Spouse name: Not on file  . Number of children: Not on file  . Years of education: Not on file  . Highest education level: Not on file  Occupational History  . Occupation: self employed    Comment: Water engineer  Social Needs  . Financial resource strain: Not on file  . Food insecurity:    Worry: Not on file    Inability: Not on file  . Transportation needs:    Medical: Not on file    Non-medical: Not on file  Tobacco Use  . Smoking status: Never Smoker  . Smokeless tobacco: Never Used  Substance and Sexual Activity  .  Alcohol use: No    Alcohol/week: 0.0 standard drinks    Comment: 1 glass wine/month  . Drug use: No    Comment: hemp oil  . Sexual activity: Not on file  Lifestyle  . Physical activity:    Days per week: Not on file    Minutes per session: Not on file  . Stress: Not on file  Relationships  . Social connections:    Talks on phone: Not on file    Gets together: Not on file    Attends religious service: Not on file    Active member of club or  organization: Not on file    Attends meetings of clubs or organizations: Not on file    Relationship status: Not on file  . Intimate partner violence:    Fear of current or ex partner: Not on file    Emotionally abused: Not on file    Physically abused: Not on file    Forced sexual activity: Not on file  Other Topics Concern  . Not on file  Social History Narrative  . Not on file    Current Outpatient Medications on File Prior to Visit  Medication Sig Dispense Refill  . acetaminophen (TYLENOL) 500 MG tablet Take 500 mg by mouth every 6 (six) hours as needed for mild pain.     Marland Kitchen albuterol (PROVENTIL HFA;VENTOLIN HFA) 108 (90 BASE) MCG/ACT inhaler Inhale 2 puffs into the lungs every 6 (six) hours as needed for wheezing or shortness of breath.    . ALPRAZolam (XANAX) 0.5 MG tablet Take 0.25-0.5 mg by mouth 2 (two) times daily. Dr Melrose Nakayama    . ARTIFICIAL TEAR OP Apply 1 drop to eye as needed (for dry eyes).    Marland Kitchen aspirin EC 81 MG tablet Take 81 mg by mouth daily.    . Blood Glucose Monitoring Suppl (CONTOUR NEXT ONE) KIT 1 kit by Does not apply route daily. 1 kit 0  . carbidopa-levodopa (SINEMET CR) 50-200 MG tablet Take 1 tablet by mouth at bedtime. 90 tablet 1  . carbidopa-levodopa (SINEMET IR) 25-100 MG tablet Take 3 tablets by mouth 4 (four) times daily. 360 tablet 5  . carvedilol (COREG) 6.25 MG tablet Take 6.25 mg by mouth 2 (two) times daily.     . fluticasone (FLONASE) 50 MCG/ACT nasal spray Place 2 sprays into both nostrils daily. 16 g 6  . glucose blood (CONTOUR NEXT TEST) test strip Use as instructed to test blood sugar once daily 100 each 12  . glucose blood test strip 1 each by Other route daily. And lancets 1/day 100 each 12  . isosorbide mononitrate (IMDUR) 30 MG 24 hr tablet Take 1 tablet (30 mg total) by mouth daily. (Patient taking differently: Take 60 mg by mouth daily. ) 30 tablet 0  . Levodopa (INBRIJA) 42 MG CAPS Place into inhaler and inhale.    . lisinopril (ZESTRIL)  2.5 MG tablet Take 1 tablet (2.5 mg total) by mouth daily. 30 tablet 0  . metFORMIN (GLUCOPHAGE) 500 MG tablet Take 2 tablets (1,000 mg total) by mouth 2 (two) times daily with a meal. 360 tablet 3  . nitroGLYCERIN (NITROSTAT) 0.4 MG SL tablet Place 0.4 mg under the tongue every 5 (five) minutes as needed for chest pain.    . pantoprazole (PROTONIX) 40 MG tablet Take 40 mg by mouth 2 (two) times daily.    . phenol (CHLORASEPTIC) 1.4 % LIQD Use as directed 1 spray in the mouth or throat as  needed for throat irritation / pain.    . promethazine (PHENERGAN) 25 MG tablet Take 25 mg by mouth every 8 (eight) hours as needed for nausea or vomiting.    . rosuvastatin (CRESTOR) 20 MG tablet Take 20 mg by mouth at bedtime.    . rotigotine (NEUPRO) 4 MG/24HR Place onto the skin daily.    . ticagrelor (BRILINTA) 90 MG TABS tablet Take 90 mg by mouth every 12 (twelve) hours.    . valACYclovir (VALTREX) 500 MG tablet Take 500 mg by mouth 2 (two) times daily.     No current facility-administered medications on file prior to visit.     Allergies  Allergen Reactions  . Iodinated Diagnostic Agents Nausea Only and Other (See Comments)    Reaction:  Fever   . Sulfa Antibiotics Rash    Family History  Problem Relation Age of Onset  . Breast cancer Mother   . Diabetes Father   . CAD Father   . CVA Father   . Diabetes Brother     BP 140/82 (BP Location: Left Arm, Patient Position: Sitting, Cuff Size: Large)   Pulse (!) 57   Ht '5\' 11"'$  (1.803 m)   Wt 231 lb 6.4 oz (105 kg)   SpO2 92%   BMI 32.27 kg/m    Review of Systems Denies nausea    Objective:   Physical Exam VITAL SIGNS:  See vs page GENERAL: no distress Pulses: dorsalis pedis intact bilat.   MSK: no deformity of the feet CV: trace bilat leg edema Skin:  no ulcer on the feet, but the skin is dry.  normal color and temp on the feet. Neuro: sensation is intact to touch on the feet  Lab Results  Component Value Date   HGBA1C 7.7 (A)  01/14/2019       Assessment & Plan:  Type 2 DM, with CAD: worse.  Edema: this limits rx options.   Patient Instructions  I have sent a prescription to your pharmacy, to add "bromocriptine."  If this pharmacy cannot get it, please call, so we can send it somewhere else.   Please also continue the same metformin.  check your blood sugar once a day.  vary the time of day when you check, between before the 3 meals, and at bedtime.  also check if you have symptoms of your blood sugar being too high or too low.  please keep a record of the readings and bring it to your next appointment here (or you can bring the meter itself).  You can write it on any piece of paper.  please call us sooner if your blood sugar goes below 70, or if you have a lot of readings over 200.  Please come back for a follow-up appointment in 3 months.

## 2019-01-14 NOTE — Patient Instructions (Addendum)
I have sent a prescription to your pharmacy, to add "bromocriptine."  If this pharmacy cannot get it, please call, so we can send it somewhere else.   Please also continue the same metformin.  check your blood sugar once a day.  vary the time of day when you check, between before the 3 meals, and at bedtime.  also check if you have symptoms of your blood sugar being too high or too low.  please keep a record of the readings and bring it to your next appointment here (or you can bring the meter itself).  You can write it on any piece of paper.  please call us sooner if your blood sugar goes below 70, or if you have a lot of readings over 200.  Please come back for a follow-up appointment in 3 months.

## 2019-01-15 ENCOUNTER — Telehealth: Payer: Self-pay | Admitting: Neurology

## 2019-01-15 NOTE — Telephone Encounter (Signed)
Called patient and made him aware we do not have a release on file to send records to [facility]. He would like the letter faxed to him directly at [phone]. Will send letter.

## 2019-01-15 NOTE — Telephone Encounter (Signed)
Needing the recent letter for driving faxed to their facility at [facility]. Please fax this information to [phone] and if you need to call them its [phone]. Thanks!

## 2019-01-16 NOTE — Telephone Encounter (Signed)
Letter faxed to [phone] with confirmation received.

## 2019-02-16 ENCOUNTER — Telehealth: Payer: Self-pay | Admitting: Neurology

## 2019-02-16 NOTE — Telephone Encounter (Signed)
Received phone call message from medical examiners office, Aliene Altes, at [phone] about pt ( no details given).  Did note that pt had died after a fall down flight of stairs and had intracranial bleed.  Had to leave message (no pt information given).  Left only my name and phone number for call back if they still needed me.

## 2019-03-04 DEATH — deceased

## 2019-03-25 IMAGING — RF DG ESOPHAGUS
12 of 19 series · 14 of 24 positions shown · non-contrast
Comparison: Chest x-ray 06/12/2017.

ADDENDUM:
The original report was by Dr. Manfil Nathaniel. The following
addendum is by Dr. Marina Ferrante:

Note is made on series 12 of tracheal aspiration of a small amount
of barium. This is not a surprising finding given the tracheal
aspiration shown on the prior swallowing function study of
05/23/2017.
CLINICAL DATA: Difficulty swallowing.
EXAM:
ESOPHOGRAM / BARIUM SWALLOW / BARIUM TABLET STUDY
TECHNIQUE: Combined double contrast and single contrast examination performed
using effervescent crystals, thick barium liquid, and thin barium
liquid. The patient was observed with fluoroscopy swallowing a 13 mm
barium sulphate tablet.
FLUOROSCOPY TIME:  Fluoroscopy Time:  3 minutes 12 seconds
Radiation Exposure Index (if provided by the fluoroscopic device):
73.9 mGy
Number of Acquired Spot Images: 53

[Series 1: fluoro_barium 2fps_bw · 0.18mm/px · 2 of 12 frames shown (1 of 12)]
[frame 2/12]
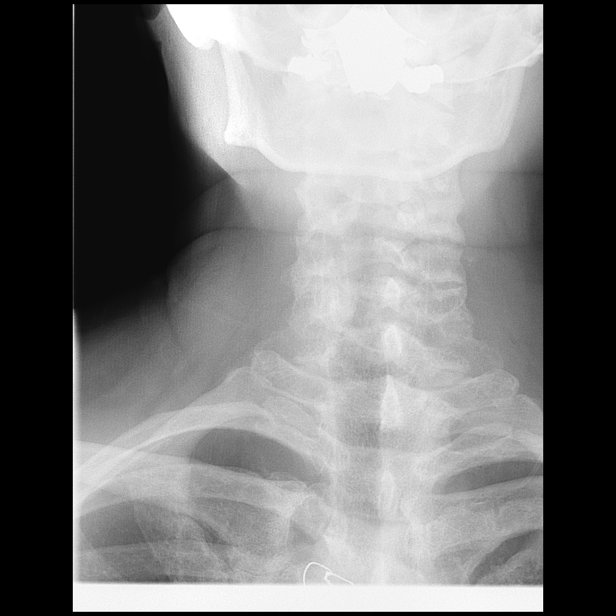
[frame 11/12]
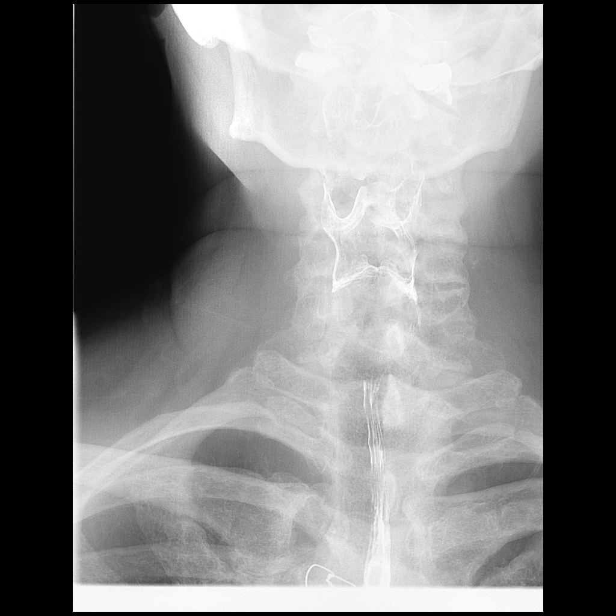

[Series 3: fluoro_barium 2fps_bw · 0.18mm/px · 1 of 5 frames shown (2 of 12)]
[frame 3/5]
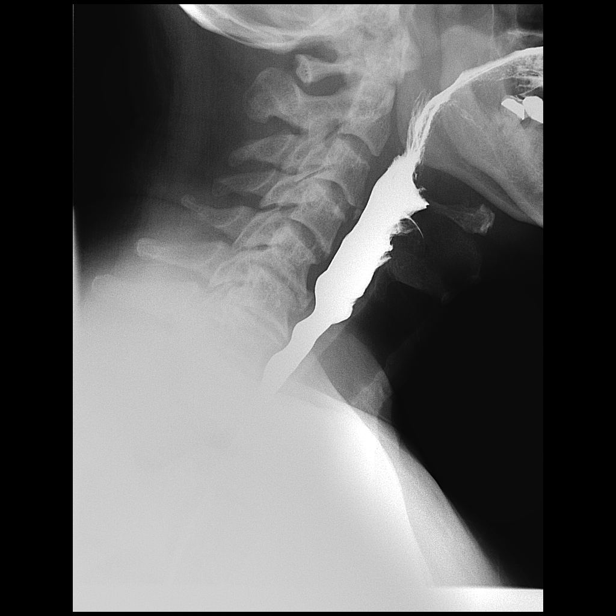

[Series 5: fluoro_barium 2fps_bw · 0.18mm/px · 2 of 12 frames shown (3 of 12)]
[frame 2/12]
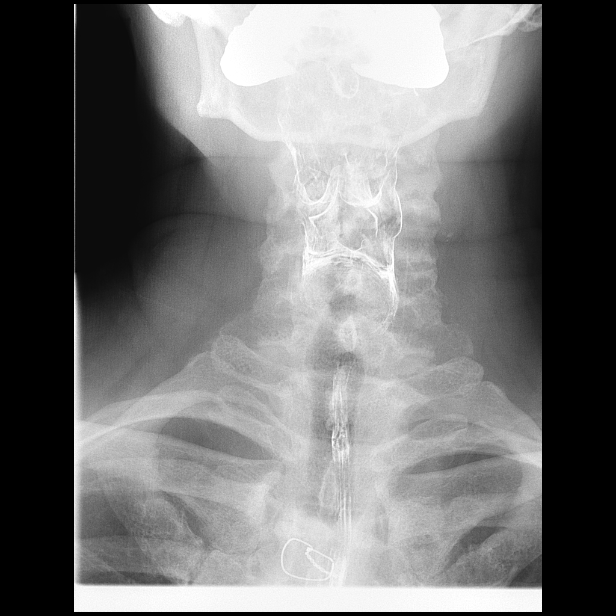
[frame 7/12]
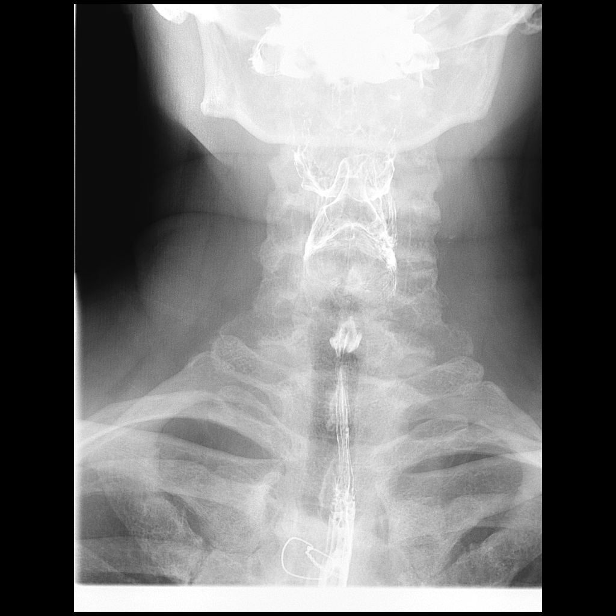

[Series 6: fluoro_barium 2fps_bw · 0.18mm/px · 1 of 1 slices shown (4 of 12)]
[im 1/1]
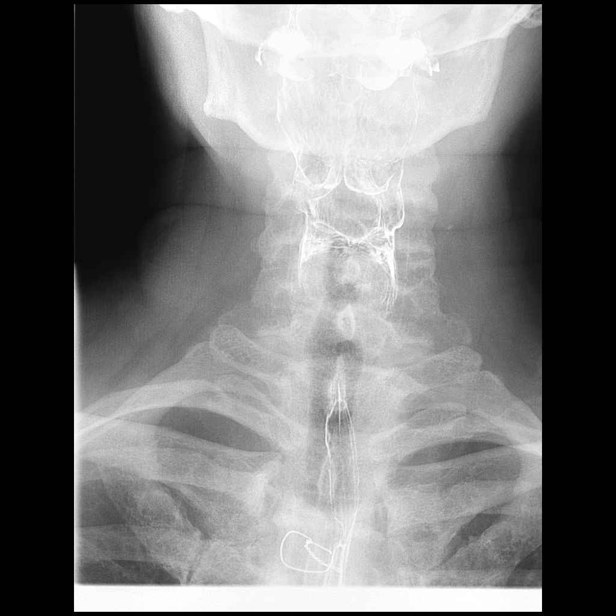

[Series 9: fluoro_barium 2fps_bw · 0.18mm/px · 1 of 1 slices shown (5 of 12)]
[im 1/1]
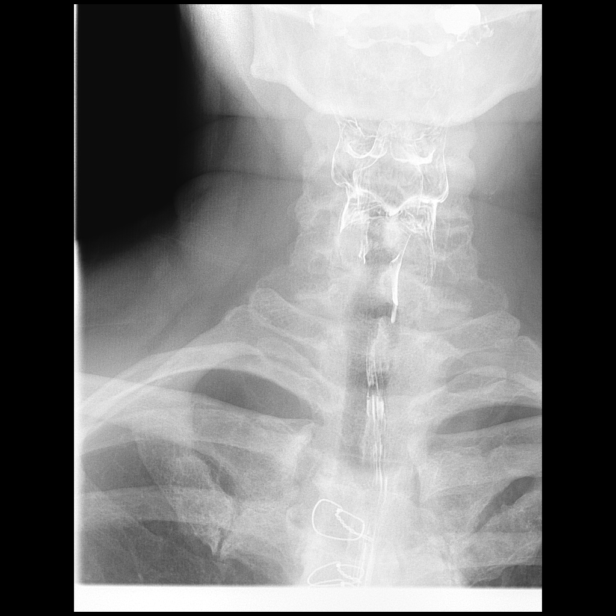

[Series 10: fluoro_barium 2fps_bw · 0.18mm/px · 1 of 1 slices shown (6 of 12)]
[im 1/1]
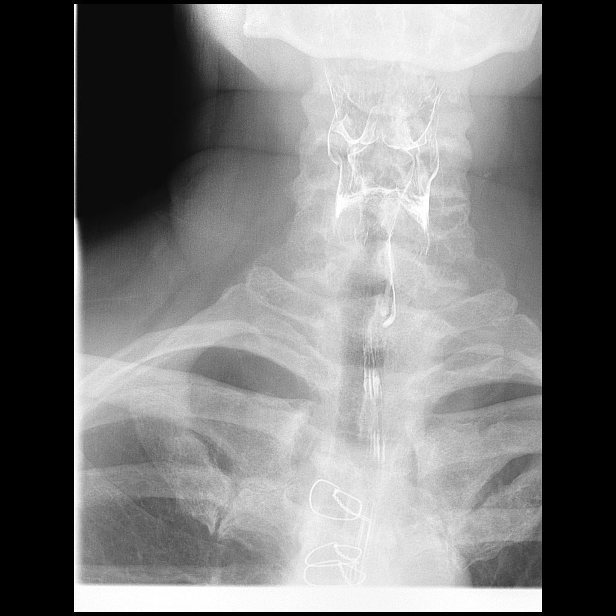

[Series 12: fluoro_barium 2fps_bw · 0.18mm/px · 1 of 1 slices shown (7 of 12)]
[im 1/1]
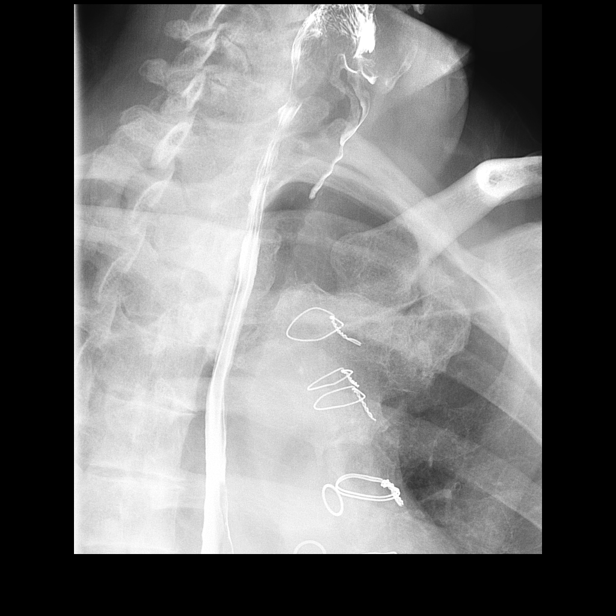

[Series 15: fluoro_barium 2fps_bw · 0.18mm/px · 1 of 1 slices shown (8 of 12)]
[im 1/1]
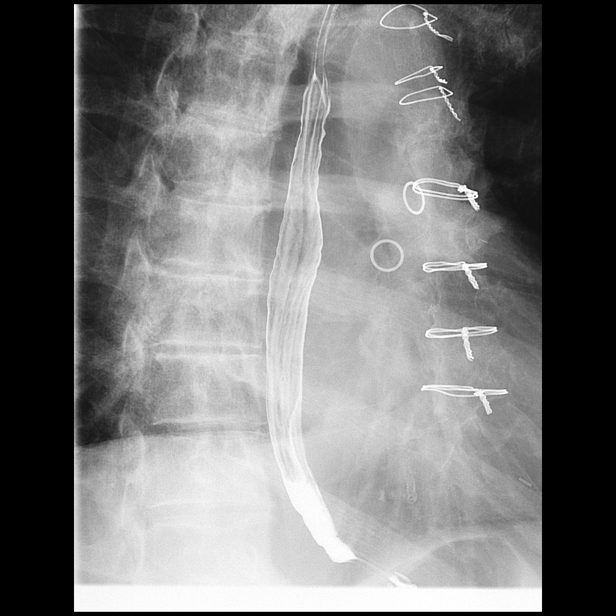

[Series 18: fluoro_barium 2fps_bw · 0.18mm/px · 1 of 1 slices shown (9 of 12)]
[im 1/1]
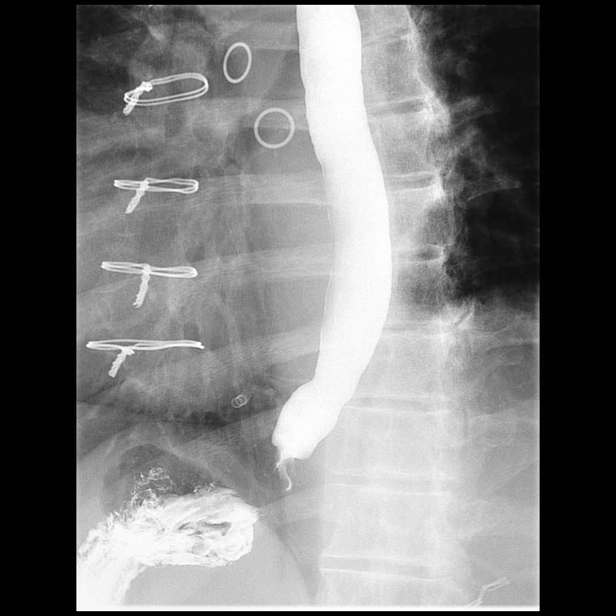

[Series 19: fluoro_barium 2fps_bw · 0.19mm/px · 1 of 1 slices shown (10 of 12)]
[im 1/1]
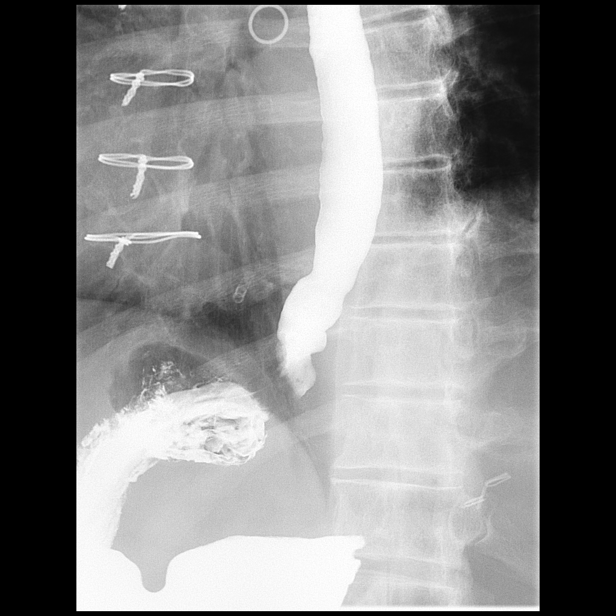

[Series 22: fluoro_barium 2fps_bw · 0.19mm/px · 1 of 2 frames shown (11 of 12)]
[frame 1/2]
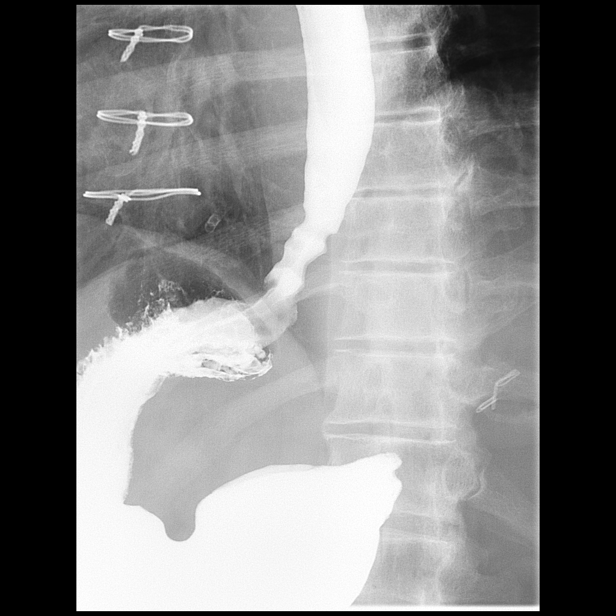

[Series 24: fluoro_barium 2fps_bw · 0.17mm/px · 1 of 1 slices shown (12 of 12)]
[im 1/1]
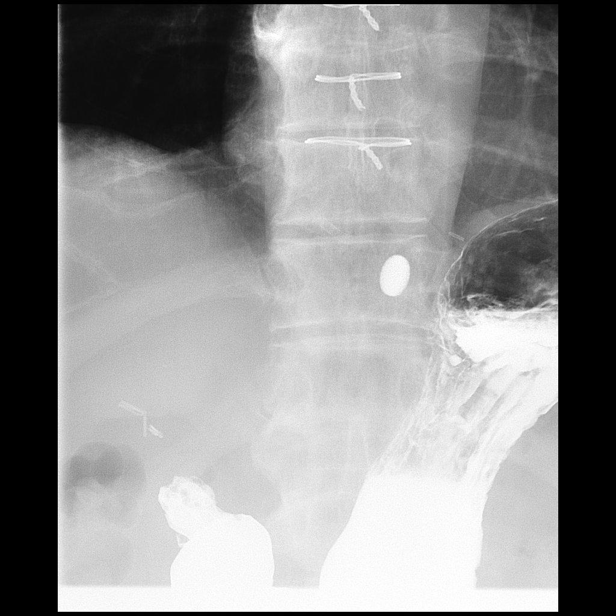

[14 of 24 positions shown; findings below may reference images not displayed]

FINDINGS: Minimal flattening of the right lateral piriform sinus noted. This
could be from positioning. However intrinsic and/or extrinsic
disease cannot be excluded. Endoscopic evaluation should be
considered. IV contrast-enhanced neck CT can be obtained to further
evaluate to exclude extrinsic disease. Slight deformity noted of the
posterior cervical esophagus secondary to prominent osteophytes.
Mild narrowing of the distal cervical esophagus is noted. This can
also be further evaluated with endoscopy. Small sliding hiatal
hernia. No reflux. Peristalsis normal. Standardized barium tablet
passes normally.
IMPRESSION: 1. Minimal flattening of the right lateral piriform sinus wall. This
could be from positioning. However intrinsic and/or extrinsic
disease cannot be excluded. Endoscopic evaluation should be
considered. IV contrast-enhanced neck CT can be obtained to further
evaluate to exclude extremely disease.

2. Slight deformity noted the posterior cervical esophagus secondary
prominent osteophytes.

3. Mild narrowing of the distal cervical esophagus is noted. This
can also be further evaluated with endoscopy.

4. Small sliding hiatal hernia. No reflux. Standardized barium
tablet passes normally .

## 2019-04-14 ENCOUNTER — Ambulatory Visit: Payer: Medicare HMO | Admitting: Endocrinology

## 2019-04-29 ENCOUNTER — Telehealth: Payer: Self-pay

## 2019-04-29 NOTE — Telephone Encounter (Signed)
NS 04/14/19. Called pt to reschedule appt. Unable to reach d/t no answer.
